# Patient Record
Sex: Female | Born: 1950
Health system: Southern US, Community
[De-identification: ages and names within clinical notes are randomized; demographics above are authoritative.]

## PROBLEM LIST (undated history)

## (undated) DIAGNOSIS — G4700 Insomnia, unspecified: Secondary | ICD-10-CM

## (undated) DIAGNOSIS — K219 Gastro-esophageal reflux disease without esophagitis: Secondary | ICD-10-CM

## (undated) DIAGNOSIS — M810 Age-related osteoporosis without current pathological fracture: Secondary | ICD-10-CM

## (undated) DIAGNOSIS — F329 Major depressive disorder, single episode, unspecified: Secondary | ICD-10-CM

## (undated) DIAGNOSIS — F32A Depression, unspecified: Secondary | ICD-10-CM

## (undated) DIAGNOSIS — J449 Chronic obstructive pulmonary disease, unspecified: Secondary | ICD-10-CM

## (undated) DIAGNOSIS — I10 Essential (primary) hypertension: Secondary | ICD-10-CM

## (undated) HISTORY — PX: BACK SURGERY: SHX140

## (undated) HISTORY — DX: Gastro-esophageal reflux disease without esophagitis: K21.9

## (undated) HISTORY — PX: COLONOSCOPY: SHX174

## (undated) HISTORY — PX: CHOLECYSTECTOMY: SHX55

## (undated) HISTORY — DX: Depression, unspecified: F32.A

## (undated) HISTORY — DX: Insomnia, unspecified: G47.00

## (undated) HISTORY — PX: APPENDECTOMY: SHX54

## (undated) HISTORY — PX: ABDOMINAL HYSTERECTOMY: SHX81

## (undated) HISTORY — DX: Essential (primary) hypertension: I10

## (undated) HISTORY — DX: Age-related osteoporosis without current pathological fracture: M81.0

## (undated) HISTORY — DX: Chronic obstructive pulmonary disease, unspecified: J44.9

---
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
## 2015-05-03 DIAGNOSIS — N200 Calculus of kidney: Secondary | ICD-10-CM

## 2015-05-03 HISTORY — DX: Calculus of kidney: N20.0

## 2015-06-12 DIAGNOSIS — N200 Calculus of kidney: Secondary | ICD-10-CM | POA: Diagnosis not present

## 2015-06-12 DIAGNOSIS — R1031 Right lower quadrant pain: Secondary | ICD-10-CM | POA: Diagnosis not present

## 2015-07-05 DIAGNOSIS — I10 Essential (primary) hypertension: Secondary | ICD-10-CM | POA: Diagnosis not present

## 2015-07-05 DIAGNOSIS — M545 Low back pain: Secondary | ICD-10-CM | POA: Diagnosis not present

## 2015-07-05 DIAGNOSIS — R251 Tremor, unspecified: Secondary | ICD-10-CM | POA: Diagnosis not present

## 2015-07-05 DIAGNOSIS — J410 Simple chronic bronchitis: Secondary | ICD-10-CM | POA: Diagnosis not present

## 2015-07-30 DIAGNOSIS — E782 Mixed hyperlipidemia: Secondary | ICD-10-CM | POA: Diagnosis not present

## 2015-07-30 DIAGNOSIS — I10 Essential (primary) hypertension: Secondary | ICD-10-CM | POA: Diagnosis not present

## 2015-08-16 DIAGNOSIS — J018 Other acute sinusitis: Secondary | ICD-10-CM | POA: Diagnosis not present

## 2015-08-28 DIAGNOSIS — B029 Zoster without complications: Secondary | ICD-10-CM | POA: Diagnosis not present

## 2015-08-28 DIAGNOSIS — E782 Mixed hyperlipidemia: Secondary | ICD-10-CM | POA: Diagnosis not present

## 2015-08-28 DIAGNOSIS — I10 Essential (primary) hypertension: Secondary | ICD-10-CM | POA: Diagnosis not present

## 2015-10-01 DIAGNOSIS — B029 Zoster without complications: Secondary | ICD-10-CM | POA: Diagnosis not present

## 2015-10-01 DIAGNOSIS — I10 Essential (primary) hypertension: Secondary | ICD-10-CM | POA: Diagnosis not present

## 2015-10-31 DIAGNOSIS — E782 Mixed hyperlipidemia: Secondary | ICD-10-CM | POA: Diagnosis not present

## 2015-10-31 DIAGNOSIS — I10 Essential (primary) hypertension: Secondary | ICD-10-CM | POA: Diagnosis not present

## 2015-11-01 DIAGNOSIS — E782 Mixed hyperlipidemia: Secondary | ICD-10-CM | POA: Diagnosis not present

## 2015-11-01 DIAGNOSIS — I10 Essential (primary) hypertension: Secondary | ICD-10-CM | POA: Diagnosis not present

## 2016-01-15 DIAGNOSIS — Z1231 Encounter for screening mammogram for malignant neoplasm of breast: Secondary | ICD-10-CM | POA: Diagnosis not present

## 2016-01-28 DIAGNOSIS — N3001 Acute cystitis with hematuria: Secondary | ICD-10-CM | POA: Diagnosis not present

## 2016-02-07 DIAGNOSIS — E782 Mixed hyperlipidemia: Secondary | ICD-10-CM | POA: Diagnosis not present

## 2016-02-07 DIAGNOSIS — R319 Hematuria, unspecified: Secondary | ICD-10-CM | POA: Diagnosis not present

## 2016-02-07 DIAGNOSIS — R3129 Other microscopic hematuria: Secondary | ICD-10-CM | POA: Diagnosis not present

## 2016-02-07 DIAGNOSIS — M545 Low back pain: Secondary | ICD-10-CM | POA: Diagnosis not present

## 2016-02-07 DIAGNOSIS — N201 Calculus of ureter: Secondary | ICD-10-CM | POA: Diagnosis not present

## 2016-02-07 DIAGNOSIS — I10 Essential (primary) hypertension: Secondary | ICD-10-CM | POA: Diagnosis not present

## 2016-02-07 DIAGNOSIS — N200 Calculus of kidney: Secondary | ICD-10-CM | POA: Diagnosis not present

## 2016-02-07 DIAGNOSIS — Z23 Encounter for immunization: Secondary | ICD-10-CM | POA: Diagnosis not present

## 2016-02-12 DIAGNOSIS — R109 Unspecified abdominal pain: Secondary | ICD-10-CM | POA: Diagnosis not present

## 2016-02-12 DIAGNOSIS — N202 Calculus of kidney with calculus of ureter: Secondary | ICD-10-CM | POA: Diagnosis not present

## 2016-02-12 DIAGNOSIS — N3289 Other specified disorders of bladder: Secondary | ICD-10-CM | POA: Diagnosis not present

## 2016-02-12 DIAGNOSIS — N200 Calculus of kidney: Secondary | ICD-10-CM | POA: Diagnosis not present

## 2016-02-19 DIAGNOSIS — N200 Calculus of kidney: Secondary | ICD-10-CM | POA: Diagnosis not present

## 2016-02-19 DIAGNOSIS — R1032 Left lower quadrant pain: Secondary | ICD-10-CM | POA: Diagnosis not present

## 2016-02-22 DIAGNOSIS — N200 Calculus of kidney: Secondary | ICD-10-CM | POA: Diagnosis not present

## 2016-02-22 DIAGNOSIS — I10 Essential (primary) hypertension: Secondary | ICD-10-CM | POA: Diagnosis not present

## 2016-02-22 DIAGNOSIS — Z79899 Other long term (current) drug therapy: Secondary | ICD-10-CM | POA: Diagnosis not present

## 2016-02-29 DIAGNOSIS — R1032 Left lower quadrant pain: Secondary | ICD-10-CM | POA: Diagnosis not present

## 2016-02-29 DIAGNOSIS — N200 Calculus of kidney: Secondary | ICD-10-CM | POA: Diagnosis not present

## 2016-04-11 DIAGNOSIS — R109 Unspecified abdominal pain: Secondary | ICD-10-CM | POA: Diagnosis not present

## 2016-04-11 DIAGNOSIS — I10 Essential (primary) hypertension: Secondary | ICD-10-CM | POA: Diagnosis not present

## 2016-04-11 DIAGNOSIS — N200 Calculus of kidney: Secondary | ICD-10-CM | POA: Diagnosis not present

## 2016-05-09 DIAGNOSIS — J441 Chronic obstructive pulmonary disease with (acute) exacerbation: Secondary | ICD-10-CM | POA: Diagnosis not present

## 2016-05-09 DIAGNOSIS — I10 Essential (primary) hypertension: Secondary | ICD-10-CM | POA: Diagnosis not present

## 2016-05-09 DIAGNOSIS — G250 Essential tremor: Secondary | ICD-10-CM | POA: Diagnosis not present

## 2016-05-09 DIAGNOSIS — Z23 Encounter for immunization: Secondary | ICD-10-CM | POA: Diagnosis not present

## 2016-05-09 DIAGNOSIS — E782 Mixed hyperlipidemia: Secondary | ICD-10-CM | POA: Diagnosis not present

## 2016-05-29 DIAGNOSIS — N201 Calculus of ureter: Secondary | ICD-10-CM | POA: Diagnosis not present

## 2016-05-29 DIAGNOSIS — R109 Unspecified abdominal pain: Secondary | ICD-10-CM | POA: Diagnosis not present

## 2016-08-15 DIAGNOSIS — E782 Mixed hyperlipidemia: Secondary | ICD-10-CM | POA: Diagnosis not present

## 2016-08-15 DIAGNOSIS — G250 Essential tremor: Secondary | ICD-10-CM | POA: Diagnosis not present

## 2016-08-15 DIAGNOSIS — I10 Essential (primary) hypertension: Secondary | ICD-10-CM | POA: Diagnosis not present

## 2016-12-11 DIAGNOSIS — Z122 Encounter for screening for malignant neoplasm of respiratory organs: Secondary | ICD-10-CM | POA: Diagnosis not present

## 2016-12-11 DIAGNOSIS — Z1231 Encounter for screening mammogram for malignant neoplasm of breast: Secondary | ICD-10-CM | POA: Diagnosis not present

## 2016-12-11 DIAGNOSIS — Z0001 Encounter for general adult medical examination with abnormal findings: Secondary | ICD-10-CM | POA: Diagnosis not present

## 2016-12-11 DIAGNOSIS — Z1211 Encounter for screening for malignant neoplasm of colon: Secondary | ICD-10-CM | POA: Diagnosis not present

## 2017-02-17 DIAGNOSIS — I10 Essential (primary) hypertension: Secondary | ICD-10-CM | POA: Diagnosis not present

## 2017-02-17 DIAGNOSIS — E782 Mixed hyperlipidemia: Secondary | ICD-10-CM | POA: Diagnosis not present

## 2017-02-17 DIAGNOSIS — Z23 Encounter for immunization: Secondary | ICD-10-CM | POA: Diagnosis not present

## 2017-03-18 LAB — HM DEXA SCAN

## 2017-04-07 DIAGNOSIS — Z1211 Encounter for screening for malignant neoplasm of colon: Secondary | ICD-10-CM | POA: Diagnosis not present

## 2017-04-09 LAB — COLOGUARD: Cologuard: NEGATIVE

## 2017-07-13 DIAGNOSIS — J9601 Acute respiratory failure with hypoxia: Secondary | ICD-10-CM | POA: Diagnosis not present

## 2017-07-13 DIAGNOSIS — J440 Chronic obstructive pulmonary disease with acute lower respiratory infection: Secondary | ICD-10-CM | POA: Diagnosis not present

## 2017-07-13 DIAGNOSIS — J1100 Influenza due to unidentified influenza virus with unspecified type of pneumonia: Secondary | ICD-10-CM | POA: Diagnosis not present

## 2017-07-13 DIAGNOSIS — J42 Unspecified chronic bronchitis: Secondary | ICD-10-CM | POA: Diagnosis not present

## 2017-07-13 DIAGNOSIS — Z79899 Other long term (current) drug therapy: Secondary | ICD-10-CM | POA: Diagnosis not present

## 2017-07-13 DIAGNOSIS — R05 Cough: Secondary | ICD-10-CM | POA: Diagnosis not present

## 2017-07-13 DIAGNOSIS — I10 Essential (primary) hypertension: Secondary | ICD-10-CM | POA: Diagnosis not present

## 2017-07-13 DIAGNOSIS — R0602 Shortness of breath: Secondary | ICD-10-CM | POA: Diagnosis not present

## 2017-07-13 DIAGNOSIS — J181 Lobar pneumonia, unspecified organism: Secondary | ICD-10-CM | POA: Diagnosis not present

## 2017-07-13 DIAGNOSIS — Z880 Allergy status to penicillin: Secondary | ICD-10-CM | POA: Diagnosis not present

## 2017-07-13 DIAGNOSIS — E785 Hyperlipidemia, unspecified: Secondary | ICD-10-CM | POA: Diagnosis not present

## 2017-07-15 DIAGNOSIS — J1100 Influenza due to unidentified influenza virus with unspecified type of pneumonia: Secondary | ICD-10-CM | POA: Diagnosis not present

## 2017-07-15 DIAGNOSIS — J9601 Acute respiratory failure with hypoxia: Secondary | ICD-10-CM | POA: Diagnosis not present

## 2017-07-15 DIAGNOSIS — J181 Lobar pneumonia, unspecified organism: Secondary | ICD-10-CM | POA: Diagnosis not present

## 2017-07-20 DIAGNOSIS — J441 Chronic obstructive pulmonary disease with (acute) exacerbation: Secondary | ICD-10-CM | POA: Diagnosis not present

## 2017-08-03 DIAGNOSIS — E782 Mixed hyperlipidemia: Secondary | ICD-10-CM | POA: Diagnosis not present

## 2017-08-03 DIAGNOSIS — J410 Simple chronic bronchitis: Secondary | ICD-10-CM | POA: Diagnosis not present

## 2017-08-03 DIAGNOSIS — R0902 Hypoxemia: Secondary | ICD-10-CM | POA: Diagnosis not present

## 2017-08-03 DIAGNOSIS — J168 Pneumonia due to other specified infectious organisms: Secondary | ICD-10-CM | POA: Diagnosis not present

## 2017-08-03 DIAGNOSIS — I10 Essential (primary) hypertension: Secondary | ICD-10-CM | POA: Diagnosis not present

## 2017-08-10 DIAGNOSIS — J168 Pneumonia due to other specified infectious organisms: Secondary | ICD-10-CM | POA: Diagnosis not present

## 2017-08-10 DIAGNOSIS — J189 Pneumonia, unspecified organism: Secondary | ICD-10-CM | POA: Diagnosis not present

## 2017-08-17 DIAGNOSIS — J410 Simple chronic bronchitis: Secondary | ICD-10-CM | POA: Diagnosis not present

## 2017-09-12 DIAGNOSIS — J42 Unspecified chronic bronchitis: Secondary | ICD-10-CM | POA: Diagnosis not present

## 2017-09-14 DIAGNOSIS — L237 Allergic contact dermatitis due to plants, except food: Secondary | ICD-10-CM | POA: Diagnosis not present

## 2017-09-21 DIAGNOSIS — J410 Simple chronic bronchitis: Secondary | ICD-10-CM | POA: Diagnosis not present

## 2017-10-12 DIAGNOSIS — J42 Unspecified chronic bronchitis: Secondary | ICD-10-CM | POA: Diagnosis not present

## 2017-11-05 DIAGNOSIS — I10 Essential (primary) hypertension: Secondary | ICD-10-CM | POA: Diagnosis not present

## 2017-11-05 DIAGNOSIS — M7918 Myalgia, other site: Secondary | ICD-10-CM | POA: Diagnosis not present

## 2017-11-05 DIAGNOSIS — J410 Simple chronic bronchitis: Secondary | ICD-10-CM | POA: Diagnosis not present

## 2017-11-05 DIAGNOSIS — E782 Mixed hyperlipidemia: Secondary | ICD-10-CM | POA: Diagnosis not present

## 2017-11-12 DIAGNOSIS — J42 Unspecified chronic bronchitis: Secondary | ICD-10-CM | POA: Diagnosis not present

## 2017-12-02 DIAGNOSIS — J060 Acute laryngopharyngitis: Secondary | ICD-10-CM | POA: Diagnosis not present

## 2017-12-12 DIAGNOSIS — J42 Unspecified chronic bronchitis: Secondary | ICD-10-CM | POA: Diagnosis not present

## 2018-01-12 DIAGNOSIS — J42 Unspecified chronic bronchitis: Secondary | ICD-10-CM | POA: Diagnosis not present

## 2018-02-08 DIAGNOSIS — Z0001 Encounter for general adult medical examination with abnormal findings: Secondary | ICD-10-CM | POA: Diagnosis not present

## 2018-02-08 DIAGNOSIS — E782 Mixed hyperlipidemia: Secondary | ICD-10-CM | POA: Diagnosis not present

## 2018-02-08 DIAGNOSIS — Z6821 Body mass index (BMI) 21.0-21.9, adult: Secondary | ICD-10-CM | POA: Diagnosis not present

## 2018-02-08 DIAGNOSIS — I10 Essential (primary) hypertension: Secondary | ICD-10-CM | POA: Diagnosis not present

## 2018-02-08 DIAGNOSIS — M7918 Myalgia, other site: Secondary | ICD-10-CM | POA: Diagnosis not present

## 2018-02-12 DIAGNOSIS — J42 Unspecified chronic bronchitis: Secondary | ICD-10-CM | POA: Diagnosis not present

## 2018-02-24 DIAGNOSIS — Z87891 Personal history of nicotine dependence: Secondary | ICD-10-CM | POA: Diagnosis not present

## 2018-02-24 DIAGNOSIS — J439 Emphysema, unspecified: Secondary | ICD-10-CM | POA: Diagnosis not present

## 2018-03-09 DIAGNOSIS — E782 Mixed hyperlipidemia: Secondary | ICD-10-CM | POA: Diagnosis not present

## 2018-03-09 DIAGNOSIS — I10 Essential (primary) hypertension: Secondary | ICD-10-CM | POA: Diagnosis not present

## 2018-03-09 DIAGNOSIS — J410 Simple chronic bronchitis: Secondary | ICD-10-CM | POA: Diagnosis not present

## 2018-03-14 DIAGNOSIS — J42 Unspecified chronic bronchitis: Secondary | ICD-10-CM | POA: Diagnosis not present

## 2018-03-18 DIAGNOSIS — H524 Presbyopia: Secondary | ICD-10-CM | POA: Diagnosis not present

## 2018-03-18 DIAGNOSIS — H2513 Age-related nuclear cataract, bilateral: Secondary | ICD-10-CM | POA: Diagnosis not present

## 2018-04-09 DIAGNOSIS — E782 Mixed hyperlipidemia: Secondary | ICD-10-CM | POA: Diagnosis not present

## 2018-04-09 DIAGNOSIS — J410 Simple chronic bronchitis: Secondary | ICD-10-CM | POA: Diagnosis not present

## 2018-04-09 DIAGNOSIS — I10 Essential (primary) hypertension: Secondary | ICD-10-CM | POA: Diagnosis not present

## 2018-04-14 DIAGNOSIS — J42 Unspecified chronic bronchitis: Secondary | ICD-10-CM | POA: Diagnosis not present

## 2018-05-07 DIAGNOSIS — I10 Essential (primary) hypertension: Secondary | ICD-10-CM | POA: Diagnosis not present

## 2018-05-07 DIAGNOSIS — G4709 Other insomnia: Secondary | ICD-10-CM | POA: Diagnosis not present

## 2018-05-07 DIAGNOSIS — J410 Simple chronic bronchitis: Secondary | ICD-10-CM | POA: Diagnosis not present

## 2018-05-14 DIAGNOSIS — J42 Unspecified chronic bronchitis: Secondary | ICD-10-CM | POA: Diagnosis not present

## 2018-06-10 DIAGNOSIS — J441 Chronic obstructive pulmonary disease with (acute) exacerbation: Secondary | ICD-10-CM | POA: Diagnosis not present

## 2018-06-11 DIAGNOSIS — Z1231 Encounter for screening mammogram for malignant neoplasm of breast: Secondary | ICD-10-CM | POA: Diagnosis not present

## 2018-07-02 DIAGNOSIS — J018 Other acute sinusitis: Secondary | ICD-10-CM | POA: Diagnosis not present

## 2018-07-12 DIAGNOSIS — R631 Polydipsia: Secondary | ICD-10-CM | POA: Diagnosis not present

## 2018-07-12 DIAGNOSIS — I10 Essential (primary) hypertension: Secondary | ICD-10-CM | POA: Diagnosis not present

## 2018-07-12 DIAGNOSIS — J410 Simple chronic bronchitis: Secondary | ICD-10-CM | POA: Diagnosis not present

## 2018-07-12 DIAGNOSIS — E782 Mixed hyperlipidemia: Secondary | ICD-10-CM | POA: Diagnosis not present

## 2018-07-12 DIAGNOSIS — R05 Cough: Secondary | ICD-10-CM | POA: Diagnosis not present

## 2018-07-26 DIAGNOSIS — R05 Cough: Secondary | ICD-10-CM | POA: Diagnosis not present

## 2018-07-26 DIAGNOSIS — J439 Emphysema, unspecified: Secondary | ICD-10-CM | POA: Diagnosis not present

## 2018-07-26 DIAGNOSIS — R911 Solitary pulmonary nodule: Secondary | ICD-10-CM | POA: Diagnosis not present

## 2018-08-09 DIAGNOSIS — I10 Essential (primary) hypertension: Secondary | ICD-10-CM | POA: Diagnosis not present

## 2018-08-09 DIAGNOSIS — D72828 Other elevated white blood cell count: Secondary | ICD-10-CM | POA: Diagnosis not present

## 2018-08-09 DIAGNOSIS — N342 Other urethritis: Secondary | ICD-10-CM | POA: Diagnosis not present

## 2018-08-09 DIAGNOSIS — N3000 Acute cystitis without hematuria: Secondary | ICD-10-CM | POA: Diagnosis not present

## 2018-08-12 DIAGNOSIS — N201 Calculus of ureter: Secondary | ICD-10-CM | POA: Diagnosis not present

## 2018-08-12 DIAGNOSIS — N132 Hydronephrosis with renal and ureteral calculous obstruction: Secondary | ICD-10-CM | POA: Diagnosis not present

## 2018-08-12 DIAGNOSIS — N1330 Unspecified hydronephrosis: Secondary | ICD-10-CM | POA: Diagnosis not present

## 2018-08-17 DIAGNOSIS — Z87442 Personal history of urinary calculi: Secondary | ICD-10-CM | POA: Diagnosis not present

## 2018-09-16 DIAGNOSIS — N202 Calculus of kidney with calculus of ureter: Secondary | ICD-10-CM | POA: Diagnosis not present

## 2018-09-16 DIAGNOSIS — N201 Calculus of ureter: Secondary | ICD-10-CM | POA: Diagnosis not present

## 2018-09-16 DIAGNOSIS — N134 Hydroureter: Secondary | ICD-10-CM | POA: Diagnosis not present

## 2018-10-11 DIAGNOSIS — E782 Mixed hyperlipidemia: Secondary | ICD-10-CM | POA: Diagnosis not present

## 2018-10-11 DIAGNOSIS — J410 Simple chronic bronchitis: Secondary | ICD-10-CM | POA: Diagnosis not present

## 2018-10-11 DIAGNOSIS — I10 Essential (primary) hypertension: Secondary | ICD-10-CM | POA: Diagnosis not present

## 2018-11-12 DIAGNOSIS — N201 Calculus of ureter: Secondary | ICD-10-CM | POA: Diagnosis not present

## 2018-11-12 DIAGNOSIS — J449 Chronic obstructive pulmonary disease, unspecified: Secondary | ICD-10-CM | POA: Diagnosis not present

## 2018-11-12 DIAGNOSIS — Z7982 Long term (current) use of aspirin: Secondary | ICD-10-CM | POA: Diagnosis not present

## 2018-11-26 DIAGNOSIS — D72829 Elevated white blood cell count, unspecified: Secondary | ICD-10-CM | POA: Diagnosis not present

## 2018-11-26 DIAGNOSIS — R918 Other nonspecific abnormal finding of lung field: Secondary | ICD-10-CM | POA: Diagnosis not present

## 2018-11-26 DIAGNOSIS — D751 Secondary polycythemia: Secondary | ICD-10-CM | POA: Diagnosis not present

## 2019-01-28 DIAGNOSIS — E782 Mixed hyperlipidemia: Secondary | ICD-10-CM | POA: Diagnosis not present

## 2019-01-28 DIAGNOSIS — J410 Simple chronic bronchitis: Secondary | ICD-10-CM | POA: Diagnosis not present

## 2019-01-28 DIAGNOSIS — I10 Essential (primary) hypertension: Secondary | ICD-10-CM | POA: Diagnosis not present

## 2019-02-04 DIAGNOSIS — Z87442 Personal history of urinary calculi: Secondary | ICD-10-CM

## 2019-02-04 HISTORY — DX: Personal history of urinary calculi: Z87.442

## 2019-02-10 DIAGNOSIS — Z09 Encounter for follow-up examination after completed treatment for conditions other than malignant neoplasm: Secondary | ICD-10-CM | POA: Diagnosis not present

## 2019-02-10 DIAGNOSIS — Z87442 Personal history of urinary calculi: Secondary | ICD-10-CM | POA: Diagnosis not present

## 2019-03-02 DIAGNOSIS — F5101 Primary insomnia: Secondary | ICD-10-CM | POA: Diagnosis not present

## 2019-04-07 DIAGNOSIS — F5101 Primary insomnia: Secondary | ICD-10-CM | POA: Diagnosis not present

## 2019-04-07 DIAGNOSIS — E441 Mild protein-calorie malnutrition: Secondary | ICD-10-CM | POA: Diagnosis not present

## 2019-05-05 DIAGNOSIS — E782 Mixed hyperlipidemia: Secondary | ICD-10-CM | POA: Diagnosis not present

## 2019-05-05 DIAGNOSIS — J410 Simple chronic bronchitis: Secondary | ICD-10-CM | POA: Diagnosis not present

## 2019-05-05 DIAGNOSIS — I10 Essential (primary) hypertension: Secondary | ICD-10-CM | POA: Diagnosis not present

## 2019-07-18 ENCOUNTER — Other Ambulatory Visit: Payer: Self-pay | Admitting: Family Medicine

## 2019-07-18 ENCOUNTER — Other Ambulatory Visit: Payer: Self-pay

## 2019-07-18 MED ORDER — PROPRANOLOL HCL ER 120 MG PO CP24: 120.0000 mg | ORAL_CAPSULE | Freq: Every day | ORAL | 1 refills | Status: DC

## 2019-07-18 MED ORDER — HYDRALAZINE HCL 100 MG PO TABS: 100.0000 mg | ORAL_TABLET | Freq: Two times a day (BID) | ORAL | 1 refills | Status: DC

## 2019-07-19 ENCOUNTER — Other Ambulatory Visit: Payer: Self-pay

## 2019-07-19 MED ORDER — PROPRANOLOL HCL ER 120 MG PO CP24: 120.0000 mg | ORAL_CAPSULE | Freq: Every day | ORAL | 1 refills | Status: DC

## 2019-07-19 MED ORDER — HYDRALAZINE HCL 100 MG PO TABS: 100.0000 mg | ORAL_TABLET | Freq: Two times a day (BID) | ORAL | 1 refills | Status: DC

## 2019-08-12 DIAGNOSIS — Z09 Encounter for follow-up examination after completed treatment for conditions other than malignant neoplasm: Secondary | ICD-10-CM | POA: Diagnosis not present

## 2019-08-12 DIAGNOSIS — Z87442 Personal history of urinary calculi: Secondary | ICD-10-CM | POA: Diagnosis not present

## 2019-08-12 DIAGNOSIS — N200 Calculus of kidney: Secondary | ICD-10-CM | POA: Diagnosis not present

## 2019-08-12 DIAGNOSIS — N281 Cyst of kidney, acquired: Secondary | ICD-10-CM | POA: Diagnosis not present

## 2019-08-29 DIAGNOSIS — Z1231 Encounter for screening mammogram for malignant neoplasm of breast: Secondary | ICD-10-CM | POA: Diagnosis not present

## 2019-09-11 ENCOUNTER — Other Ambulatory Visit: Payer: Self-pay | Admitting: Family Medicine

## 2019-09-12 ENCOUNTER — Other Ambulatory Visit: Payer: Self-pay

## 2019-09-12 MED ORDER — MIRTAZAPINE 7.5 MG PO TABS: 7.5000 mg | ORAL_TABLET | Freq: Every day | ORAL | 1 refills | Status: DC

## 2019-09-14 ENCOUNTER — Encounter: Payer: Self-pay | Admitting: Family Medicine

## 2019-11-02 ENCOUNTER — Encounter: Payer: Self-pay | Admitting: Family Medicine

## 2019-11-02 DIAGNOSIS — I10 Essential (primary) hypertension: Secondary | ICD-10-CM

## 2019-11-02 DIAGNOSIS — E782 Mixed hyperlipidemia: Secondary | ICD-10-CM | POA: Insufficient documentation

## 2019-11-02 HISTORY — DX: Essential (primary) hypertension: I10

## 2019-11-02 HISTORY — DX: Mixed hyperlipidemia: E78.2

## 2019-11-02 NOTE — Progress Notes (Signed)
Subjective:  Patient ID: Misty Evans, female    DOB: 16-Jul-1950  Age: 69 y.o. MRN: [id number]  Chief Complaint  Patient presents with  . Hypertension  . Hyperlipidemia  . COPD    HPI  Pt presents with hyperlipidemia.  Current treatment includes pravastatin 10 mg once daily and a low cholesterol/low fat diet.  Compliance with treatment has been good; she takes her medication as directed, maintains her low cholesterol diet, and follows up as directed.  Not exercising. She denies experiencing any hypercholesterolemia related symptoms.      Pt presents for follow up of hypertension.  Date of diagnosis early 33s.  Her current cardiac medication regimen includes Irbesartan, propranolol, and Hydralazine.  She is tolerating the medication well without side effects.  Compliance with treatment has been good; she takes her medication as directed and follows up as directed.      In regard to the chronic obstructive pulmonary disease, unspecified, the duration of COPD has been one year.  Patient has an established diagnosis of COPD.  Currently taking trelegy. She has a smoker's cough.  There are no associated symptoms of dyspnea or wheezing.  Yarnell currently smokes 1/2 pack per day.  Pt is taking Trelegy and Albuterol.   Past Medical History:  Diagnosis Date  . Depression   . Insomnia   . Osteoporosis   . Renal stones 05/2015    Family History  Problem Relation Age of Onset  . Hypertension Mother   . Cancer Father        esophageal  . Hypertension Father   . Hypertension Maternal Grandmother   . Heart attack Maternal Grandmother   . Diabetes Paternal Grandmother   . Hypertension Paternal Grandmother    Social History   Socioeconomic History  . Marital status: Married    Spouse name: Not on file  . Number of children: 2  . Years of education: Not on file  . Highest education level: Not on file  Occupational History  . Not on file  Tobacco Use  . Smoking status:  Current Every Day Smoker    Packs/day: 0.50    Types: Cigarettes  . Smokeless tobacco: Never Used  Substance and Sexual Activity  . Alcohol use: Not Currently  . Drug use: Never  . Sexual activity: Not on file  Other Topics Concern  . Not on file  Social History Narrative  . Not on file   Social Determinants of Health   Financial Resource Strain:   . Difficulty of Paying Living Expenses:   Food Insecurity:   . Worried About Charity fundraiser in the Last Year:   . Arboriculturist in the Last Year:   Transportation Needs:   . Film/video editor (Medical):   Marland Kitchen Lack of Transportation (Non-Medical):   Physical Activity:   . Days of Exercise per Week:   . Minutes of Exercise per Session:   Stress:   . Feeling of Stress :   Social Connections:   . Frequency of Communication with Friends and Family:   . Frequency of Social Gatherings with Friends and Family:   . Attends Religious Services:   . Active Member of Clubs or Organizations:   . Attends Archivist Meetings:   Marland Kitchen Marital Status:     Review of Systems  Constitutional: Positive for fatigue and unexpected weight change. Negative for chills and fever.  HENT: Negative for congestion, ear pain, rhinorrhea and sore throat.   Respiratory:  Positive for shortness of breath. Negative for cough.   Cardiovascular: Negative for chest pain.  Gastrointestinal: Negative for abdominal pain, constipation, diarrhea, nausea and vomiting.  Genitourinary: Negative for dysuria and urgency.  Musculoskeletal: Positive for back pain. Negative for myalgias.  Allergic/Immunologic: Positive for environmental allergies.  Neurological: Negative for dizziness, weakness, light-headedness and headaches.  Psychiatric/Behavioral: Positive for dysphoric mood and sleep disturbance (Increase sleep. Decreased appetite). Negative for suicidal ideas. The patient is not nervous/anxious.     Objective:  BP 140/90   Pulse 70   Temp 97.6 F  (36.4 C)   Ht 5' (1.524 m)   Wt 91 lb (41.3 kg)   SpO2 95%   BMI 17.77 kg/m   BP/Weight [id number]  Systolic BP [id number]  Diastolic BP 90  Wt. (Lbs) 91  BMI 17.77    Physical Exam Vitals reviewed.  Constitutional:      Appearance: Normal appearance. She is normal weight.  Cardiovascular:     Rate and Rhythm: Normal rate and regular rhythm.     Pulses: Normal pulses.     Heart sounds: Normal heart sounds.  Pulmonary:     Effort: Pulmonary effort is normal. No respiratory distress.     Breath sounds: Normal breath sounds.  Abdominal:     General: Abdomen is flat. Bowel sounds are normal.     Palpations: Abdomen is soft.     Tenderness: There is no abdominal tenderness.  Neurological:     Mental Status: She is alert and oriented to person, place, and time.  Psychiatric:        Mood and Affect: Mood normal.        Behavior: Behavior normal.    Diabetic Foot Exam - Simple   No data filed       Lab Results  Component Value Date   WBC 8.8 11/03/2019   HGB 16.3 (H) 11/03/2019   HCT 50.1 (H) 11/03/2019   PLT 262 11/03/2019   GLUCOSE 74 11/03/2019   CHOL 148 11/03/2019   TRIG 100 11/03/2019   HDL 50 11/03/2019   LDLCALC 80 11/03/2019   ALT 7 11/03/2019   AST 17 11/03/2019   NA 144 11/03/2019   K 3.6 11/03/2019   CL 101 11/03/2019   CREATININE 0.67 11/03/2019   BUN 16 11/03/2019   CO2 29 11/03/2019      Assessment & Plan:  1. Essential hypertension Fairly well controlled.  No changes to medicines.  Continue to work on eating a healthy diet and exercise.  Labs drawn today.  - CBC with Differential/Platelet - Comprehensive metabolic panel - Ambulatory referral to Chronic Care Management Services  2. Mixed hyperlipidemia - Lipid panel - Ambulatory referral to Chronic Care Management Services  3. Chronic obstructive bronchitis ([place]) Breztri sample given. 2 puffs twice a day. Hope she will qualify for pt assistance. - Ambulatory referral to Chronic Care  Management Services  4. Mild protein-calorie malnutrition (HCC) Continue protein shake. Weigh daily.  5. Depression, major, recurrent, mild (HCC) Stat on prozac 10 mg once daily and trazodone 50 mg one at night. Pt to see pharmacist and if she is not doing well, would consider increase in dose.   Follow-up: Return in about 3 months (around 02/03/2020) for depression.  An After Visit Summary was printed and given to the patient.  Rochel Brome [facility] [phone]

## 2019-11-03 ENCOUNTER — Telehealth: Payer: Self-pay | Admitting: Family Medicine

## 2019-11-03 ENCOUNTER — Encounter: Payer: Self-pay | Admitting: Family Medicine

## 2019-11-03 ENCOUNTER — Other Ambulatory Visit: Payer: Self-pay

## 2019-11-03 ENCOUNTER — Ambulatory Visit (INDEPENDENT_AMBULATORY_CARE_PROVIDER_SITE_OTHER): Payer: Medicare Other | Admitting: Family Medicine

## 2019-11-03 VITALS — BP 140/90 | HR 70 | Temp 97.6°F | Ht 60.0 in | Wt 91.0 lb

## 2019-11-03 DIAGNOSIS — E441 Mild protein-calorie malnutrition: Secondary | ICD-10-CM

## 2019-11-03 DIAGNOSIS — I10 Essential (primary) hypertension: Secondary | ICD-10-CM

## 2019-11-03 DIAGNOSIS — E782 Mixed hyperlipidemia: Secondary | ICD-10-CM | POA: Diagnosis not present

## 2019-11-03 DIAGNOSIS — J449 Chronic obstructive pulmonary disease, unspecified: Secondary | ICD-10-CM | POA: Diagnosis not present

## 2019-11-03 DIAGNOSIS — F330 Major depressive disorder, recurrent, mild: Secondary | ICD-10-CM

## 2019-11-03 MED ORDER — FLUOXETINE HCL 10 MG PO TABS: 10.0000 mg | ORAL_TABLET | Freq: Every day | ORAL | 3 refills | Status: DC

## 2019-11-03 MED ORDER — TRAZODONE HCL 50 MG PO TABS: 50.0000 mg | ORAL_TABLET | Freq: Every evening | ORAL | 1 refills | Status: DC | PRN

## 2019-11-03 NOTE — Progress Notes (Signed)
°  Chronic Care Management   Outreach Note  11/03/2019 Name: Janelli Rittenberg MRN: [id number] DOB: 1950/06/18  Referred by: Rochel Brome, MD Reason for referral : No chief complaint on file.   An unsuccessful telephone outreach was attempted today. The patient was referred to the pharmacist for assistance with care management and care coordination.   This note is not being shared with the patient for the following reason: To respect privacy (The patient or proxy has requested that the information not be shared).  Follow Up Plan:   Earney Hamburg Upstream Scheduler

## 2019-11-03 NOTE — Patient Instructions (Addendum)
Weigh Daily. If continues to lose weight, please call office back.  Start on prozac 10 mg once daily for depression/anxiety.

## 2019-11-04 LAB — CBC WITH DIFFERENTIAL/PLATELET
Basophils Absolute: 0.1 10*3/uL (ref 0.0–0.2)
Basos: 1 %
EOS (ABSOLUTE): 0.1 10*3/uL (ref 0.0–0.4)
Eos: 1 %
Hematocrit: 50.1 % — ABNORMAL HIGH (ref 34.0–46.6)
Hemoglobin: 16.3 g/dL — ABNORMAL HIGH (ref 11.1–15.9)
Immature Grans (Abs): 0.1 10*3/uL (ref 0.0–0.1)
Immature Granulocytes: 1 %
Lymphocytes Absolute: 1.5 10*3/uL (ref 0.7–3.1)
Lymphs: 17 %
MCH: 29.9 pg (ref 26.6–33.0)
MCHC: 32.5 g/dL (ref 31.5–35.7)
MCV: 92 fL (ref 79–97)
Monocytes Absolute: 0.5 10*3/uL (ref 0.1–0.9)
Monocytes: 6 %
Neutrophils Absolute: 6.5 10*3/uL (ref 1.4–7.0)
Neutrophils: 74 %
Platelets: 262 10*3/uL (ref 150–450)
RBC: 5.46 x10E6/uL — ABNORMAL HIGH (ref 3.77–5.28)
RDW: 14.1 % (ref 11.7–15.4)
WBC: 8.8 10*3/uL (ref 3.4–10.8)

## 2019-11-04 LAB — COMPREHENSIVE METABOLIC PANEL
ALT: 7 IU/L (ref 0–32)
AST: 17 IU/L (ref 0–40)
Albumin/Globulin Ratio: 1.4 (ref 1.2–2.2)
Albumin: 3.9 g/dL (ref 3.8–4.8)
Alkaline Phosphatase: 72 IU/L (ref 48–121)
BUN/Creatinine Ratio: 24 (ref 12–28)
BUN: 16 mg/dL (ref 8–27)
Bilirubin Total: 1.2 mg/dL (ref 0.0–1.2)
CO2: 29 mmol/L (ref 20–29)
Calcium: 9.5 mg/dL (ref 8.7–10.3)
Chloride: 101 mmol/L (ref 96–106)
Creatinine, Ser: 0.67 mg/dL (ref 0.57–1.00)
GFR calc Af Amer: 103 mL/min/{1.73_m2} (ref >59)
GFR calc non Af Amer: 89 mL/min/{1.73_m2} (ref >59)
Globulin, Total: 2.7 g/dL (ref 1.5–4.5)
Glucose: 74 mg/dL (ref 65–99)
Potassium: 3.6 mmol/L (ref 3.5–5.2)
Sodium: 144 mmol/L (ref 134–144)
Total Protein: 6.6 g/dL (ref 6.0–8.5)

## 2019-11-04 LAB — LIPID PANEL
Chol/HDL Ratio: 3 ratio (ref 0.0–4.4)
Cholesterol, Total: 148 mg/dL (ref 100–199)
HDL: 50 mg/dL (ref >39)
LDL Chol Calc (NIH): 80 mg/dL (ref 0–99)
Triglycerides: 100 mg/dL (ref 0–149)
VLDL Cholesterol Cal: 18 mg/dL (ref 5–40)

## 2019-11-04 LAB — CARDIOVASCULAR RISK ASSESSMENT

## 2019-11-09 ENCOUNTER — Telehealth: Payer: Self-pay | Admitting: Family Medicine

## 2019-11-09 NOTE — Progress Notes (Signed)
  Chronic Care Management   Note  11/09/2019 Name: Misty Evans MRN: [id number] DOB: May 05, 1951  Misty Evans is a 69 y.o. year old female who is a primary care patient of Cox, Kirsten, MD. I reached out to [name] by phone today in response to a referral sent by Misty Evans's PCP, Cox, Kirsten, MD.   Misty Evans was given information about Chronic Care Management services today including:  1. CCM service includes personalized support from designated clinical staff supervised by her physician, including individualized plan of care and coordination with other care providers 2. 24/7 contact phone numbers for assistance for urgent and routine care needs. 3. Service will only be billed when office clinical staff spend 20 minutes or more in a month to coordinate care. 4. Only one practitioner may furnish and bill the service in a calendar month. 5. The patient may stop CCM services at any time (effective at the end of the month) by phone call to the office staff.   Patient agreed to services and verbal consent obtained.   This note is not being shared with the patient for the following reason: To respect privacy (The patient or proxy has requested that the information not be shared).  Follow up plan:   Earney Hamburg Upstream Scheduler

## 2019-11-12 ENCOUNTER — Encounter: Payer: Self-pay | Admitting: Family Medicine

## 2019-11-12 DIAGNOSIS — J449 Chronic obstructive pulmonary disease, unspecified: Secondary | ICD-10-CM | POA: Insufficient documentation

## 2019-11-12 DIAGNOSIS — J441 Chronic obstructive pulmonary disease with (acute) exacerbation: Secondary | ICD-10-CM | POA: Insufficient documentation

## 2019-11-12 DIAGNOSIS — F330 Major depressive disorder, recurrent, mild: Secondary | ICD-10-CM

## 2019-11-12 DIAGNOSIS — E441 Mild protein-calorie malnutrition: Secondary | ICD-10-CM

## 2019-11-12 HISTORY — DX: Chronic obstructive pulmonary disease with (acute) exacerbation: J44.1

## 2019-11-12 HISTORY — DX: Mild protein-calorie malnutrition: E44.1

## 2019-11-12 HISTORY — DX: Major depressive disorder, recurrent, mild: F33.0

## 2019-12-14 NOTE — Chronic Care Management (AMB) (Signed)
Chronic Care Management Pharmacy  Name: Misty Evans  MRN: [id number] DOB: 10-05-1950  Chief Complaint/ HPI  Misty Evans,  69 y.o. , female presents for their Initial CCM visit with the clinical pharmacist via telephone due to COVID-19 Pandemic.  PCP : Rochel Brome, MD  Their chronic conditions include: HTN, chronic obstructive bronchitis, HLD, depression, mild protein-calorie malnutrition.  Office Visits: 11/03/2019 - Breztri samples given due to need for patient assistance.  Consult Visit: 08/12/2019 - Urology - ultrasound indicates calculus of kidney. Follow-up in 6 months.   Medications: Outpatient Encounter Medications as of 12/20/2019  Medication Sig  . albuterol (ACCUNEB) 1.25 MG/3ML nebulizer solution Take 1 ampule by nebulization every 6 (six) hours as needed for wheezing.  Marland Kitchen aspirin 81 MG chewable tablet Chew 81 mg by mouth daily.  . Biotin 10000 MCG TABS Take by mouth daily.   Marland Kitchen FLUoxetine (PROZAC) 10 MG tablet Take 1 tablet (10 mg total) by mouth daily.  . hydrALAZINE (APRESOLINE) 100 MG tablet TAKE 1 TABLET BY MOUTH  TWICE DAILY WITH FOOD  . ibuprofen (ADVIL) 200 MG tablet Take 800 mg by mouth every 6 (six) hours as needed. Takes 4 tablets as needed for back pain  . irbesartan (AVAPRO) 300 MG tablet TAKE 1 TABLET BY MOUTH ONCE DAILY  . pravastatin (PRAVACHOL) 10 MG tablet TAKE 1 TABLET BY MOUTH ONCE DAILY  . propranolol ER (INDERAL LA) 120 MG 24 hr capsule TAKE 1 CAPSULE BY MOUTH  DAILY  . traZODone (DESYREL) 50 MG tablet Take 1 tablet (50 mg total) by mouth at bedtime as needed.   No facility-administered encounter medications on file as of 12/20/2019.   Allergies  Allergen Reactions  . Penicillin G Rash  . Sulfamethoxazole Rash    SDOH Screenings   Alcohol Screen:   . Last Alcohol Screening Score (AUDIT):   Depression (PHQ2-9):   . PHQ-2 Score:   Financial Resource Strain:   . Difficulty of Paying Living Expenses:   Food Insecurity:    . Worried About Charity fundraiser in the Last Year:   . [place] in the Last Year:   Housing:   . Last Housing Risk Score:   Physical Activity:   . Days of Exercise per Week:   . Minutes of Exercise per Session:   Social Connections:   . Frequency of Communication with Friends and Family:   . Frequency of Social Gatherings with Friends and Family:   . Attends Religious Services:   . Active Member of Clubs or Organizations:   . Attends Archivist Meetings:   Marland Kitchen Marital Status:   Stress:   . Feeling of Stress :   Tobacco Use: High Risk  . Smoking Tobacco Use: Current Every Day Smoker  . Smokeless Tobacco Use: Never Used  Transportation Needs:   . Film/video editor (Medical):   Marland Kitchen Lack of Transportation (Non-Medical):     Current Diagnosis/Assessment:  Goals Addressed            This Visit's Progress   . Pharmacy Care Plan       CARE PLAN ENTRY (see longitudinal plan of care for additional care plan information)  Current Barriers:  . Chronic Disease Management support, education, and care coordination needs related to Hypertension, Hyperlipidemia, and COPD   Hypertension BP Readings from Last 3 Encounters:  11/03/19 140/90   . Pharmacist Clinical Goal(s): o Over the next 90 days, patient will work with PharmD and providers  to maintain BP goal <140/90 . Current regimen:  o Hydralazine 100 mg twice daily with food o Irbesartan 300 mg daily o Propanolol ER 120 mg daily . Interventions: o Discussed diet and ways to incorporate nutrients while limited appetite.  . Patient self care activities - Over the next 90 days, patient will: o Check BP if symptomatic, document, and provide at future appointments o Ensure daily salt intake < 2300 mg/day  Hyperlipidemia Lab Results  Component Value Date/Time   LDLCALC 80 11/03/2019 11:52 AM   . Pharmacist Clinical Goal(s): o Over the next 90 days, patient will work with PharmD and providers to maintain  LDL goal < 70 . Current regimen:  o Pravastatin 10 mg daily o Aspirin 81 mg daily . Interventions: o Due to patient's high ASCVD risk (21.9%), she would benefit from a high intensity statin.  . Patient self care activities - Over the next 90 days, patient will: o Continue to take medication as prescribed.   COPD  . Pharmacist Clinical Goal(s) o Over the next 90 days, patient will work with PharmD and providers to manage COPD.  . Current regimen:  o Breztril 2 puffs twice daily o Albuterol inhaler prn  . Interventions: o Pharmacist will work to improve access to [employer] through [employer].  o Pharmacist coordinated prescription for albuterol rescue inhaler to be submitted to mail order.  o Pharmacist determining smoking cessation products available under insurance. . Patient self care activities - Over the next 90 days, patient will: o Reach out to [employer] for smoking cessation help.  o Work to use albuterol prn for shortness of breath and wheezing once arrived from [employer].   Medication management . Pharmacist Clinical Goal(s): o Over the next 90 days, patient will work with PharmD and providers to maintain optimal medication adherence . Current pharmacy: [facility] . Interventions o Comprehensive medication review performed. o Continue current medication management strategy . Patient self care activities - Over the next 90 days, patient will: o Focus on medication adherence by continuing to use pill box o Take medications as prescribed o Report any questions or concerns to PharmD and/or provider(s)  Initial goal documentation        COPD / Asthma / Tobacco   Last spirometry score: 62.1% 08/2017  Gold Grade: Gold 2 (FEV1 50-79%)  Eosinophil count:  No results found for: EOSPCT%                               Eos (Absolute):  Lab Results  Component Value Date/Time   EOSABS 0.1 11/03/2019 11:52 AM    Tobacco Status:  Social History   Tobacco  Use  Smoking Status Current Every Day Smoker  . Packs/day: 0.50  . Types: Cigarettes  Smokeless Tobacco Never Used    Patient has failed these meds in past: trelegy inhaler samples Patient is currently uncontrolled on the following medications:   Albuterol 1.25 mg/3 ml qid prn for nebulizer Using maintenance inhaler regularly? No Frequency of rescue inhaler use:  never  We discussed:  proper inhaler technique. Patient had a spot on her lung on last scan. She has not been to a follow-up scan since that time. She is concerned about this. She is smoking 1 pack of cigarettes every 2 days right now. She would like to try to quit smoking but is scared to try Chantix. We discussed patches, gum and bupropion as options  to consider. Cost is a concern with the patches and gum. She thinks that she has tried and failed bupropion. She would love to quit but it has been so long that it will be difficult. She has smoked for 53 years.  Becomes winded carrying in groceries. Gets out of breath in about 50 feet. She is not currently taking a maintenance inhaler due to cost.  She does not have a rescue inhaler for SOB/Wheezing.   She has a nebulizer but tries to use it rarely to avoid becoming dependent. She will use it when she wheezes really bad. Discussed using it when she has shortness of breathing.   Plan  Pharmacist applying for patient assistance on Breztri. Pharmacist coordinating albuterol filled through mail order due to affordability. Pharmacist working on affordability issues with nicotine patches.    and  Hypertension   Office blood pressures are  BP Readings from Last 3 Encounters:  11/03/19 140/90   Kidney Function Lab Results  Component Value Date/Time   CREATININE 0.67 11/03/2019 11:52 AM   GFRNONAA 89 11/03/2019 11:52 AM   GFRAA 103 11/03/2019 11:52 AM   K 3.6 11/03/2019 11:52 AM    Patient has failed these meds in the past: none recalled Patient is currently controlled on the  following medications:   hydralazine 100 mg twice daily with food  Irbesartan 300 mg daily   Propanolol er 120 mg daily  Patient checks BP at home when feeling symptomatic  Patient home BP readings are ranging: not checking often  We discussed diet and exercise extensively. Patient reports that her bp is improved. Patient has lost a lot of weight lately. She consistently keeps losing weight. She has nausea and inability to tolerate food. She tried Boost or Ensure which made her sick. Breakfast Essentials protein drink daily 6 days each week. Based on home scale she is around 86 lbs now.   Patient likes vegetables and fish. Does not eat a lot of eggs but eats every now and then. Likes peanut butter. Favorite food is vegetables. Somedays she is nauseated and unable to eat. Today she was unable to finish a tomato sandwich because felt full or sick and had to stop.   Has had a lot of issues with feeling full of air and needs to belch. She does not have a gall bladder or appendix. Her daughter would like for her to see a gastroenterologist. She wants to see Dr. Tobie Poet first to check on her spot on the lung to make sure it isn't related to weight loss.   Plan  Continue current medications   Hyperlipidemia   LDL goal < 100  Lipid Panel     Component Value Date/Time   CHOL 148 11/03/2019 1152   TRIG 100 11/03/2019 1152   HDL 50 11/03/2019 1152   LDLCALC 80 11/03/2019 1152    Hepatic Function Latest Ref Rng & Units 11/03/2019  Total Protein 6.0 - 8.5 g/dL 6.6  Albumin 3.8 - 4.8 g/dL 3.9  AST 0 - 40 IU/L 17  ALT 0 - 32 IU/L 7  Alk Phosphatase 48 - 121 IU/L 72  Total Bilirubin 0.0 - 1.2 mg/dL 1.2     The 10-year ASCVD risk score Mikey Bussing DC Jr., et al., 2013) is: 21.9%   Values used to calculate the score:     Age: 34 years     Sex: Female     Is Non-Hispanic African American: No     Diabetic: No  Tobacco smoker: Yes     Systolic Blood Pressure: 923 mmHg     Is BP treated: Yes      HDL Cholesterol: 50 mg/dL     Total Cholesterol: 148 mg/dL   Patient has failed these meds in past: n/a Patient is currently controlled on the following medications:  . Aspirin 81 mg daily . Pravastatin 10 mg daily  We discussed:  diet and exercise extensively. Patient is a candidate for a higher intensity statin but would like to defer consideration at this time due to weight loss and stomach issues lately.  Plan  Continue current medications  Depression   Patient has failed these meds in past: mirtazapine  Patient is currently controlled on the following medications:  . fluoxetine 10 mg daily . Trazodone 50 mg at bedtime as needed  We discussed:  Feels that fluoxetine 10 mg daily is helping with stress/concern. Hasn't taken trazodone lately for sleep. Cuts in half if she needs something for sleep. She reports that she can sleep well and a lot. She is tired and sleepy at times but her mind is racing with thoughts. She worries most about breathing and weight loss. She tries not to make it a habit to take trazodone.   Plan  Continue current medications  Osteopenia / Osteoporosis   Last DEXA Scan:  not listed in chart  No results found for: VD25OH   Patient has failed these meds in past: n/a Patient is currently uncontrolled on the following medications:  . n/a  We discussed:  Recommend [phone] units of vitamin D daily. Recommend 1200 mg of calcium daily from dietary and supplemental sources. Recommend weight-bearing and muscle strengthening exercises for building and maintaining bone density.  Plan  Begin Calcium and Vitamin D supplementation. Recommend updated Dexa Scan and Vitamin D level.   Tobacco Abuse   Tobacco Status:  Social History   Tobacco Use  Smoking Status Current Every Day Smoker  . Packs/day: 0.50  . Types: Cigarettes  Smokeless Tobacco Never Used    Patient has failed these meds in past: bupropion Patient is currently uncontrolled on the  following medications:  . n/a  We discussed:  Counseled on patch placement, side effects, and option to remove at night if they experience trouble sleeping or bad dreams.  Provided contact information for [place] [facility] (1-800-QUIT-NOW) and encouraged patient to reach out to this group for support. Pharmacist checked eligibility for nicotine patches through insurance. They are not covered. Patient will reach out to the Quit Now Helpline for assistance. Patient is not interested in Chantix.   Plan  Recommend patient reach out to the [place] [facility].    Health Maintenance   Patient is currently controlled on the following medications:  . Biotin 1000 mcg daily supplement for hair, skin and nails . Ibuprofen 200 mg 4 tablets prn back pain - takes maybe once a week  We discussed:  Discussed eating when taking ibuprofen to avoid irritation of stomach.   Plan  Continue current medications  Vaccines   Reviewed and discussed patient's vaccination history. Patient has not had a COVID vaccine and doesn't want to take it right now. Patient has not had shingles vaccine due to cost. Patient cannot remember last tetanus shot.   Immunization History  Administered Date(s) Administered  . Influenza-Unspecified 01/28/2019  . Pneumococcal Conjugate-13 02/01/2016  . Pneumococcal Polysaccharide-23 03/02/2014    Plan  Recommended patient receive annual flu vaccine in office.   Medication Management  Pt uses [facility] pharmacy for all medications Uses pill box? Yes Pt endorses excellent compliance  We discussed: Discussed cost and affordability of medications. Pharmacist working to assist patient with affordability on smoking cessation patches if possible.   Plan  Continue current medication management strategy    Follow up: 3 month phone visit

## 2019-12-20 ENCOUNTER — Ambulatory Visit: Payer: Medicare Other

## 2019-12-20 DIAGNOSIS — E782 Mixed hyperlipidemia: Secondary | ICD-10-CM

## 2019-12-20 DIAGNOSIS — J449 Chronic obstructive pulmonary disease, unspecified: Secondary | ICD-10-CM

## 2019-12-20 DIAGNOSIS — I10 Essential (primary) hypertension: Secondary | ICD-10-CM

## 2019-12-21 ENCOUNTER — Other Ambulatory Visit: Payer: Self-pay

## 2019-12-21 MED ORDER — ALBUTEROL SULFATE HFA 108 (90 BASE) MCG/ACT IN AERS
2.0000 | INHALATION_SPRAY | Freq: Four times a day (QID) | RESPIRATORY_TRACT | 2 refills | Status: DC | PRN
Start: 2019-12-21 — End: 2019-12-30

## 2019-12-21 NOTE — Telephone Encounter (Signed)
Per Emeterio Reeve "This is one of Dr. Alyse Low patients that has COPD but no rescue inhaler due to cost. I found their formulary and at [place] the Albuterol 8.5 gram size would be free. Would it be possible to build the prescription for Gay Filler or Dr. Henrene Pastor to submit to [facility]?"

## 2019-12-21 NOTE — Patient Instructions (Addendum)
Visit Information  Thank you for your time discussing your medications. I look forward to working with you to achieve your health care goals. Below is a summary of what we talked about during our visit.   Goals Addressed            This Visit's Progress   . Pharmacy Care Plan       CARE PLAN ENTRY (see longitudinal plan of care for additional care plan information)  Current Barriers:  . Chronic Disease Management support, education, and care coordination needs related to Hypertension, Hyperlipidemia, and COPD   Hypertension BP Readings from Last 3 Encounters:  11/03/19 140/90   . Pharmacist Clinical Goal(s): o Over the next 90 days, patient will work with PharmD and providers to maintain BP goal <140/90 . Current regimen:  o Hydralazine 100 mg twice daily with food o Irbesartan 300 mg daily o Propanolol ER 120 mg daily . Interventions: o Discussed diet and ways to incorporate nutrients while limited appetite.  . Patient self care activities - Over the next 90 days, patient will: o Check BP if symptomatic, document, and provide at future appointments o Ensure daily salt intake < 2300 mg/day  Hyperlipidemia Lab Results  Component Value Date/Time   LDLCALC 80 11/03/2019 11:52 AM   . Pharmacist Clinical Goal(s): o Over the next 90 days, patient will work with PharmD and providers to maintain LDL goal < 70 . Current regimen:  o Pravastatin 10 mg daily o Aspirin 81 mg daily . Interventions: o Due to patient's high ASCVD risk (21.9%), she would benefit from a high intensity statin.  . Patient self care activities - Over the next 90 days, patient will: o Continue to eat take medication as prescribed.   COPD  . Pharmacist Clinical Goal(s) o Over the next 90 days, patient will work with PharmD and providers to manage COPD.  . Current regimen:  o Breztril 2 puffs twice daily o Albuterol inhaler prn  . Interventions: o Pharmacist will work to improve access to [employer]  through [facility].  . Patient self care activities - Over the next 90 days, patient will: o Work to use albuterol prn for shortness of breath and wheezing once arrived from mail-order.   Medication management . Pharmacist Clinical Goal(s): o Over the next 90 days, patient will work with PharmD and providers to maintain optimal medication adherence . Current pharmacy: [facility] . Interventions o Comprehensive medication review performed. o Continue current medication management strategy . Patient self care activities - Over the next 90 days, patient will: o Focus on medication adherence by continuing to use pill box o Take medications as prescribed o Report any questions or concerns to PharmD and/or provider(s)  Initial goal documentation        Ms. Bosak was given information about Chronic Care Management services today including:  1. CCM service includes personalized support from designated clinical staff supervised by her physician, including individualized plan of care and coordination with other care providers 2. 24/7 contact phone numbers for assistance for urgent and routine care needs. 3. Standard insurance, coinsurance, copays and deductibles apply for chronic care management only during months in which we provide at least 20 minutes of these services. Most insurances cover these services at 100%, however patients may be responsible for any copay, coinsurance and/or deductible if applicable. This service may help you avoid the need for more expensive face-to-face services. 4. Only one practitioner may furnish and bill the service in a  calendar month. 5. The patient may stop CCM services at any time (effective at the end of the month) by phone call to the office staff.  Patient agreed to services and verbal consent obtained.   The patient verbalized understanding of instructions provided today and agreed to receive a mailed copy of patient instruction and/or  educational materials. Telephone follow up appointment with pharmacy team member scheduled for: 03/2020  Sherre Poot, PharmD Clinical Pharmacist [facility] [phone] (office) [phone] (mobile)  Coping with Quitting Smoking  Quitting smoking is a physical and mental challenge. You will face cravings, withdrawal symptoms, and temptation. Before quitting, work with your health care provider to make a plan that can help you cope. Preparation can help you quit and keep you from giving in. How can I cope with cravings? Cravings usually last for 5-10 minutes. If you get through it, the craving will pass. Consider taking the following actions to help you cope with cravings:  Keep your mouth busy: ? Chew sugar-free gum. ? Suck on hard candies or a straw. ? Brush your teeth.  Keep your hands and body busy: ? Immediately change to a different activity when you feel a craving. ? Squeeze or play with a ball. ? Do an activity or a hobby, like making bead jewelry, practicing needlepoint, or working with wood. ? Mix up your normal routine. ? Take a short exercise break. Go for a quick walk or run up and down stairs. ? Spend time in public places where smoking is not allowed.  Focus on doing something kind or helpful for someone else.  Call a friend or family member to talk during a craving.  Join a support group.  Call a quit line, such as 1-800-QUIT-NOW.  Talk with your health care provider about medicines that might help you cope with cravings and make quitting easier for you. How can I deal with withdrawal symptoms? Your body may experience negative effects as it tries to get used to not having nicotine in the system. These effects are called withdrawal symptoms. They may include:  Feeling hungrier than normal.  Trouble concentrating.  Irritability.  Trouble sleeping.  Feeling depressed.  Restlessness and agitation.  Craving a cigarette. To manage  withdrawal symptoms:  Avoid places, people, and activities that trigger your cravings.  Remember why you want to quit.  Get plenty of sleep.  Avoid coffee and other caffeinated drinks. These may worsen some of your symptoms. How can I handle social situations? Social situations can be difficult when you are quitting smoking, especially in the first few weeks. To manage this, you can:  Avoid parties, bars, and other social situations where people might be smoking.  Avoid alcohol.  Leave right away if you have the urge to smoke.  Explain to your family and friends that you are quitting smoking. Ask for understanding and support.  Plan activities with friends or family where smoking is not an option. What are some ways I can cope with stress? Wanting to smoke may cause stress, and stress can make you want to smoke. Find ways to manage your stress. Relaxation techniques can help. For example:  Breathe slowly and deeply, in through your nose and out through your mouth.  Listen to soothing, relaxing music.  Talk with a family member or friend about your stress.  Light a candle.  Soak in a bath or take a shower.  Think about a peaceful place. What are some ways I can prevent weight gain?  Be aware that many people gain weight after they quit smoking. However, not everyone does. To keep from gaining weight, have a plan in place before you quit and stick to the plan after you quit. Your plan should include:  Having healthy snacks. When you have a craving, it may help to: ? Eat plain popcorn, crunchy carrots, celery, or other cut vegetables. ? Chew sugar-free gum.  Changing how you eat: ? Eat small portion sizes at meals. ? Eat 4-6 small meals throughout the day instead of 1-2 large meals a day. ? Be mindful when you eat. Do not watch television or do other things that might distract you as you eat.  Exercising regularly: ? Make time to exercise each day. If you do not have time  for a long workout, do short bouts of exercise for 5-10 minutes several times a day. ? Do some form of strengthening exercise, like weight lifting, and some form of aerobic exercise, like running or swimming.  Drinking plenty of water or other low-calorie or no-calorie drinks. Drink 6-8 glasses of water daily, or as much as instructed by your health care provider. Summary  Quitting smoking is a physical and mental challenge. You will face cravings, withdrawal symptoms, and temptation to smoke again. Preparation can help you as you go through these challenges.  You can cope with cravings by keeping your mouth busy (such as by chewing gum), keeping your body and hands busy, and making calls to family, friends, or a helpline for people who want to quit smoking.  You can cope with withdrawal symptoms by avoiding places where people smoke, avoiding drinks with caffeine, and getting plenty of rest.  Ask your health care provider about the different ways to prevent weight gain, avoid stress, and handle social situations. This information is not intended to replace advice given to you by your health care provider. Make sure you discuss any questions you have with your health care provider. Document Revised: 05/01/2017 Document Reviewed: 05/16/2016 Elsevier Patient Education  2020 [employer].

## 2019-12-23 ENCOUNTER — Other Ambulatory Visit: Payer: Self-pay | Admitting: Legal Medicine

## 2019-12-23 DIAGNOSIS — M810 Age-related osteoporosis without current pathological fracture: Secondary | ICD-10-CM

## 2019-12-26 ENCOUNTER — Telehealth: Payer: Self-pay

## 2019-12-26 NOTE — Telephone Encounter (Signed)
[employer] sent documentation stating Ventolin HFA is not covered, ProAir and Proventil are the covered alternatives.

## 2019-12-27 ENCOUNTER — Other Ambulatory Visit: Payer: Self-pay | Admitting: Family Medicine

## 2019-12-28 ENCOUNTER — Other Ambulatory Visit: Payer: Self-pay | Admitting: Family Medicine

## 2019-12-28 MED ORDER — ALBUTEROL SULFATE HFA 108 (90 BASE) MCG/ACT IN AERS: 2.0000 | INHALATION_SPRAY | Freq: Four times a day (QID) | RESPIRATORY_TRACT | 1 refills | Status: DC | PRN

## 2019-12-30 ENCOUNTER — Other Ambulatory Visit: Payer: Self-pay

## 2019-12-30 DIAGNOSIS — I10 Essential (primary) hypertension: Secondary | ICD-10-CM

## 2019-12-30 DIAGNOSIS — E782 Mixed hyperlipidemia: Secondary | ICD-10-CM

## 2019-12-30 NOTE — Chronic Care Management (AMB) (Signed)
Contacted patient to let her know that [facility] patient assistance has been approved through 06/01/2020. Medication is shipping via [employer] to her home.   Sherre Poot, PharmD, [facility] Clinical Pharmacist [facility] [phone] (office) [phone] (mobile)

## 2019-12-30 NOTE — Progress Notes (Signed)
Patient had an appt with Sherre Poot, PharmD on 12/20/2019.

## 2020-01-04 ENCOUNTER — Other Ambulatory Visit: Payer: Self-pay

## 2020-01-04 ENCOUNTER — Ambulatory Visit (INDEPENDENT_AMBULATORY_CARE_PROVIDER_SITE_OTHER): Payer: Medicare Other | Admitting: Family Medicine

## 2020-01-04 ENCOUNTER — Encounter: Payer: Self-pay | Admitting: Family Medicine

## 2020-01-04 VITALS — BP 110/68 | HR 65 | Temp 97.6°F | Ht 60.0 in | Wt 90.4 lb

## 2020-01-04 DIAGNOSIS — R634 Abnormal weight loss: Secondary | ICD-10-CM

## 2020-01-04 DIAGNOSIS — E441 Mild protein-calorie malnutrition: Secondary | ICD-10-CM | POA: Diagnosis not present

## 2020-01-04 DIAGNOSIS — R911 Solitary pulmonary nodule: Secondary | ICD-10-CM

## 2020-01-04 DIAGNOSIS — J432 Centrilobular emphysema: Secondary | ICD-10-CM | POA: Diagnosis not present

## 2020-01-04 HISTORY — DX: Abnormal weight loss: R63.4

## 2020-01-04 HISTORY — DX: Solitary pulmonary nodule: R91.1

## 2020-01-04 NOTE — Patient Instructions (Addendum)
Recommend covid vaccines. Recommend return for flu vaccine.

## 2020-01-04 NOTE — Progress Notes (Signed)
Subjective:  Patient ID: Misty Evans, female    DOB: 06-16-1950  Age: 69 y.o. MRN: [id number]  Chief Complaint  Patient presents with  . unintentional weight loss    HPI  Patient states she is still losing weight unintentionally. She eats at least 3 meals a day, she drinks 2 carnation drinks in the morning with her breakfast, typically eats a sandwich and fruit for lunch and for supper will a meat and vegetables.  Previous work-up included a CT scan of the lungs, CT of the abdomen and pelvis, mammogram, lab work, and a referral to renal cancer center.  This was all done in 2020.  Although she has not lost more weight in the last 6 months she is still under weight or malnourished. Has had a repeat mammogram this year which was normal.  Current Outpatient Medications on File Prior to Visit  Medication Sig Dispense Refill  . albuterol (ACCUNEB) 1.25 MG/3ML nebulizer solution Take 1 ampule by nebulization every 6 (six) hours as needed for wheezing.    Marland Kitchen albuterol (PROAIR HFA) 108 (90 Base) MCG/ACT inhaler Inhale 2 puffs into the lungs every 6 (six) hours as needed for wheezing or shortness of breath. 44 g 1  . aspirin 81 MG chewable tablet Chew 81 mg by mouth daily.    . Biotin 10000 MCG TABS Take by mouth daily.     Marland Kitchen FLUoxetine (PROZAC) 10 MG tablet Take 1 tablet (10 mg total) by mouth daily. 30 tablet 3  . hydrALAZINE (APRESOLINE) 100 MG tablet TAKE 1 TABLET BY MOUTH  TWICE DAILY WITH FOOD 180 tablet 1  . ibuprofen (ADVIL) 200 MG tablet Take 800 mg by mouth every 6 (six) hours as needed. Takes 4 tablets as needed for back pain    . irbesartan (AVAPRO) 300 MG tablet TAKE 1 TABLET BY MOUTH ONCE DAILY 90 tablet 1  . pravastatin (PRAVACHOL) 10 MG tablet TAKE 1 TABLET BY MOUTH ONCE DAILY 90 tablet 3  . propranolol ER (INDERAL LA) 120 MG 24 hr capsule TAKE 1 CAPSULE BY MOUTH  DAILY 90 capsule 1  . traZODone (DESYREL) 50 MG tablet Take 1 tablet (50 mg total) by mouth at bedtime as  needed. 90 tablet 1   No current facility-administered medications on file prior to visit.   Past Medical History:  Diagnosis Date  . Depression   . Insomnia   . Osteoporosis   . Renal stones 05/2015   Past Surgical History:  Procedure Laterality Date  . ABDOMINAL HYSTERECTOMY    . APPENDECTOMY    . BACK SURGERY    . CHOLECYSTECTOMY      Family History  Problem Relation Age of Onset  . Hypertension Mother   . Cancer Father        esophageal  . Hypertension Father   . Hypertension Maternal Grandmother   . Heart attack Maternal Grandmother   . Diabetes Paternal Grandmother   . Hypertension Paternal Grandmother    Social History   Socioeconomic History  . Marital status: Married    Spouse name: Not on file  . Number of children: 2  . Years of education: Not on file  . Highest education level: Not on file  Occupational History  . Not on file  Tobacco Use  . Smoking status: Current Every Day Smoker    Packs/day: 0.50    Types: Cigarettes  . Smokeless tobacco: Never Used  Substance and Sexual Activity  . Alcohol use: Not Currently  .  Drug use: Never  . Sexual activity: Not on file  Other Topics Concern  . Not on file  Social History Narrative  . Not on file   Social Determinants of Health   Financial Resource Strain:   . Difficulty of Paying Living Expenses:   Food Insecurity:   . Worried About Charity fundraiser in the Last Year:   . Arboriculturist in the Last Year:   Transportation Needs:   . Film/video editor (Medical):   Marland Kitchen Lack of Transportation (Non-Medical):   Physical Activity:   . Days of Exercise per Week:   . Minutes of Exercise per Session:   Stress:   . Feeling of Stress :   Social Connections:   . Frequency of Communication with Friends and Family:   . Frequency of Social Gatherings with Friends and Family:   . Attends Religious Services:   . Active Member of Clubs or Organizations:   . Attends Archivist Meetings:   Marland Kitchen  Marital Status:     Review of Systems  Constitutional: Positive for chills. Negative for fatigue and fever.  HENT: Positive for rhinorrhea. Negative for congestion, ear pain and sore throat.   Respiratory: Negative for cough and shortness of breath.   Cardiovascular: Negative for chest pain.  Gastrointestinal: Positive for nausea (sometimes). Negative for abdominal pain, constipation, diarrhea and vomiting.  Genitourinary: Negative for dysuria, frequency and hematuria.  Musculoskeletal: Positive for back pain (3 lumbar surgeries and hardware protrudes. ).  Allergic/Immunologic: Positive for environmental allergies.  Neurological: Negative for dizziness and headaches.  Psychiatric/Behavioral: Negative for dysphoric mood. The patient is nervous/anxious.      Objective:  BP 110/68   Pulse 65   Temp 97.6 F (36.4 C)   Ht 5' (1.524 m)   Wt 90 lb 6.4 oz (41 kg)   SpO2 90%   BMI 17.66 kg/m   BP/Weight 11/04/3327 10/01/8839  Systolic BP 660 630  Diastolic BP 68 90  Wt. (Lbs) 90.4 91  BMI 17.66 17.77    Physical Exam Vitals reviewed.  Constitutional:      Appearance: Normal appearance. She is normal weight.  HENT:     Right Ear: Tympanic membrane normal.     Left Ear: Tympanic membrane normal.     Nose: Nose normal. No congestion.     Mouth/Throat:     Mouth: Mucous membranes are moist.     Pharynx: No oropharyngeal exudate.  Cardiovascular:     Rate and Rhythm: Normal rate and regular rhythm.     Pulses: Normal pulses.     Heart sounds: Normal heart sounds.  Pulmonary:     Effort: Pulmonary effort is normal. No respiratory distress.     Breath sounds: Normal breath sounds.  Abdominal:     General: Abdomen is flat. Bowel sounds are normal.     Palpations: Abdomen is soft.     Tenderness: There is no abdominal tenderness.  Lymphadenopathy:     Cervical: No cervical adenopathy.  Skin:    General: Skin is warm and dry.  Neurological:     Mental Status: She is alert and  oriented to person, place, and time.  Psychiatric:        Mood and Affect: Mood normal.        Behavior: Behavior normal.     Diabetic Foot Exam - Simple   No data filed       Lab Results  Component Value Date   WBC  8.8 11/03/2019   HGB 16.3 (H) 11/03/2019   HCT 50.1 (H) 11/03/2019   PLT 262 11/03/2019   GLUCOSE 74 11/03/2019   CHOL 148 11/03/2019   TRIG 100 11/03/2019   HDL 50 11/03/2019   LDLCALC 80 11/03/2019   ALT 7 11/03/2019   AST 17 11/03/2019   NA 144 11/03/2019   K 3.6 11/03/2019   CL 101 11/03/2019   CREATININE 0.67 11/03/2019   BUN 16 11/03/2019   CO2 29 11/03/2019      Assessment & Plan:   1. Abnormal weight loss - CBC with Differential/Platelet - Comprehensive metabolic panel - TSH - VITAMIN D 25 Hydroxy (Vit-D Deficiency, Fractures); Future - CT Chest Wo Contrast; Future  2. Nodule of left lung - CT Chest Wo Contrast; Future  3. Centrilobular emphysema (HCC) - CT Chest Wo Contrast; Future  4. Mild protein-calorie malnutrition ([place]) Continue to drink high protein drinks.     Orders Placed This Encounter  Procedures  . CT Chest Wo Contrast  . CBC with Differential/Platelet  . Comprehensive metabolic panel  . TSH  . VITAMIN D 25 Hydroxy (Vit-D Deficiency, Fractures)     Follow-up: No follow-ups on file.  An After Visit Summary was printed and given to the patient.  Rochel Brome [facility] [phone]

## 2020-01-05 LAB — CBC WITH DIFFERENTIAL/PLATELET
Basophils Absolute: 0.1 10*3/uL (ref 0.0–0.2)
Basos: 1 %
EOS (ABSOLUTE): 0.1 10*3/uL (ref 0.0–0.4)
Eos: 1 %
Hematocrit: 46 % (ref 34.0–46.6)
Hemoglobin: 15.2 g/dL (ref 11.1–15.9)
Immature Grans (Abs): 0.1 10*3/uL (ref 0.0–0.1)
Immature Granulocytes: 1 %
Lymphocytes Absolute: 1.7 10*3/uL (ref 0.7–3.1)
Lymphs: 17 %
MCH: 30.2 pg (ref 26.6–33.0)
MCHC: 33 g/dL (ref 31.5–35.7)
MCV: 92 fL (ref 79–97)
Monocytes Absolute: 0.8 10*3/uL (ref 0.1–0.9)
Monocytes: 8 %
Neutrophils Absolute: 7.4 10*3/uL — ABNORMAL HIGH (ref 1.4–7.0)
Neutrophils: 72 %
Platelets: 273 10*3/uL (ref 150–450)
RBC: 5.03 x10E6/uL (ref 3.77–5.28)
RDW: 13.3 % (ref 11.7–15.4)
WBC: 10.2 10*3/uL (ref 3.4–10.8)

## 2020-01-05 LAB — COMPREHENSIVE METABOLIC PANEL
ALT: 19 IU/L (ref 0–32)
AST: 27 IU/L (ref 0–40)
Albumin/Globulin Ratio: 1.6 (ref 1.2–2.2)
Albumin: 4.1 g/dL (ref 3.8–4.8)
Alkaline Phosphatase: 72 IU/L (ref 48–121)
BUN/Creatinine Ratio: 29 — ABNORMAL HIGH (ref 12–28)
BUN: 24 mg/dL (ref 8–27)
Bilirubin Total: 0.6 mg/dL (ref 0.0–1.2)
CO2: 26 mmol/L (ref 20–29)
Calcium: 9.6 mg/dL (ref 8.7–10.3)
Chloride: 100 mmol/L (ref 96–106)
Creatinine, Ser: 0.82 mg/dL (ref 0.57–1.00)
GFR calc Af Amer: 84 mL/min/{1.73_m2} (ref >59)
GFR calc non Af Amer: 73 mL/min/{1.73_m2} (ref >59)
Globulin, Total: 2.6 g/dL (ref 1.5–4.5)
Glucose: 84 mg/dL (ref 65–99)
Potassium: 4.3 mmol/L (ref 3.5–5.2)
Sodium: 139 mmol/L (ref 134–144)
Total Protein: 6.7 g/dL (ref 6.0–8.5)

## 2020-01-05 LAB — TSH: TSH: 2.37 u[IU]/mL (ref 0.450–4.500)

## 2020-01-07 ENCOUNTER — Other Ambulatory Visit: Payer: Self-pay | Admitting: Family Medicine

## 2020-01-18 ENCOUNTER — Encounter: Payer: Self-pay | Admitting: Family Medicine

## 2020-01-18 DIAGNOSIS — I2510 Atherosclerotic heart disease of native coronary artery without angina pectoris: Secondary | ICD-10-CM | POA: Diagnosis not present

## 2020-01-18 DIAGNOSIS — R911 Solitary pulmonary nodule: Secondary | ICD-10-CM | POA: Diagnosis not present

## 2020-01-18 DIAGNOSIS — J432 Centrilobular emphysema: Secondary | ICD-10-CM | POA: Diagnosis not present

## 2020-01-18 DIAGNOSIS — I708 Atherosclerosis of other arteries: Secondary | ICD-10-CM | POA: Diagnosis not present

## 2020-01-18 DIAGNOSIS — I700 Atherosclerosis of aorta: Secondary | ICD-10-CM | POA: Diagnosis not present

## 2020-02-09 ENCOUNTER — Encounter: Payer: Self-pay | Admitting: Family Medicine

## 2020-02-09 ENCOUNTER — Ambulatory Visit (INDEPENDENT_AMBULATORY_CARE_PROVIDER_SITE_OTHER): Payer: Medicare Other | Admitting: Family Medicine

## 2020-02-09 ENCOUNTER — Other Ambulatory Visit: Payer: Self-pay

## 2020-02-09 VITALS — BP 104/64 | HR 72 | Temp 97.3°F | Resp 14 | Ht 60.0 in | Wt 90.8 lb

## 2020-02-09 DIAGNOSIS — Z8701 Personal history of pneumonia (recurrent): Secondary | ICD-10-CM | POA: Diagnosis not present

## 2020-02-09 DIAGNOSIS — Z23 Encounter for immunization: Secondary | ICD-10-CM | POA: Diagnosis not present

## 2020-02-09 DIAGNOSIS — J449 Chronic obstructive pulmonary disease, unspecified: Secondary | ICD-10-CM | POA: Diagnosis not present

## 2020-02-09 DIAGNOSIS — Z9189 Other specified personal risk factors, not elsewhere classified: Secondary | ICD-10-CM | POA: Diagnosis not present

## 2020-02-09 DIAGNOSIS — F17218 Nicotine dependence, cigarettes, with other nicotine-induced disorders: Secondary | ICD-10-CM

## 2020-02-09 DIAGNOSIS — E782 Mixed hyperlipidemia: Secondary | ICD-10-CM | POA: Diagnosis not present

## 2020-02-09 DIAGNOSIS — F330 Major depressive disorder, recurrent, mild: Secondary | ICD-10-CM

## 2020-02-09 DIAGNOSIS — I10 Essential (primary) hypertension: Secondary | ICD-10-CM

## 2020-02-09 DIAGNOSIS — E441 Mild protein-calorie malnutrition: Secondary | ICD-10-CM | POA: Diagnosis not present

## 2020-02-09 NOTE — Progress Notes (Signed)
Subjective:  Patient ID: Misty Evans, female    DOB: [birth year]  Age: 69 y.o. MRN: [id number]  Chief Complaint  Patient presents with  . Hyperlipidemia  . Hypertension  . COPD    HPI  Pt presents with hyperlipidemia.  Current treatment includes pravastatin 10 mg once daily and a low cholesterol/low fat diet.  Compliance with treatment has been good; she takes her medication as directed, maintains her low cholesterol diet, and follows up as directed.  Not exercising. She denies experiencing any hypercholesterolemia related symptoms.      Pt presents for follow up of hypertension.  Date of diagnosis early 46s.  Her current cardiac medication regimen includes Irbesartan, propranolol, and Hydralazine.  She is tolerating the medication well without side effects.  Compliance with treatment has been good; she takes her medication as directed and follows up as directed.      In regard to the chronic obstructive pulmonary disease, unspecified, the duration of COPD has been one year.  Patient has an established diagnosis of COPD.  There are no associated symptoms of dyspnea or wheezing.  Misty Evans currently smokes 1/2 pack per day.  Pt was able to get pt assistance for breztri and is doing very well on this.    Past Medical History:  Diagnosis Date  . Depression   . Insomnia   . Osteoporosis   . Renal stones 05/2015    Family History  Problem Relation Age of Onset  . Hypertension Mother   . Cancer Father        esophageal  . Hypertension Father   . Hypertension Maternal Grandmother   . Heart attack Maternal Grandmother   . Diabetes Paternal Grandmother   . Hypertension Paternal Grandmother    Social History   Socioeconomic History  . Marital status: Married    Spouse name: Not on file  . Number of children: 2  . Years of education: Not on file  . Highest education level: Not on file  Occupational History  . Not on file  Tobacco Use  . Smoking status: Current Every  Day Smoker    Packs/day: 0.50    Types: Cigarettes  . Smokeless tobacco: Never Used  Substance and Sexual Activity  . Alcohol use: Not Currently  . Drug use: Never  . Sexual activity: Not on file  Other Topics Concern  . Not on file  Social History Narrative  . Not on file   Social Determinants of Health   Financial Resource Strain:   . Difficulty of Paying Living Expenses: Not on file  Food Insecurity:   . Worried About Charity fundraiser in the Last Year: Not on file  . Ran Out of Food in the Last Year: Not on file  Transportation Needs:   . Lack of Transportation (Medical): Not on file  . Lack of Transportation (Non-Medical): Not on file  Physical Activity:   . Days of Exercise per Week: Not on file  . Minutes of Exercise per Session: Not on file  Stress:   . Feeling of Stress : Not on file  Social Connections:   . Frequency of Communication with Friends and Family: Not on file  . Frequency of Social Gatherings with Friends and Family: Not on file  . Attends Religious Services: Not on file  . Active Member of Clubs or Organizations: Not on file  . Attends Archivist Meetings: Not on file  . Marital Status: Not on file    Review  of Systems  Constitutional: Positive for unexpected weight change. Negative for chills, fatigue and fever.  HENT: Negative for congestion, ear pain, rhinorrhea and sore throat.   Respiratory: Positive for shortness of breath (COPD) and wheezing (occ.). Negative for cough.   Cardiovascular: Negative for chest pain and palpitations.  Gastrointestinal: Negative for abdominal pain, constipation, diarrhea, nausea and vomiting.  Genitourinary: Negative for dysuria and urgency.  Musculoskeletal: Negative for back pain and myalgias.  Allergic/Immunologic: Positive for environmental allergies.  Neurological: Negative for dizziness, weakness, light-headedness and headaches.  Psychiatric/Behavioral: Positive for dysphoric mood (Son passed away  3 weeks ago (over dose)). Negative for sleep disturbance (Increase sleep. Decreased appetite) and suicidal ideas. The patient is not nervous/anxious.     Objective:  BP 104/64   Pulse 72   Temp (!) 97.3 F (36.3 C)   Resp 14   Ht 5' (1.524 m)   Wt 90 lb 12.8 oz (41.2 kg)   BMI 17.73 kg/m   BP/Weight 02/09/2020 06/09/8414 6/0/6301  Systolic BP 601 093 235  Diastolic BP 64 68 90  Wt. (Lbs) 90.8 90.4 91  BMI 17.73 17.66 17.77    Physical Exam Vitals reviewed.  Constitutional:      Appearance: Normal appearance. She is normal weight.  Cardiovascular:     Rate and Rhythm: Normal rate and regular rhythm.     Pulses: Normal pulses.     Heart sounds: Normal heart sounds.  Pulmonary:     Effort: Pulmonary effort is normal. No respiratory distress.     Breath sounds: Normal breath sounds.  Abdominal:     General: Abdomen is flat. Bowel sounds are normal.     Palpations: Abdomen is soft.     Tenderness: There is no abdominal tenderness.  Neurological:     Mental Status: She is alert and oriented to person, place, and time.  Psychiatric:        Mood and Affect: Mood normal.        Behavior: Behavior normal.    Diabetic Foot Exam - Simple   No data filed       Lab Results  Component Value Date   WBC 10.6 02/09/2020   HGB 15.8 02/09/2020   HCT 48.4 (H) 02/09/2020   PLT 267 02/09/2020   GLUCOSE 84 02/09/2020   CHOL 169 02/09/2020   TRIG 112 02/09/2020   HDL 57 02/09/2020   LDLCALC 92 02/09/2020   ALT 11 02/09/2020   AST 22 02/09/2020   NA 140 02/09/2020   K 4.1 02/09/2020   CL 101 02/09/2020   CREATININE 0.82 02/09/2020   BUN 19 02/09/2020   CO2 26 02/09/2020   TSH 2.370 01/04/2020      Assessment & Plan:  1. Essential hypertension Well controlled.  No changes to medicines.  Continue to work on eating a healthy diet and exercise.  Labs drawn today.  - CBC with Differential/Platelet - Comprehensive metabolic panel  2. Mixed hyperlipidemia Well controlled.   No changes to medicines.  Continue to work on eating a healthy diet and exercise.  Labs drawn today.  - Lipid panel  3. Chronic obstructive bronchitis (HCC) The current medical regimen is effective;  continue present plan and medications. Strongly recommend smoking cessation.   4. Mild protein-calorie malnutrition ([place]) Continue protein shake. Weigh daily. Stable.  5. Depression, major, recurrent, mild (HCC) Remain on prozac 10 mg once daily and trazodone 50 mg one at night. She does not wish to change her doses at this time.  6. Flu vaccine need - Flu Vaccine QUAD High Dose(Fluad)  7. History of pneumonia as indication for 23-polyvalent pneumococcal polysaccharide vaccine - Pneumococcal polysaccharide vaccine 23-valent greater than or equal to 2yo subcutaneous/IM  8. Cigarette nicotine dependence with other nicotine-induced disorder RECOMMEND GRADUAL WEAN.  I strongly recommend the covid 19 vaccinations (pfizer or moderna.)  Please also continue to practice social distancing, mask wearing, and hand washing. Please call if you develop symptoms of COVID (fever, cough, shortness of breath, congestion, aches, loss of taset/smell, headaches) regardless of vaccination status..  Follow-up: Return in about 3 months (around 05/10/2020) for fasting. Please schedule AWV in 04/2020 with Shelle Iron, LPN.Marland Kitchen  An After Visit Summary was printed and given to the patient.  Rochel Brome [facility] [phone]

## 2020-02-09 NOTE — Patient Instructions (Addendum)
Recommend quit smoking using the [place] method of decreasing cigarette weekly. Please reconsider covid 19 vaccintation!

## 2020-02-10 LAB — CBC WITH DIFFERENTIAL/PLATELET
Basophils Absolute: 0.1 10*3/uL (ref 0.0–0.2)
Basos: 1 %
EOS (ABSOLUTE): 0.2 10*3/uL (ref 0.0–0.4)
Eos: 2 %
Hematocrit: 48.4 % — ABNORMAL HIGH (ref 34.0–46.6)
Hemoglobin: 15.8 g/dL (ref 11.1–15.9)
Immature Grans (Abs): 0.1 10*3/uL (ref 0.0–0.1)
Immature Granulocytes: 1 %
Lymphocytes Absolute: 2 10*3/uL (ref 0.7–3.1)
Lymphs: 18 %
MCH: 29.6 pg (ref 26.6–33.0)
MCHC: 32.6 g/dL (ref 31.5–35.7)
MCV: 91 fL (ref 79–97)
Monocytes Absolute: 0.7 10*3/uL (ref 0.1–0.9)
Monocytes: 7 %
Neutrophils Absolute: 7.6 10*3/uL — ABNORMAL HIGH (ref 1.4–7.0)
Neutrophils: 71 %
Platelets: 267 10*3/uL (ref 150–450)
RBC: 5.33 x10E6/uL — ABNORMAL HIGH (ref 3.77–5.28)
RDW: 13.5 % (ref 11.7–15.4)
WBC: 10.6 10*3/uL (ref 3.4–10.8)

## 2020-02-10 LAB — COMPREHENSIVE METABOLIC PANEL
ALT: 11 IU/L (ref 0–32)
AST: 22 IU/L (ref 0–40)
Albumin/Globulin Ratio: 1.7 (ref 1.2–2.2)
Albumin: 4.3 g/dL (ref 3.8–4.8)
Alkaline Phosphatase: 70 IU/L (ref 48–121)
BUN/Creatinine Ratio: 23 (ref 12–28)
BUN: 19 mg/dL (ref 8–27)
Bilirubin Total: 0.8 mg/dL (ref 0.0–1.2)
CO2: 26 mmol/L (ref 20–29)
Calcium: 9.7 mg/dL (ref 8.7–10.3)
Chloride: 101 mmol/L (ref 96–106)
Creatinine, Ser: 0.82 mg/dL (ref 0.57–1.00)
GFR calc Af Amer: 84 mL/min/{1.73_m2} (ref >59)
GFR calc non Af Amer: 73 mL/min/{1.73_m2} (ref >59)
Globulin, Total: 2.6 g/dL (ref 1.5–4.5)
Glucose: 84 mg/dL (ref 65–99)
Potassium: 4.1 mmol/L (ref 3.5–5.2)
Sodium: 140 mmol/L (ref 134–144)
Total Protein: 6.9 g/dL (ref 6.0–8.5)

## 2020-02-10 LAB — LIPID PANEL
Chol/HDL Ratio: 3 ratio (ref 0.0–4.4)
Cholesterol, Total: 169 mg/dL (ref 100–199)
HDL: 57 mg/dL (ref >39)
LDL Chol Calc (NIH): 92 mg/dL (ref 0–99)
Triglycerides: 112 mg/dL (ref 0–149)
VLDL Cholesterol Cal: 20 mg/dL (ref 5–40)

## 2020-02-10 LAB — CARDIOVASCULAR RISK ASSESSMENT

## 2020-02-14 DIAGNOSIS — N281 Cyst of kidney, acquired: Secondary | ICD-10-CM | POA: Diagnosis not present

## 2020-02-14 DIAGNOSIS — Z09 Encounter for follow-up examination after completed treatment for conditions other than malignant neoplasm: Secondary | ICD-10-CM | POA: Diagnosis not present

## 2020-02-14 DIAGNOSIS — R109 Unspecified abdominal pain: Secondary | ICD-10-CM | POA: Diagnosis not present

## 2020-02-14 DIAGNOSIS — N200 Calculus of kidney: Secondary | ICD-10-CM | POA: Diagnosis not present

## 2020-02-14 DIAGNOSIS — Z87442 Personal history of urinary calculi: Secondary | ICD-10-CM | POA: Diagnosis not present

## 2020-02-20 ENCOUNTER — Other Ambulatory Visit: Payer: Self-pay | Admitting: Family Medicine

## 2020-02-24 DIAGNOSIS — N200 Calculus of kidney: Secondary | ICD-10-CM | POA: Diagnosis not present

## 2020-03-06 ENCOUNTER — Other Ambulatory Visit: Payer: Self-pay | Admitting: Family Medicine

## 2020-03-06 DIAGNOSIS — F330 Major depressive disorder, recurrent, mild: Secondary | ICD-10-CM

## 2020-03-22 ENCOUNTER — Other Ambulatory Visit: Payer: Self-pay

## 2020-03-22 ENCOUNTER — Ambulatory Visit: Payer: Medicare Other

## 2020-03-22 DIAGNOSIS — I10 Essential (primary) hypertension: Secondary | ICD-10-CM

## 2020-03-22 DIAGNOSIS — E782 Mixed hyperlipidemia: Secondary | ICD-10-CM

## 2020-03-22 NOTE — Patient Instructions (Signed)
Visit Information  Goals Addressed            This Visit's Progress   . Pharmacy Care Plan       CARE PLAN ENTRY (see longitudinal plan of care for additional care plan information)  Current Barriers:  . Chronic Disease Management support, education, and care coordination needs related to Hypertension, Hyperlipidemia, and COPD   Hypertension BP Readings from Last 3 Encounters:  02/09/20 104/64  01/04/20 110/68  11/03/19 140/90   . Pharmacist Clinical Goal(s): o Over the next 90 days, patient will work with PharmD and providers to maintain BP goal <140/90 . Current regimen:  o Hydralazine 100 mg twice daily with food o Irbesartan 300 mg daily o Propanolol ER 120 mg daily . Interventions: o Discussed diet and ways to incorporate nutrients while limited appetite.  . Patient self care activities - Over the next 90 days, patient will: o Check BP if symptomatic, document, and provide at future appointments o Ensure daily salt intake < 2300 mg/day  Hyperlipidemia Lab Results  Component Value Date/Time   LDLCALC 92 02/09/2020 11:30 AM   . Pharmacist Clinical Goal(s): o Over the next 90 days, patient will work with PharmD and providers to maintain LDL goal < 100 . Current regimen:  o Pravastatin 10 mg daily o Aspirin 81 mg daily . Interventions: o Patient reports good adherence with medication.  o Denies current symptoms.  . Patient self care activities - Over the next 90 days, patient will: o Continue to take medication as prescribed.   COPD  . Pharmacist Clinical Goal(s) o Over the next 90 days, patient will work with PharmD and providers to manage COPD.  . Current regimen:  o Breztril 2 puffs twice daily o Albuterol inhaler prn  . Interventions: o Patient reports improved symptom control with Breztri. Receiving from patient assistance.  o Pharmacist will coordinate enrollment for 2022 year.  . Patient self care activities - Over the next 90 days, patient  will: o Reach out to [employer] for smoking cessation help when ready.  o Continue using Breztri 2 puffs twice daily.   Medication management . Pharmacist Clinical Goal(s): o Over the next 90 days, patient will work with PharmD and providers to maintain optimal medication adherence . Current pharmacy: [facility] . Interventions o Comprehensive medication review performed. o Continue current medication management strategy . Patient self care activities - Over the next 90 days, patient will: o Focus on medication adherence by continuing to use pill box o Take medications as prescribed o Report any questions or concerns to PharmD and/or provider(s)  Please see past updates related to this goal by clicking on the "Past Updates" button in the selected goal         Patient verbalizes understanding of instructions provided today.   Telephone follow up appointment with pharmacy team member scheduled for: 08/2020  Sherre Poot, PharmD, [facility] Clinical Pharmacist [facility] [phone] (office) [phone] (mobile)

## 2020-03-22 NOTE — Chronic Care Management (AMB) (Signed)
Chronic Care Management Pharmacy  Name: Misty Evans  MRN: [id number] DOB: August 03, 1950  Chief Complaint/ HPI  Misty Evans,  69 y.o. , female presents for their Follow-Up CCM visit with the clinical pharmacist via telephone due to COVID-19 Pandemic.  PCP : Misty Brome, MD  Their chronic conditions include: HTN, chronic obstructive bronchitis, HLD, depression, mild protein-calorie malnutrition.  Office Visits:  02/09/2020 - flu dose given. Prevnar 23. Recommend smoking cessation.  01/04/2020 - recommend Covid vaccines and flu vaccine.    Consult Visit: 02/14/2020 - urology - kidney stones.  08/12/2019 - Urology - ultrasound indicates calculus of kidney. Follow-up in 6 months.   Medications: Outpatient Encounter Medications as of 03/22/2020  Medication Sig  . albuterol (ACCUNEB) 1.25 MG/3ML nebulizer solution Take 1 ampule by nebulization every 6 (six) hours as needed for wheezing.  Marland Kitchen albuterol (PROAIR HFA) 108 (90 Base) MCG/ACT inhaler Inhale 2 puffs into the lungs every 6 (six) hours as needed for wheezing or shortness of breath.  Marland Kitchen aspirin 81 MG chewable tablet Chew 81 mg by mouth daily.  . Biotin 10000 MCG TABS Take by mouth daily.   Marland Kitchen FLUoxetine (PROZAC) 10 MG tablet Take 1 tablet by mouth once daily  . hydrALAZINE (APRESOLINE) 100 MG tablet TAKE 1 TABLET BY MOUTH  TWICE DAILY WITH FOOD  . ibuprofen (ADVIL) 200 MG tablet Take 800 mg by mouth every 6 (six) hours as needed. Takes 4 tablets as needed for back pain  . irbesartan (AVAPRO) 300 MG tablet TAKE 1 TABLET BY MOUTH ONCE DAILY  . pravastatin (PRAVACHOL) 10 MG tablet TAKE 1 TABLET BY MOUTH ONCE DAILY  . propranolol ER (INDERAL LA) 120 MG 24 hr capsule TAKE 1 CAPSULE BY MOUTH  DAILY  . traZODone (DESYREL) 50 MG tablet Take 1 tablet (50 mg total) by mouth at bedtime as needed.   No facility-administered encounter medications on file as of 03/22/2020.   Allergies  Allergen Reactions  . Penicillin G  Rash  . Sulfamethoxazole Rash    SDOH Screenings   Alcohol Screen:   . Last Alcohol Screening Score (AUDIT): Not on file  Depression (PHQ2-9): Medium Risk  . PHQ-2 Score: 6  Financial Resource Strain:   . Difficulty of Paying Living Expenses: Not on file  Food Insecurity:   . Worried About Charity fundraiser in the Last Year: Not on file  . Ran Out of Food in the Last Year: Not on file  Housing:   . Last Housing Risk Score: Not on file  Physical Activity:   . Days of Exercise per Week: Not on file  . Minutes of Exercise per Session: Not on file  Social Connections:   . Frequency of Communication with Friends and Family: Not on file  . Frequency of Social Gatherings with Friends and Family: Not on file  . Attends Religious Services: Not on file  . Active Member of Clubs or Organizations: Not on file  . Attends Archivist Meetings: Not on file  . Marital Status: Not on file  Stress:   . Feeling of Stress : Not on file  Tobacco Use: High Risk  . Smoking Tobacco Use: Current Every Day Smoker  . Smokeless Tobacco Use: Never Used  Transportation Needs:   . Film/video editor (Medical): Not on file  . Lack of Transportation (Non-Medical): Not on file    Current Diagnosis/Assessment:  Goals Addressed            This Visit's  Progress   . Pharmacy Care Plan       CARE PLAN ENTRY (see longitudinal plan of care for additional care plan information)  Current Barriers:  . Chronic Disease Management support, education, and care coordination needs related to Hypertension, Hyperlipidemia, and COPD   Hypertension BP Readings from Last 3 Encounters:  02/09/20 104/64  01/04/20 110/68  11/03/19 140/90   . Pharmacist Clinical Goal(s): o Over the next 90 days, patient will work with PharmD and providers to maintain BP goal <140/90 . Current regimen:  o Hydralazine 100 mg twice daily with food o Irbesartan 300 mg daily o Propanolol ER 120 mg  daily . Interventions: o Discussed diet and ways to incorporate nutrients while limited appetite.  . Patient self care activities - Over the next 90 days, patient will: o Check BP if symptomatic, document, and provide at future appointments o Ensure daily salt intake < 2300 mg/day  Hyperlipidemia Lab Results  Component Value Date/Time   LDLCALC 92 02/09/2020 11:30 AM   . Pharmacist Clinical Goal(s): o Over the next 90 days, patient will work with PharmD and providers to maintain LDL goal < 100 . Current regimen:  o Pravastatin 10 mg daily o Aspirin 81 mg daily . Interventions: o Patient reports good adherence with medication.  o Denies current symptoms.  . Patient self care activities - Over the next 90 days, patient will: o Continue to take medication as prescribed.   COPD  . Pharmacist Clinical Goal(s) o Over the next 90 days, patient will work with PharmD and providers to manage COPD.  . Current regimen:  o Breztril 2 puffs twice daily o Albuterol inhaler prn  . Interventions: o Patient reports improved symptom control with Breztri. Receiving from patient assistance.  o Pharmacist will coordinate enrollment for 2022 year.  . Patient self care activities - Over the next 90 days, patient will: o Reach out to [employer] for smoking cessation help when ready.  o Continue using Breztri 2 puffs twice daily.   Medication management . Pharmacist Clinical Goal(s): o Over the next 90 days, patient will work with PharmD and providers to maintain optimal medication adherence . Current pharmacy: [facility] . Interventions o Comprehensive medication review performed. o Continue current medication management strategy . Patient self care activities - Over the next 90 days, patient will: o Focus on medication adherence by continuing to use pill box o Take medications as prescribed o Report any questions or concerns to PharmD and/or provider(s)  Please see past updates  related to this goal by clicking on the "Past Updates" button in the selected goal         COPD / Asthma / Tobacco   Last spirometry score: 62.1% 08/2017  Gold Grade: Gold 2 (FEV1 50-79%)  Eosinophil count:  No results found for: EOSPCT%                               Eos (Absolute):  Lab Results  Component Value Date/Time   EOSABS 0.2 02/09/2020 11:30 AM    Tobacco Status:  Social History   Tobacco Use  Smoking Status Current Every Day Smoker  . Packs/day: 0.50  . Types: Cigarettes  Smokeless Tobacco Never Used    Patient has failed these meds in past: trelegy inhaler samples Patient is currently uncontrolled on the following medications:   Albuterol 1.25 mg/3 ml qid prn for nebulizer Using maintenance inhaler regularly?  No Frequency of rescue inhaler use:  never  We discussed:  proper inhaler technique.   Feels better with Breztri. Has cut back on smoking in the last few months but not ready to quit. Encouraged patient of benefits of smoking cessation. Pharmacist will coordinate [facility] application and approval for 2022.   Plan  Continue current medications.    and  Hypertension   Office blood pressures are  BP Readings from Last 3 Encounters:  02/09/20 104/64  01/04/20 110/68  11/03/19 140/90   Kidney Function Lab Results  Component Value Date/Time   CREATININE 0.82 02/09/2020 11:30 AM   CREATININE 0.82 01/04/2020 03:23 PM   GFRNONAA 73 02/09/2020 11:30 AM   GFRAA 84 02/09/2020 11:30 AM   K 4.1 02/09/2020 11:30 AM   K 4.3 01/04/2020 03:23 PM    Patient has failed these meds in the past: none recalled Patient is currently controlled on the following medications:   hydralazine 100 mg twice daily with food  Irbesartan 300 mg daily   Propanolol er 120 mg daily  Patient checks BP at home when feeling symptomatic  Patient home BP readings are ranging: not checking often  We discussed diet and exercise extensively.   BP improved per patient  reports. Patient feels that her weight is stable but doesn't have scale at home. Still thinks she has some issues with digestion.   Plan  Continue current medications   Hyperlipidemia   LDL goal < 100  Lipid Panel     Component Value Date/Time   CHOL 169 02/09/2020 1130   TRIG 112 02/09/2020 1130   HDL 57 02/09/2020 1130   LDLCALC 92 02/09/2020 1130    Hepatic Function Latest Ref Rng & Units 02/09/2020 01/04/2020 11/03/2019  Total Protein 6.0 - 8.5 g/dL 6.9 6.7 6.6  Albumin 3.8 - 4.8 g/dL 4.3 4.1 3.9  AST 0 - 40 IU/L 22 27 17   ALT 0 - 32 IU/L 11 19 7   Alk Phosphatase 48 - 121 IU/L 70 72 72  Total Bilirubin 0.0 - 1.2 mg/dL 0.8 0.6 1.2     The 10-year ASCVD risk score Mikey Bussing DC Jr., et al., 2013) is: 15.5%   Values used to calculate the score:     Age: 71 years     Sex: Female     Is Non-Hispanic African American: No     Diabetic: No     Tobacco smoker: Yes     Systolic Blood Pressure: 681 mmHg     Is BP treated: Yes     HDL Cholesterol: 57 mg/dL     Total Cholesterol: 169 mg/dL   Patient has failed these meds in past: n/a Patient is currently controlled on the following medications:  . Aspirin 81 mg daily . Pravastatin 10 mg daily  We discussed:  diet and exercise extensively. Patient is taking medication as prescribed. Feels that her diet is stable but has to be careful due to digestion issue.   Plan  Continue current medications  Depression   Patient has failed these meds in past: mirtazapine  Patient is currently controlled on the following medications:  . fluoxetine 10 mg daily . Trazodone 50 mg at bedtime as needed  We discussed:  Patient reports sleeping well. Denies anxiety or depression symptoms at this time. Feels that her weight is stable.   Plan  Continue current medications  Osteopenia / Osteoporosis   Last DEXA Scan:  not listed in chart  No results found for: [facility]  Patient has failed these meds in past: n/a Patient is currently  uncontrolled on the following medications:  . n/a  We discussed:  Recommend [phone] units of vitamin D daily. Recommend 1200 mg of calcium daily from dietary and supplemental sources. Recommend weight-bearing and muscle strengthening exercises for building and maintaining bone density.  Plan  Begin Calcium and Vitamin D supplementation. Recommend updated Dexa Scan and Vitamin D level with next labs.   Tobacco Abuse   Tobacco Status:  Social History   Tobacco Use  Smoking Status Current Every Day Smoker  . Packs/day: 0.50  . Types: Cigarettes  Smokeless Tobacco Never Used    Patient has failed these meds in past: bupropion Patient is currently uncontrolled on the following medications:  . n/a  We discussed:  Counseled on patch placement, side effects, and option to remove at night if they experience trouble sleeping or bad dreams.  Provided contact information for [facility] [facility] (1-800-QUIT-NOW) and encouraged patient to reach out to this group for support.   Patient denies interest in smoking cessation at this time. She states that she has reduced her daily intake. Encouraged patient of the benefits of smoking cessation.   Plan  Recommend patient reach out to the [place] [facility] when ready to quit.    Health Maintenance   Patient is currently controlled on the following medications:  . Biotin 1000 mcg daily supplement for hair, skin and nails . Ibuprofen 200 mg 4 tablets prn back pain - takes maybe once a week  We discussed:  Discussed eating when taking ibuprofen to avoid irritation of stomach.   Plan  Continue current medications  Vaccines   Reviewed and discussed patient's vaccination history. Patient has not had a COVID vaccine and doesn't want to take it right now. Patient has not had shingles vaccine due to cost. Patient cannot remember last tetanus shot.   Immunization History  Administered Date(s) Administered  . Fluad Quad(high Dose 65+) 02/09/2020  .  Influenza-Unspecified 01/28/2019  . Pneumococcal Conjugate-13 02/01/2016  . Pneumococcal Polysaccharide-23 03/02/2014, 02/09/2020    Plan  Recommended patient receive annual flu vaccine in office.   Medication Management   Pt uses [facility] pharmacy for all medications Uses pill box? Yes Pt endorses excellent compliance  We discussed: Patient denies issues with mail order at this time. Will continue to receive Breztri through [facility] for 2022.   Plan  Continue current medication management strategy    Follow up: 3 month phone visit

## 2020-04-16 ENCOUNTER — Other Ambulatory Visit: Payer: Self-pay | Admitting: Family Medicine

## 2020-04-16 ENCOUNTER — Telehealth: Payer: Self-pay

## 2020-04-16 DIAGNOSIS — J449 Chronic obstructive pulmonary disease, unspecified: Secondary | ICD-10-CM

## 2020-04-16 MED ORDER — BUDESONIDE-FORMOTEROL FUMARATE 160-4.5 MCG/ACT IN AERO: 2.0000 | INHALATION_SPRAY | Freq: Two times a day (BID) | RESPIRATORY_TRACT | 3 refills | Status: DC

## 2020-04-16 NOTE — Chronic Care Management (AMB) (Signed)
Patient reports that she has stopped Breztri due to hoarseness. Patient reports that hoarseness has improved since stopping treatment but lungs are not doing well.   She would like Dr. Tobie Poet to recommend something else for breathing. Patient is currently approved through [facility] and [facility] to receive Breztri through patient assistance. Can coordinate application for an alternative agent if needed.   Sherre Poot, PharmD, [facility] Clinical Pharmacist [facility] [phone] (office) [phone] (mobile)

## 2020-05-03 ENCOUNTER — Ambulatory Visit: Payer: Medicare Other | Admitting: Family Medicine

## 2020-05-03 ENCOUNTER — Ambulatory Visit: Payer: Medicare Other

## 2020-05-13 ENCOUNTER — Encounter: Payer: Self-pay | Admitting: Family Medicine

## 2020-05-16 ENCOUNTER — Ambulatory Visit: Payer: Medicare Other | Admitting: Family Medicine

## 2020-06-25 ENCOUNTER — Other Ambulatory Visit: Payer: Self-pay | Admitting: Family Medicine

## 2020-06-26 ENCOUNTER — Telehealth: Payer: Self-pay

## 2020-06-26 NOTE — Progress Notes (Signed)
    Chronic Care Management Pharmacy Assistant   Name: Cecila Satcher  MRN: [id number] DOB: 29-Sep-1950  Reason for Encounter: General adherence call Patient Questions:  1.  Have you seen any other providers since your last visit? No  2.  Any changes in your medicines or health? No     PCP : Rochel Brome, MD  Allergies:   Allergies  Allergen Reactions  . Penicillin G Rash  . Sulfamethoxazole Rash    Medications: Outpatient Encounter Medications as of 06/26/2020  Medication Sig  . albuterol (ACCUNEB) 1.25 MG/3ML nebulizer solution Take 1 ampule by nebulization every 6 (six) hours as needed for wheezing.  Marland Kitchen albuterol (PROAIR HFA) 108 (90 Base) MCG/ACT inhaler Inhale 2 puffs into the lungs every 6 (six) hours as needed for wheezing or shortness of breath.  Marland Kitchen aspirin 81 MG chewable tablet Chew 81 mg by mouth daily.  . Biotin 10000 MCG TABS Take by mouth daily.   . budesonide-formoterol (SYMBICORT) 160-4.5 MCG/ACT inhaler Inhale 2 puffs into the lungs 2 (two) times daily.  Marland Kitchen FLUoxetine (PROZAC) 10 MG tablet Take 1 tablet by mouth once daily  . hydrALAZINE (APRESOLINE) 100 MG tablet TAKE 1 TABLET BY MOUTH  TWICE DAILY WITH FOOD  . ibuprofen (ADVIL) 200 MG tablet Take 800 mg by mouth every 6 (six) hours as needed. Takes 4 tablets as needed for back pain  . irbesartan (AVAPRO) 300 MG tablet TAKE 1 TABLET BY MOUTH ONCE DAILY  . pravastatin (PRAVACHOL) 10 MG tablet TAKE 1 TABLET BY MOUTH ONCE DAILY  . propranolol ER (INDERAL LA) 120 MG 24 hr capsule TAKE 1 CAPSULE BY MOUTH  DAILY  . traZODone (DESYREL) 50 MG tablet Take 1 tablet (50 mg total) by mouth at bedtime as needed.   No facility-administered encounter medications on file as of 06/26/2020.    Current Diagnosis: Patient Active Problem List   Diagnosis Date Noted  . Abnormal weight loss 01/04/2020  . Nodule of left lung 01/04/2020  . Centrilobular emphysema ([place]) 01/04/2020  . Chronic obstructive bronchitis ([place])  11/12/2019  . Mild protein-calorie malnutrition ([place]) 11/12/2019  . Depression, major, recurrent, mild ([place]) 11/12/2019  . Mixed hyperlipidemia 11/02/2019  . Essential hypertension 11/02/2019   Spoke with patient, she stated she is not having any current issues.  She stated her appetite is still low, but she is making sure she gets enough salt.   Patient declined any weakness or dizziness.    Patient declined any issues with medication or side effects.  She stated her activity level as far as being outside has decreased with the colder temperatures but still active with daily living needs.   She will let us know if she has any changes.  Follow-Up:  Pharmacist Review  Donette Larry, CPP notified  Clarita Leber, [place] Pharmacist Assistant [phone]

## 2020-07-11 ENCOUNTER — Other Ambulatory Visit: Payer: Self-pay | Admitting: Family Medicine

## 2020-07-25 ENCOUNTER — Other Ambulatory Visit: Payer: Self-pay | Admitting: Family Medicine

## 2020-08-06 ENCOUNTER — Other Ambulatory Visit: Payer: Self-pay | Admitting: Family Medicine

## 2020-08-15 NOTE — Progress Notes (Signed)
Subjective:  Patient ID: Misty Evans, female    DOB: June 30, 1950  Age: 70 y.o. MRN: [id number]  Chief Complaint  Patient presents with  . Hyperlipidemia  . Hypertension    HPI Chronic obstructive bronchitis ([place]) Pt was started on breztri but caused hoarseness, so discontinued it and now is on albuterol MDI. Uses albuterol 2-3 times per day. No chest pain. No coughing  Previously took symbicort.  Essential hypertension Takes irbesartan 300 mg daily, hydralazine 100 mg twice a day and propranolol ER 120 mg once daily. Bp still high. Running 130s/ 80s at home.   Mixed hyperlipidemia Taking pravastatin  Depression, major, recurrent, mild (HCC) Taking prozac  Weight loss: 84 lbs. Decreased by 6 lbs since September.   PATIENT IS DUE FOR A COLOGUARD-OK TO ORDER??? INFORM KIM.   Current Outpatient Medications on File Prior to Visit  Medication Sig Dispense Refill  . albuterol (ACCUNEB) 1.25 MG/3ML nebulizer solution Take 1 ampule by nebulization every 6 (six) hours as needed for wheezing.    Marland Kitchen albuterol (VENTOLIN HFA) 108 (90 Base) MCG/ACT inhaler USE 2 INHALATIONS BY MOUTH  EVERY 6 HOURS AS NEEDED FOR WHEEZING OR SHORTNESS OF  BREATH 34 g 3  . aspirin 81 MG chewable tablet Chew 81 mg by mouth daily.    . Biotin 10000 MCG TABS Take by mouth daily.     . budesonide-formoterol (SYMBICORT) 160-4.5 MCG/ACT inhaler Inhale 2 puffs into the lungs 2 (two) times daily. 3 each 3  . FLUoxetine (PROZAC) 10 MG tablet Take 1 tablet by mouth once daily 90 tablet 1  . hydrALAZINE (APRESOLINE) 100 MG tablet TAKE 1 TABLET BY MOUTH  TWICE DAILY WITH FOOD 180 tablet 0  . ibuprofen (ADVIL) 200 MG tablet Take 800 mg by mouth every 6 (six) hours as needed. Takes 4 tablets as needed for back pain    . irbesartan (AVAPRO) 300 MG tablet TAKE 1 TABLET BY MOUTH ONCE DAILY 90 tablet 3  . pravastatin (PRAVACHOL) 10 MG tablet TAKE 1 TABLET BY MOUTH ONCE DAILY 90 tablet 1  . propranolol ER (INDERAL LA)  120 MG 24 hr capsule TAKE 1 CAPSULE BY MOUTH  DAILY 90 capsule 1   No current facility-administered medications on file prior to visit.   Past Medical History:  Diagnosis Date  . Depression   . Insomnia   . Osteoporosis   . Renal stones 05/2015   Past Surgical History:  Procedure Laterality Date  . ABDOMINAL HYSTERECTOMY    . APPENDECTOMY    . BACK SURGERY    . CHOLECYSTECTOMY      Family History  Problem Relation Age of Onset  . Hypertension Mother   . Cancer Father        esophageal  . Hypertension Father   . Hypertension Maternal Grandmother   . Heart attack Maternal Grandmother   . Diabetes Paternal Grandmother   . Hypertension Paternal Grandmother    Social History   Socioeconomic History  . Marital status: Married    Spouse name: Not on file  . Number of children: 2  . Years of education: Not on file  . Highest education level: Not on file  Occupational History  . Not on file  Tobacco Use  . Smoking status: Current Every Day Smoker    Packs/day: 0.25    Types: Cigarettes  . Smokeless tobacco: Never Used  Substance and Sexual Activity  . Alcohol use: Not Currently  . Drug use: Never  . Sexual activity:  Not on file  Other Topics Concern  . Not on file  Social History Narrative  . Not on file   Social Determinants of Health   Financial Resource Strain: Not on file  Food Insecurity: Not on file  Transportation Needs: Not on file  Physical Activity: Not on file  Stress: Not on file  Social Connections: Not on file    Review of Systems  Constitutional: Positive for fatigue and unexpected weight change. Negative for chills and fever.  HENT: Positive for rhinorrhea and voice change. Negative for congestion, ear pain and sore throat.   Respiratory: Positive for shortness of breath. Negative for cough.   Cardiovascular: Negative for chest pain.  Gastrointestinal: Positive for diarrhea (no appetite. yellow watery. 1-3 BMs daily. ) and nausea. Negative  for abdominal pain, constipation and vomiting.  Endocrine: Negative for polydipsia, polyphagia and polyuria.  Genitourinary: Negative for dysuria and urgency.  Musculoskeletal: Positive for back pain (baseline. ). Negative for arthralgias and myalgias.  Skin:       No funny moles  Neurological: Negative for dizziness, weakness, light-headedness and headaches.  Psychiatric/Behavioral: Negative for dysphoric mood. The patient is not nervous/anxious.      Objective:  BP 134/80   Pulse 63   Temp (!) 95.7 F (35.4 C)   Ht 5' (1.524 m)   Wt 84 lb 3.2 oz (38.2 kg)   SpO2 90% Comment: went as low as 88 while sitting  BMI 16.44 kg/m   BP/Weight 08/17/2020 07/08/7122 10/07/996  Systolic BP 338 250 539  Diastolic BP 80 64 68  Wt. (Lbs) 84.2 90.8 90.4  BMI 16.44 17.73 17.66    Physical Exam Vitals reviewed.  Constitutional:      Appearance: Normal appearance.     Comments: thin  Neck:     Vascular: No carotid bruit.  Cardiovascular:     Rate and Rhythm: Normal rate and regular rhythm.     Heart sounds: Normal heart sounds.  Pulmonary:     Effort: Pulmonary effort is normal.     Breath sounds: No stridor. Wheezing present. No rhonchi.  Abdominal:     General: Abdomen is flat. Bowel sounds are normal.     Palpations: Abdomen is soft.     Tenderness: There is no abdominal tenderness.  Neurological:     Mental Status: She is alert and oriented to person, place, and time.  Psychiatric:        Mood and Affect: Mood normal.        Behavior: Behavior normal.     Diabetic Foot Exam - Simple   No data filed      Lab Results  Component Value Date   WBC 9.4 08/17/2020   HGB 16.3 (H) 08/17/2020   HCT 50.8 (H) 08/17/2020   PLT 267 08/17/2020   GLUCOSE 81 08/17/2020   CHOL 150 08/17/2020   TRIG 107 08/17/2020   HDL 49 08/17/2020   LDLCALC 81 08/17/2020   ALT 19 08/17/2020   AST 22 08/17/2020   NA 143 08/17/2020   K 4.3 08/17/2020   CL 100 08/17/2020   CREATININE 0.71  08/17/2020   BUN 14 08/17/2020   CO2 26 08/17/2020   TSH 2.080 08/17/2020      Assessment & Plan:   1. Chronic obstructive bronchitis (HCC) - Pulmonary function test: Severe obstruction. - order ct scan of lungs. - Referral to pulmonology. - Restart breztri until we get symbicort approved for pt assistance. Recommend quit smoking.  Used nicotine  lozenges or nicotine gum.  Good job decreasing to 5 cigarettes/day.   2. Essential hypertension Well controlled.  No changes to medicines.  Continue to work on eating a healthy diet and exercise.  Labs drawn today.  - Comprehensive metabolic panel - Cardiovascular Risk Assessment  3. Mixed hyperlipidemia Well controlled.  No changes to medicines.  Continue to work on eating a healthy diet and exercise.  Labs drawn today.  - CBC with Differential/Platelet - Lipid panel  4. Depression, major, recurrent, mild ([place]) Start on mirtazepine to help with weight loss. - mirtazapine (REMERON SOL-TAB) 15 MG disintegrating tablet; Take 1 tablet (15 mg total) by mouth at bedtime.  Dispense: 30 tablet; Refill: 2  5. Weight loss Recommend 3 meals per day. Recommended protein drinks, but pt has not liked them previously Start on mirtazepine. - T4, free - TSH - B12 and Folate Panel - Methylmalonic acid, serum - mirtazapine (REMERON SOL-TAB) 15 MG disintegrating tablet; Take 1 tablet (15 mg total) by mouth at bedtime.  Dispense: 30 tablet; Refill: 2  6. Mild vitamin D deficiency - VITAMIN D 25 Hydroxy (Vit-D Deficiency, Fractures)  7. Diarrhea due to malabsorption - B12 and Folate Panel - Methylmalonic acid, serum - Cdiff NAA+O+P+Stool Culture - Referral to GI  8. Mild protein-calorie malnutrition ([place]) Referral to GI.  9. Osteoporosis without current pathological fracture, unspecified osteoporosis type Check on DEXA. Ordered last 12/2019, but I do not see results.   Meds ordered this encounter  Medications  . mirtazapine (REMERON  SOL-TAB) 15 MG disintegrating tablet    Sig: Take 1 tablet (15 mg total) by mouth at bedtime.    Dispense:  30 tablet    Refill:  2    Orders Placed This Encounter  Procedures  . Cdiff NAA+O+P+Stool Culture  . CBC with Differential/Platelet  . Comprehensive metabolic panel  . Lipid panel  . T4, free  . TSH  . VITAMIN D 25 Hydroxy (Vit-D Deficiency, Fractures)  . B12 and Folate Panel  . Methylmalonic acid, serum  . Cardiovascular Risk Assessment  . Pulmonary function test      Follow-up: Return in about 4 weeks (around 09/14/2020) for chronic follow up..  An After Visit Summary was printed and given to the patient.  Rochel Brome, MD [facility] [phone]

## 2020-08-17 ENCOUNTER — Ambulatory Visit (INDEPENDENT_AMBULATORY_CARE_PROVIDER_SITE_OTHER): Payer: Medicare Other | Admitting: Family Medicine

## 2020-08-17 ENCOUNTER — Encounter: Payer: Self-pay | Admitting: Family Medicine

## 2020-08-17 ENCOUNTER — Other Ambulatory Visit: Payer: Self-pay

## 2020-08-17 VITALS — BP 134/80 | HR 63 | Temp 95.7°F | Ht 60.0 in | Wt 84.2 lb

## 2020-08-17 DIAGNOSIS — F330 Major depressive disorder, recurrent, mild: Secondary | ICD-10-CM | POA: Diagnosis not present

## 2020-08-17 DIAGNOSIS — E559 Vitamin D deficiency, unspecified: Secondary | ICD-10-CM | POA: Diagnosis not present

## 2020-08-17 DIAGNOSIS — J449 Chronic obstructive pulmonary disease, unspecified: Secondary | ICD-10-CM

## 2020-08-17 DIAGNOSIS — Z1231 Encounter for screening mammogram for malignant neoplasm of breast: Secondary | ICD-10-CM | POA: Diagnosis not present

## 2020-08-17 DIAGNOSIS — E441 Mild protein-calorie malnutrition: Secondary | ICD-10-CM

## 2020-08-17 DIAGNOSIS — R634 Abnormal weight loss: Secondary | ICD-10-CM

## 2020-08-17 DIAGNOSIS — K909 Intestinal malabsorption, unspecified: Secondary | ICD-10-CM | POA: Diagnosis not present

## 2020-08-17 DIAGNOSIS — I10 Essential (primary) hypertension: Secondary | ICD-10-CM | POA: Diagnosis not present

## 2020-08-17 DIAGNOSIS — E782 Mixed hyperlipidemia: Secondary | ICD-10-CM | POA: Diagnosis not present

## 2020-08-17 DIAGNOSIS — M810 Age-related osteoporosis without current pathological fracture: Secondary | ICD-10-CM

## 2020-08-17 DIAGNOSIS — J4489 Other specified chronic obstructive pulmonary disease: Secondary | ICD-10-CM

## 2020-08-17 DIAGNOSIS — R197 Diarrhea, unspecified: Secondary | ICD-10-CM

## 2020-08-17 LAB — PULMONARY FUNCTION TEST

## 2020-08-17 MED ORDER — MIRTAZAPINE 15 MG PO TBDP
15.0000 mg | ORAL_TABLET | Freq: Every day | ORAL | 2 refills | Status: DC
Start: 2020-08-17 — End: 2020-10-23

## 2020-08-17 NOTE — Patient Instructions (Signed)
Ordering labs. Recommend 3 meals per day. Start on mirtazapine 15 mg once at nighttime for appetite and hopefully to improve sleep.  Discontinue trazodone. Ordering GI referral for colonoscopy. Ordering pulmonary referral for severe COPD. Recommend quit smoking.  Used nicotine lozenges or nicotine gum.  Good job decreasing to 5 cigarettes/day. Ordering mammogram. Restart breztri until we can get symbicort approved this. Return stool studies for diarrhea work-up.

## 2020-08-21 LAB — COMPREHENSIVE METABOLIC PANEL
ALT: 19 IU/L (ref 0–32)
AST: 22 IU/L (ref 0–40)
Albumin/Globulin Ratio: 1.3 (ref 1.2–2.2)
Albumin: 3.9 g/dL (ref 3.7–4.7)
Alkaline Phosphatase: 107 IU/L (ref 44–121)
BUN/Creatinine Ratio: 20 (ref 12–28)
BUN: 14 mg/dL (ref 8–27)
Bilirubin Total: 1.2 mg/dL (ref 0.0–1.2)
CO2: 26 mmol/L (ref 20–29)
Calcium: 9.9 mg/dL (ref 8.7–10.3)
Chloride: 100 mmol/L (ref 96–106)
Creatinine, Ser: 0.71 mg/dL (ref 0.57–1.00)
Globulin, Total: 3 g/dL (ref 1.5–4.5)
Glucose: 81 mg/dL (ref 65–99)
Potassium: 4.3 mmol/L (ref 3.5–5.2)
Sodium: 143 mmol/L (ref 134–144)
Total Protein: 6.9 g/dL (ref 6.0–8.5)
eGFR: 91 mL/min/{1.73_m2} (ref >59)

## 2020-08-21 LAB — PULMONARY FUNCTION TEST

## 2020-08-21 LAB — CBC WITH DIFFERENTIAL/PLATELET
Basophils Absolute: 0.1 10*3/uL (ref 0.0–0.2)
Basos: 1 %
EOS (ABSOLUTE): 0.1 10*3/uL (ref 0.0–0.4)
Eos: 1 %
Hematocrit: 50.8 % — ABNORMAL HIGH (ref 34.0–46.6)
Hemoglobin: 16.3 g/dL — ABNORMAL HIGH (ref 11.1–15.9)
Immature Grans (Abs): 0.1 10*3/uL (ref 0.0–0.1)
Immature Granulocytes: 1 %
Lymphocytes Absolute: 1.3 10*3/uL (ref 0.7–3.1)
Lymphs: 14 %
MCH: 28.8 pg (ref 26.6–33.0)
MCHC: 32.1 g/dL (ref 31.5–35.7)
MCV: 90 fL (ref 79–97)
Monocytes Absolute: 0.5 10*3/uL (ref 0.1–0.9)
Monocytes: 5 %
Neutrophils Absolute: 7.3 10*3/uL — ABNORMAL HIGH (ref 1.4–7.0)
Neutrophils: 78 %
Platelets: 267 10*3/uL (ref 150–450)
RBC: 5.66 x10E6/uL — ABNORMAL HIGH (ref 3.77–5.28)
RDW: 13.1 % (ref 11.7–15.4)
WBC: 9.4 10*3/uL (ref 3.4–10.8)

## 2020-08-21 LAB — T4, FREE: Free T4: 1.29 ng/dL (ref 0.82–1.77)

## 2020-08-21 LAB — LIPID PANEL
Chol/HDL Ratio: 3.1 ratio (ref 0.0–4.4)
Cholesterol, Total: 150 mg/dL (ref 100–199)
HDL: 49 mg/dL (ref >39)
LDL Chol Calc (NIH): 81 mg/dL (ref 0–99)
Triglycerides: 107 mg/dL (ref 0–149)
VLDL Cholesterol Cal: 20 mg/dL (ref 5–40)

## 2020-08-21 LAB — METHYLMALONIC ACID, SERUM: Methylmalonic Acid: 274 nmol/L (ref 0–378)

## 2020-08-21 LAB — CARDIOVASCULAR RISK ASSESSMENT

## 2020-08-21 LAB — B12 AND FOLATE PANEL
Folate: 9.1 ng/mL (ref >3.0)
Vitamin B-12: 731 pg/mL (ref 232–1245)

## 2020-08-21 LAB — TSH: TSH: 2.08 u[IU]/mL (ref 0.450–4.500)

## 2020-08-21 LAB — VITAMIN D 25 HYDROXY (VIT D DEFICIENCY, FRACTURES): Vit D, 25-Hydroxy: 30.9 ng/mL (ref 30.0–100.0)

## 2020-09-02 ENCOUNTER — Encounter: Payer: Self-pay | Admitting: Family Medicine

## 2020-09-03 ENCOUNTER — Other Ambulatory Visit: Payer: Self-pay

## 2020-09-03 ENCOUNTER — Other Ambulatory Visit: Payer: Self-pay | Admitting: Family Medicine

## 2020-09-03 DIAGNOSIS — J432 Centrilobular emphysema: Secondary | ICD-10-CM

## 2020-09-03 DIAGNOSIS — M810 Age-related osteoporosis without current pathological fracture: Secondary | ICD-10-CM

## 2020-09-03 NOTE — Progress Notes (Unsigned)
Pt called questioning these orders. Did not see new ones therefore pended new if appropriate. Please advise.   Misty Evans, [place] 09/03/20 11:43 AM

## 2020-09-03 NOTE — Progress Notes (Signed)
Thank you. kc

## 2020-09-04 ENCOUNTER — Telehealth: Payer: Self-pay

## 2020-09-04 NOTE — Telephone Encounter (Signed)
Patient aware os DEXA at [facility] on 09/19/2020 at 09:00.

## 2020-09-18 ENCOUNTER — Other Ambulatory Visit: Payer: Self-pay | Admitting: Family Medicine

## 2020-09-24 ENCOUNTER — Ambulatory Visit (INDEPENDENT_AMBULATORY_CARE_PROVIDER_SITE_OTHER): Payer: Medicare Other | Admitting: Family Medicine

## 2020-09-24 ENCOUNTER — Encounter: Payer: Self-pay | Admitting: Family Medicine

## 2020-09-24 ENCOUNTER — Telehealth: Payer: Self-pay

## 2020-09-24 ENCOUNTER — Ambulatory Visit
Admission: RE | Admit: 2020-09-24 | Discharge: 2020-09-24 | Disposition: A | Discharge status: Home / Self Care | Payer: Medicare Other | Source: Ambulatory Visit | Attending: Family Medicine | Admitting: Family Medicine

## 2020-09-24 ENCOUNTER — Other Ambulatory Visit: Payer: Self-pay

## 2020-09-24 VITALS — BP 106/58 | HR 60 | Temp 97.6°F | Resp 18 | Ht 60.0 in | Wt 84.8 lb

## 2020-09-24 DIAGNOSIS — J432 Centrilobular emphysema: Secondary | ICD-10-CM | POA: Diagnosis not present

## 2020-09-24 DIAGNOSIS — R197 Diarrhea, unspecified: Secondary | ICD-10-CM | POA: Diagnosis not present

## 2020-09-24 DIAGNOSIS — F17218 Nicotine dependence, cigarettes, with other nicotine-induced disorders: Secondary | ICD-10-CM

## 2020-09-24 DIAGNOSIS — Z1231 Encounter for screening mammogram for malignant neoplasm of breast: Secondary | ICD-10-CM | POA: Diagnosis not present

## 2020-09-24 DIAGNOSIS — E782 Mixed hyperlipidemia: Secondary | ICD-10-CM | POA: Diagnosis not present

## 2020-09-24 DIAGNOSIS — M810 Age-related osteoporosis without current pathological fracture: Secondary | ICD-10-CM | POA: Diagnosis not present

## 2020-09-24 DIAGNOSIS — I10 Essential (primary) hypertension: Secondary | ICD-10-CM

## 2020-09-24 DIAGNOSIS — E441 Mild protein-calorie malnutrition: Secondary | ICD-10-CM | POA: Diagnosis not present

## 2020-09-24 DIAGNOSIS — F330 Major depressive disorder, recurrent, mild: Secondary | ICD-10-CM

## 2020-09-24 NOTE — Progress Notes (Signed)
Subjective:  Patient ID: Misty Evans, female    DOB: 11/08/1950  Age: 70 y.o. MRN: [id number]  Chief Complaint  Patient presents with  . Hypertension  . abnormal weight loss    HPI  Chronic obstructive bronchitis (HCC) PFT showed severe obstruction. Trelegy has helped more than anything. Given breztri sample and has been taking one puff inhaler daily. She has not received appointment for pulmonology yet.  Continued DOE.  Pt smoking.  She is considering the patches.  She is smoking 5 cigarettes a day.   Depression, major, recurrent, mild ([name]) on fluoxetine 10 mg once daily and mirtazapine. Started on mirtazepine to help with weight loss at hre last visit. Pt has gained 10 oz. she is drinking Carnation breakfast drinks.  She does not tolerate protein drinks  Hypertension: Currently on irbesartan 300 mg once daily hydralazine 100 mg twice daily, and propranolol extended release 120 mg once daily.  Hyperlipidemia: Currently on pravastatin 10 mg once daily. Diarrhea: diarrhea resolved prior to being able to get stool studies.  Would still like to see GI due to belching and weight loss. She had not heard from [place] about the referral to GI.  Osteoporosis without current pathological fracture,  DEXA scheduled tomorrow.  Current Outpatient Medications on File Prior to Visit  Medication Sig Dispense Refill  . Budeson-Glycopyrrol-Formoterol (BREZTRI AEROSPHERE) 160-9-4.8 MCG/ACT AERO Inhale 1 application into the lungs in the morning and at bedtime.    Marland Kitchen albuterol (ACCUNEB) 1.25 MG/3ML nebulizer solution Take 1 ampule by nebulization every 6 (six) hours as needed for wheezing.    Marland Kitchen albuterol (VENTOLIN HFA) 108 (90 Base) MCG/ACT inhaler USE 2 INHALATIONS BY MOUTH  EVERY 6 HOURS AS NEEDED FOR WHEEZING OR SHORTNESS OF  BREATH 34 g 3  . aspirin 81 MG chewable tablet Chew 81 mg by mouth daily.    . Biotin 10000 MCG TABS Take by mouth daily.     Marland Kitchen FLUoxetine (PROZAC) 10 MG tablet Take 1  tablet by mouth once daily 90 tablet 1  . hydrALAZINE (APRESOLINE) 100 MG tablet TAKE 1 TABLET BY MOUTH  TWICE DAILY WITH FOOD 180 tablet 3  . ibuprofen (ADVIL) 200 MG tablet Take 800 mg by mouth every 6 (six) hours as needed. Takes 4 tablets as needed for back pain    . irbesartan (AVAPRO) 300 MG tablet TAKE 1 TABLET BY MOUTH ONCE DAILY 90 tablet 3  . mirtazapine (REMERON SOL-TAB) 15 MG disintegrating tablet Take 1 tablet (15 mg total) by mouth at bedtime. 30 tablet 2  . pravastatin (PRAVACHOL) 10 MG tablet TAKE 1 TABLET BY MOUTH ONCE DAILY 90 tablet 1  . propranolol ER (INDERAL LA) 120 MG 24 hr capsule TAKE 1 CAPSULE BY MOUTH  DAILY 90 capsule 1   No current facility-administered medications on file prior to visit.   Past Medical History:  Diagnosis Date  . Depression   . Insomnia   . Osteoporosis   . Renal stones 05/2015   Past Surgical History:  Procedure Laterality Date  . ABDOMINAL HYSTERECTOMY    . APPENDECTOMY    . BACK SURGERY    . CHOLECYSTECTOMY      Family History  Problem Relation Age of Onset  . Hypertension Mother   . Cancer Father        esophageal  . Hypertension Father   . Hypertension Maternal Grandmother   . Heart attack Maternal Grandmother   . Diabetes Paternal Grandmother   . Hypertension Paternal Grandmother  Social History   Socioeconomic History  . Marital status: Married    Spouse name: Not on file  . Number of children: 2  . Years of education: Not on file  . Highest education level: Not on file  Occupational History  . Not on file  Tobacco Use  . Smoking status: Current Every Day Smoker    Packs/day: 0.25    Types: Cigarettes  . Smokeless tobacco: Never Used  Substance and Sexual Activity  . Alcohol use: Not Currently  . Drug use: Never  . Sexual activity: Not on file  Other Topics Concern  . Not on file  Social History Narrative  . Not on file   Social Determinants of Health   Financial Resource Strain: Not on file  Food  Insecurity: Not on file  Transportation Needs: Not on file  Physical Activity: Not on file  Stress: Not on file  Social Connections: Not on file    Review of Systems  Constitutional: Positive for fatigue and unexpected weight change. Negative for chills and fever.  HENT: Positive for congestion and rhinorrhea. Negative for ear pain and sore throat.   Respiratory: Positive for shortness of breath. Negative for cough.   Cardiovascular: Negative for chest pain.  Gastrointestinal: Positive for nausea. Negative for abdominal pain, constipation, diarrhea and vomiting.  Genitourinary: Negative for dysuria and urgency.  Musculoskeletal: Positive for back pain. Negative for arthralgias and myalgias.  Skin: Negative for rash.  Neurological: Positive for headaches (sinus). Negative for dizziness.  Psychiatric/Behavioral: Negative for dysphoric mood. The patient is not nervous/anxious.      Objective:  BP (!) 106/58   Pulse 60   Temp 97.6 F (36.4 C)   Resp 18   Ht 5' (1.524 m)   Wt 84 lb 12.8 oz (38.5 kg)   BMI 16.56 kg/m   BP/Weight 09/24/2020 01/31/5175 06/08/735  Systolic BP 106 269 485  Diastolic BP 58 80 64  Wt. (Lbs) 84.8 84.2 90.8  BMI 16.56 16.44 17.73    Physical Exam  Diabetic Foot Exam - Simple   No data filed      Lab Results  Component Value Date   WBC 9.4 08/17/2020   HGB 16.3 (H) 08/17/2020   HCT 50.8 (H) 08/17/2020   PLT 267 08/17/2020   GLUCOSE 81 08/17/2020   CHOL 150 08/17/2020   TRIG 107 08/17/2020   HDL 49 08/17/2020   LDLCALC 81 08/17/2020   ALT 19 08/17/2020   AST 22 08/17/2020   NA 143 08/17/2020   K 4.3 08/17/2020   CL 100 08/17/2020   CREATININE 0.71 08/17/2020   BUN 14 08/17/2020   CO2 26 08/17/2020   TSH 2.080 08/17/2020      Assessment & Plan:   1. Centrilobular emphysema ([place]) Not at goal. Recommended breztri 2 puffs twice daily.  Trelegy is very expensive. I will discuss with our pharmacist to see if we can get her trelegy on  pt assistance.  Working on pulmonary referral.   2. Osteoporosis without current pathological fracture, unspecified osteoporosis type Await bone density for recommendations.   3. Essential hypertension The current medical regimen is effective;  continue present plan and medications.  4. Mixed hyperlipidemia The current medical regimen is effective;  continue present plan and medications.  5. Depression, major, recurrent, mild ([place]) The current medical regimen is effective;  continue present plan and medications.  6. Mild protein-calorie malnutrition ([place]) Improved slightly. Encouraged increased calorie intake. Pt prefers not to increase mirtazepine at  this time.  7. Cigarette nicotine dependence with other nicotine-induced disorder Recommend trial on nicoderm patches 7 mg daily.  8. Diarrhea, unspecified type GI referral done today.  Follow-up: Return in about 14 weeks (around 12/31/2020) for fasting.  An After Visit Summary was printed and given to the patient.  Rochel Brome, MD [facility] [phone]

## 2020-09-24 NOTE — Telephone Encounter (Signed)
   Misty Evans has been scheduled for the following appointment:  WHAT: Chest CT WO WHERE: [place] DATE: 09/25/20 TIME: 8:45 am  Patient has been made aware.

## 2020-09-25 ENCOUNTER — Telehealth: Payer: Self-pay

## 2020-09-25 DIAGNOSIS — J432 Centrilobular emphysema: Secondary | ICD-10-CM | POA: Diagnosis not present

## 2020-09-25 DIAGNOSIS — I700 Atherosclerosis of aorta: Secondary | ICD-10-CM | POA: Diagnosis not present

## 2020-09-25 DIAGNOSIS — M810 Age-related osteoporosis without current pathological fracture: Secondary | ICD-10-CM | POA: Diagnosis not present

## 2020-09-25 LAB — HM DEXA SCAN: HM Dexa Scan: ABNORMAL

## 2020-09-25 NOTE — Telephone Encounter (Signed)
Called to let patient know she is due for Cologuard and see if it is OK to reorder.

## 2020-09-26 ENCOUNTER — Other Ambulatory Visit: Payer: Self-pay | Admitting: Family Medicine

## 2020-09-26 ENCOUNTER — Encounter: Payer: Self-pay | Admitting: Family Medicine

## 2020-09-26 DIAGNOSIS — F330 Major depressive disorder, recurrent, mild: Secondary | ICD-10-CM

## 2020-09-26 MED ORDER — FLUOXETINE HCL 10 MG PO TABS: 10.0000 mg | ORAL_TABLET | Freq: Every day | ORAL | 3 refills | Status: DC

## 2020-09-28 ENCOUNTER — Telehealth: Payer: Self-pay

## 2020-09-28 NOTE — Progress Notes (Signed)
    Chronic Care Management Pharmacy Assistant   Name: Misty Evans  MRN: [id number] DOB: 10-08-1950  Reason for Encounter: Medication Review for Trelegy PAP    Medications: Outpatient Encounter Medications as of 09/28/2020  Medication Sig  . albuterol (ACCUNEB) 1.25 MG/3ML nebulizer solution Take 1 ampule by nebulization every 6 (six) hours as needed for wheezing.  Marland Kitchen albuterol (VENTOLIN HFA) 108 (90 Base) MCG/ACT inhaler USE 2 INHALATIONS BY MOUTH  EVERY 6 HOURS AS NEEDED FOR WHEEZING OR SHORTNESS OF  BREATH  . aspirin 81 MG chewable tablet Chew 81 mg by mouth daily.  . Biotin 10000 MCG TABS Take by mouth daily.   . Budeson-Glycopyrrol-Formoterol (BREZTRI AEROSPHERE) 160-9-4.8 MCG/ACT AERO Inhale 1 application into the lungs in the morning and at bedtime.  Marland Kitchen FLUoxetine (PROZAC) 10 MG tablet Take 1 tablet (10 mg total) by mouth daily.  . hydrALAZINE (APRESOLINE) 100 MG tablet TAKE 1 TABLET BY MOUTH  TWICE DAILY WITH FOOD  . ibuprofen (ADVIL) 200 MG tablet Take 800 mg by mouth every 6 (six) hours as needed. Takes 4 tablets as needed for back pain  . irbesartan (AVAPRO) 300 MG tablet TAKE 1 TABLET BY MOUTH ONCE DAILY  . mirtazapine (REMERON SOL-TAB) 15 MG disintegrating tablet Take 1 tablet (15 mg total) by mouth at bedtime.  . pravastatin (PRAVACHOL) 10 MG tablet TAKE 1 TABLET BY MOUTH ONCE DAILY  . propranolol ER (INDERAL LA) 120 MG 24 hr capsule TAKE 1 CAPSULE BY MOUTH  DAILY   No facility-administered encounter medications on file as of 09/28/2020.   Donette Larry, CPP wanted me to reach out to patient as to her Trelegy status, she had mentioned to Dr. Tobie Poet that she wanted to go back on that medication.  I called the patient she stated she likes the Trelegy beter due to her forgetting to take her Judithann Sauger for the second time daily.    I asked the patient if she had met the $600 out of pocket, she stated she did not think so at this time.  I advised her to apply for assistance she  would have to meet that requirement.  The patient asked if there as another once a day alternative.  I asked Donette Larry, CPP and she said there was not another medication available.  I advised the patient as to her options, which are to pay for Trelegy or continue on Breztri till her $25 is met.  She is going to call her insurance to see what her co-pay will be.  I have notified Donette Larry, [place], [place] Pharmacist Assistant [phone]

## 2020-10-01 ENCOUNTER — Other Ambulatory Visit: Payer: Self-pay | Admitting: Family Medicine

## 2020-10-01 DIAGNOSIS — F330 Major depressive disorder, recurrent, mild: Secondary | ICD-10-CM

## 2020-10-23 ENCOUNTER — Other Ambulatory Visit: Payer: Self-pay

## 2020-10-23 ENCOUNTER — Encounter: Payer: Self-pay | Admitting: Gastroenterology

## 2020-10-23 ENCOUNTER — Other Ambulatory Visit (INDEPENDENT_AMBULATORY_CARE_PROVIDER_SITE_OTHER): Payer: Medicare Other

## 2020-10-23 ENCOUNTER — Ambulatory Visit (INDEPENDENT_AMBULATORY_CARE_PROVIDER_SITE_OTHER): Payer: Medicare Other | Admitting: Gastroenterology

## 2020-10-23 VITALS — BP 142/86 | HR 64 | Ht 60.0 in

## 2020-10-23 DIAGNOSIS — R634 Abnormal weight loss: Secondary | ICD-10-CM

## 2020-10-23 DIAGNOSIS — K219 Gastro-esophageal reflux disease without esophagitis: Secondary | ICD-10-CM | POA: Diagnosis not present

## 2020-10-23 DIAGNOSIS — R110 Nausea: Secondary | ICD-10-CM

## 2020-10-23 DIAGNOSIS — R1032 Left lower quadrant pain: Secondary | ICD-10-CM

## 2020-10-23 DIAGNOSIS — R197 Diarrhea, unspecified: Secondary | ICD-10-CM

## 2020-10-23 MED ORDER — PANTOPRAZOLE SODIUM 20 MG PO TBEC: 20.0000 mg | DELAYED_RELEASE_TABLET | Freq: Every day | ORAL | 11 refills | Status: DC

## 2020-10-23 NOTE — Progress Notes (Signed)
Chief Complaint:   Referring Provider:  Rochel Brome, MD      ASSESSMENT AND PLAN;   #1. GERD with nausea  #2. LLQ pain   #3. Wt loss. Neg CT Chest 09/2020 for any masses.  #4. Intermittent diarrhea (resolved)  #5. Multiple comorbid conditions including COPD (not on home O2) with continued smoking, anxiety/depression, insomnia, HTN. HLD, osteoporosis   Plan: -Protonix 20mg  po qd #30, 11 refiils -CT AP with PO/IV contrast -Check CBC, CMP, CRP, celiac screen -EGD/colon after pulm clearence (She is to see pulm tomorrow) @ [facility]. -Quit smoking.   I discussed the nature of the recommended EGD/Colonoscopy , as well as the indications, risks, alternatives and potential complications including, but not limited to, bleeding, infection, reaction to medication, damage to internal organs, cardiac and/or pulmonary problems, and perforation requiring surgery (1 to 2 in 1000). The possibility that significant findings could be missed was explained. All ? were answered. The patient gives consent for the procedures.   HPI:    Misty Evans is a 70 y.o. female  With advanced COPD (not on home O2) with continued smoking, anxiety/depression, insomnia, HTN. HLD, osteoporosis  Referred to GI clinic for intermittent LLQ pain, intermittent diarrhea, intermittent belching and weight loss (129lb to 87lb over 5 yrs)  -Had left lower quadrant abdominal discomfort with bloating which gets partially relieved by defecation.  No fever chills or night sweats.  No melena or hematochezia.  -Has been having intermittent diarrhea which has resolved currently.  -Belching continues along with history suggestive of ?  Early satiety.  She had nausea over last 2 years without vomiting.  She does feel full right after eating.  -Never had EGD.  Had colonoscopy more than 10 years ago in [place] which was negative per patient.  I could not find any records in epic.  She also had neg Cologuard in  2018.  No sodas, chocolates, chewing gums, artificial sweeteners and candy. No NSAIDs.  No alcohol.   CT chest 09/2020-severe emphysema with PAH.  Has appointment with pulmonary tomorrow.  Dad had polyps.  No family member had colon cancer otherwise.  Past Medical History:  Diagnosis Date  . Depression   . Insomnia   . Osteoporosis   . Renal stones 05/2015    Past Surgical History:  Procedure Laterality Date  . ABDOMINAL HYSTERECTOMY    . APPENDECTOMY    . BACK SURGERY    . CHOLECYSTECTOMY      Family History  Problem Relation Age of Onset  . Hypertension Mother   . Cancer Father        esophageal  . Hypertension Father   . Hypertension Maternal Grandmother   . Heart attack Maternal Grandmother   . Diabetes Paternal Grandmother   . Hypertension Paternal Grandmother   . Colon cancer Neg Hx   . Pancreatic cancer Neg Hx     Social History   Tobacco Use  . Smoking status: Current Every Day Smoker    Packs/day: 0.25    Types: Cigarettes  . Smokeless tobacco: Never Used  Vaping Use  . Vaping Use: Never used  Substance Use Topics  . Alcohol use: Not Currently  . Drug use: Never    Current Outpatient Medications  Medication Sig Dispense Refill  . albuterol (ACCUNEB) 1.25 MG/3ML nebulizer solution Take 1 ampule by nebulization every 6 (six) hours as needed for wheezing.    Marland Kitchen albuterol (VENTOLIN HFA) 108 (90 Base) MCG/ACT inhaler USE 2 INHALATIONS BY  MOUTH  EVERY 6 HOURS AS NEEDED FOR WHEEZING OR SHORTNESS OF  BREATH 34 g 3  . aspirin 81 MG chewable tablet Chew 81 mg by mouth daily.    . Biotin 10000 MCG TABS Take by mouth daily.     . Budeson-Glycopyrrol-Formoterol (BREZTRI AEROSPHERE) 160-9-4.8 MCG/ACT AERO Inhale 1 application into the lungs in the morning and at bedtime.    Marland Kitchen FLUoxetine (PROZAC) 10 MG tablet Take 1 tablet by mouth once daily 90 tablet 1  . hydrALAZINE (APRESOLINE) 100 MG tablet TAKE 1 TABLET BY MOUTH  TWICE DAILY WITH FOOD 180 tablet 3  .  ibuprofen (ADVIL) 200 MG tablet Take 800 mg by mouth every 6 (six) hours as needed. Takes 4 tablets as needed for back pain    . irbesartan (AVAPRO) 300 MG tablet TAKE 1 TABLET BY MOUTH ONCE DAILY 90 tablet 3  . pravastatin (PRAVACHOL) 10 MG tablet TAKE 1 TABLET BY MOUTH ONCE DAILY 90 tablet 1  . propranolol ER (INDERAL LA) 120 MG 24 hr capsule TAKE 1 CAPSULE BY MOUTH  DAILY 90 capsule 1  . vitamin B-12 (CYANOCOBALAMIN) 1000 MCG tablet Take 1,000 mcg by mouth daily.     No current facility-administered medications for this visit.    Allergies  Allergen Reactions  . Penicillin G Rash  . Sulfamethoxazole Rash    Review of Systems:  Constitutional: Denies fever, chills, diaphoresis, appetite change and has fatigue.  HEENT: Denies photophobia, eye pain, redness, hearing loss, ear pain, congestion, sore throat, rhinorrhea, sneezing, mouth sores, neck pain, neck stiffness and tinnitus.   Respiratory: Has SOB, DOE, No cough, chest tightness,  and wheezing.   Cardiovascular: Denies chest pain, palpitations and leg swelling.  Genitourinary: Denies dysuria, urgency, frequency, hematuria, flank pain and difficulty urinating.  Musculoskeletal: Denies myalgias, has back pain, joint swelling, arthralgias and gait problem.  Skin: No rash.  Neurological: Denies dizziness, seizures, syncope, weakness, light-headedness, numbness and headaches.  Hematological: Denies adenopathy. Easy bruising, personal or family bleeding history  Psychiatric/Behavioral: Has anxiety or depression     Physical Exam:    BP (!) 142/86   Pulse 64   Ht 5' (1.524 m)   SpO2 97%   BMI 16.56 kg/m  Wt Readings from Last 3 Encounters:  09/24/20 84 lb 12.8 oz (38.5 kg)  08/17/20 84 lb 3.2 oz (38.2 kg)  02/09/20 90 lb 12.8 oz (41.2 kg)   Constitutional: Thin built white female, in no acute distress. Psychiatric: Normal mood and affect. Behavior is normal. HEENT: Pupils normal.  Conjunctivae are normal. No scleral  icterus. Cardiovascular: Normal rate, regular rhythm. No edema Pulmonary/chest: Bilateral decreased breath sounds.  No wheezing, rales or rhonchi. Abdominal: Soft, nondistended.  Mild left lower quadrant abdominal tenderness without rebound.. Bowel sounds active throughout. There are no masses palpable. No hepatomegaly. Rectal: Deferred Neurological: Alert and oriented to person place and time. Skin: Skin is warm and dry. No rashes noted.  Data Reviewed: I have personally reviewed following labs and imaging studies  CBC: CBC Latest Ref Rng & Units 08/17/2020 02/09/2020 01/04/2020  WBC 3.4 - 10.8 x10E3/uL 9.4 10.6 10.2  Hemoglobin 11.1 - 15.9 g/dL 16.3(H) 15.8 15.2  Hematocrit 34.0 - 46.6 % 50.8(H) 48.4(H) 46.0  Platelets 150 - 450 x10E3/uL 267 267 273    CMP: CMP Latest Ref Rng & Units 08/17/2020 02/09/2020 01/04/2020  Glucose 65 - 99 mg/dL 81 84 84  BUN 8 - 27 mg/dL 14 19 24   Creatinine 0.57 - 1.00 mg/dL 0.71  0.82 0.82  Sodium 134 - 144 mmol/L 143 140 139  Potassium 3.5 - 5.2 mmol/L 4.3 4.1 4.3  Chloride 96 - 106 mmol/L 100 101 100  CO2 20 - 29 mmol/L 26 26 26   Calcium 8.7 - 10.3 mg/dL 9.9 9.7 9.6  Total Protein 6.0 - 8.5 g/dL 6.9 6.9 6.7  Total Bilirubin 0.0 - 1.2 mg/dL 1.2 0.8 0.6  Alkaline Phos 44 - 121 IU/L 107 70 72  AST 0 - 40 IU/L 22 22 27   ALT 0 - 32 IU/L 19 11 19        Carmell Austria, MD 10/23/2020, 1:39 PM  Cc: Rochel Brome, MD

## 2020-10-23 NOTE — Patient Instructions (Addendum)
If you are age 71 or older, your body mass index should be between 23-30. Your Body mass index is 16.56 kg/m. If this is out of the aforementioned range listed, please consider follow up with your Primary Care Provider.  If you are age 18 or younger, your body mass index should be between 19-25. Your Body mass index is 16.56 kg/m. If this is out of the aformentioned range listed, please consider follow up with your Primary Care Provider.   It has been recommended to you by your physician that you have a(n) EGD/Colon completed. Per your request, we did not schedule the procedure(s) today. Please contact our office at [phone] should you decide to have the procedure completed. You will be scheduled for a pre-visit and procedure at that time.  We have sent the following medications to your pharmacy for you to pick up at your convenience: Protonix 6m  Please go to the lab on the 2nd floor [address] before you leave the office today.   You have been scheduled for a CT scan of the abdomen and pelvis at [facility] scheduled on       at           . You should arrive 15 minutes prior to your appointment time for registration. Please follow the written instructions below on the day of your exam:  WARNING: IF YOU ARE ALLERGIC TO IODINE/X-RAY DYE, PLEASE NOTIFY RADIOLOGY IMMEDIATELY AT [phone] YOU WILL BE GIVEN A 13 HOUR PREMEDICATION PREP.  1) Do not eat or drink anything after        (4 hours prior to your test) 2) You have been given 2 bottles of oral contrast to drink. The solution may taste better if refrigerated, but do NOT add ice or any other liquid to this solution. Shake well before drinking.    Drink 1 bottle of contrast @        (2 hours prior to your exam)  Drink 1 bottle of contrast @       (1 hour prior to your exam)  You may take any medications as prescribed with a small amount of water, if necessary. If you take any of the following medications:  METFORMIN, GLUCOPHAGE, GLUCOVANCE, AVANDAMET, RIOMET, FORTAMET, ACordovaMET, JANUMET, GLUMETZA or METAGLIP, you MAY be asked to HOLD this medication 48 hours AFTER the exam.  The purpose of you drinking the oral contrast is to aid in the visualization of your intestinal tract. The contrast solution may cause some diarrhea. Depending on your individual set of symptoms, you may also receive an intravenous injection of x-ray contrast/dye. [facility] depending on the type of exam you are having performed.  This test typically takes 30-45 minutes to complete.  If you have any questions regarding your exam or if you need to reschedule, you may call the CT department at [phone] 7 between the hours of 8:00 am and 5:00 pm, Monday-Friday.  ________________________________________________________________________  Thank you,  Dr. RJackquline Denmark

## 2020-10-24 ENCOUNTER — Ambulatory Visit: Payer: Medicare Other | Admitting: Pulmonary Disease

## 2020-10-24 ENCOUNTER — Encounter: Payer: Self-pay | Admitting: Pulmonary Disease

## 2020-10-24 VITALS — BP 118/74 | HR 76 | Temp 97.3°F | Ht 60.0 in | Wt 87.6 lb

## 2020-10-24 DIAGNOSIS — R0602 Shortness of breath: Secondary | ICD-10-CM

## 2020-10-24 DIAGNOSIS — J432 Centrilobular emphysema: Secondary | ICD-10-CM | POA: Diagnosis not present

## 2020-10-24 NOTE — Progress Notes (Signed)
Misty Evans    [id number]    1951/04/20  Primary Care Physician:Cox, Elnita Maxwell, MD  Referring Physician: Rochel Brome, MD [address] [place],  [place] [address]  Chief complaint:   Asked to see patient for COPD, concern for pulmonary hypertension  HPI:  Patient with known chronic obstructive pulmonary disease on Trelegy samples at present, did use Breztri previously Will not be able to afford Trelegy long-term Was receiving Breztri with patient assistance  She does have shortness of breath with activity Occasional cough, not really bringing up secretions on a regular basis Get short of breath with moderate exertion  History of hypertension, emphysema, hypercholesterolemia, allergies, back surgery with chronic back pain persisting  Smokes about half a pack a day, smoked heavily in the past  No pertinent occupational history  Dad did have emphysema   Outpatient Encounter Medications as of 10/24/2020  Medication Sig  . albuterol (ACCUNEB) 1.25 MG/3ML nebulizer solution Take 1 ampule by nebulization every 6 (six) hours as needed for wheezing.  Marland Kitchen albuterol (VENTOLIN HFA) 108 (90 Base) MCG/ACT inhaler USE 2 INHALATIONS BY MOUTH  EVERY 6 HOURS AS NEEDED FOR WHEEZING OR SHORTNESS OF  BREATH  . aspirin 81 MG chewable tablet Chew 81 mg by mouth daily.  . Biotin 10000 MCG TABS Take by mouth daily.   . Budeson-Glycopyrrol-Formoterol (BREZTRI AEROSPHERE) 160-9-4.8 MCG/ACT AERO Inhale 1 application into the lungs in the morning and at bedtime.  Marland Kitchen FLUoxetine (PROZAC) 10 MG tablet Take 1 tablet by mouth once daily  . hydrALAZINE (APRESOLINE) 100 MG tablet TAKE 1 TABLET BY MOUTH  TWICE DAILY WITH FOOD  . ibuprofen (ADVIL) 200 MG tablet Take 800 mg by mouth every 6 (six) hours as needed. Takes 4 tablets as needed for back pain  . irbesartan (AVAPRO) 300 MG tablet TAKE 1 TABLET BY MOUTH ONCE DAILY  . pantoprazole (PROTONIX) 20 MG tablet Take 1 tablet (20 mg  total) by mouth daily.  . pravastatin (PRAVACHOL) 10 MG tablet TAKE 1 TABLET BY MOUTH ONCE DAILY  . propranolol ER (INDERAL LA) 120 MG 24 hr capsule TAKE 1 CAPSULE BY MOUTH  DAILY  . vitamin B-12 (CYANOCOBALAMIN) 1000 MCG tablet Take 1,000 mcg by mouth daily.   No facility-administered encounter medications on file as of 10/24/2020.    Allergies as of 10/24/2020 - Review Complete 10/24/2020  Allergen Reaction Noted  . Penicillin g Rash 11/09/2018  . Sulfamethoxazole Rash 11/09/2018    Past Medical History:  Diagnosis Date  . Depression   . Insomnia   . Osteoporosis   . Renal stones 05/2015    Past Surgical History:  Procedure Laterality Date  . ABDOMINAL HYSTERECTOMY    . APPENDECTOMY    . BACK SURGERY    . CHOLECYSTECTOMY      Family History  Problem Relation Age of Onset  . Hypertension Mother   . Cancer Father        esophageal  . Hypertension Father   . Hypertension Maternal Grandmother   . Heart attack Maternal Grandmother   . Diabetes Paternal Grandmother   . Hypertension Paternal Grandmother   . Colon cancer Neg Hx   . Pancreatic cancer Neg Hx     Social History   Socioeconomic History  . Marital status: Married    Spouse name: Not on file  . Number of children: 2  . Years of education: Not on file  . Highest education level: Not on  file  Occupational History  . Occupation: Retired  Tobacco Use  . Smoking status: Current Every Day Smoker    Packs/day: 0.25    Types: Cigarettes  . Smokeless tobacco: Never Used  Vaping Use  . Vaping Use: Never used  Substance and Sexual Activity  . Alcohol use: Not Currently  . Drug use: Never  . Sexual activity: Not on file  Other Topics Concern  . Not on file  Social History Narrative  . Not on file   Social Determinants of Health   Financial Resource Strain: Not on file  Food Insecurity: Not on file  Transportation Needs: Not on file  Physical Activity: Not on file  Stress: Not on file  Social  Connections: Not on file  Intimate Partner Violence: Not on file    Review of Systems  Respiratory: Positive for cough and shortness of breath.   Psychiatric/Behavioral: Negative for sleep disturbance.    Vitals:   10/24/20 1436  BP: 118/74  Pulse: 76  Temp: (!) 97.3 F (36.3 C)  SpO2: 93%     Physical Exam Constitutional:      Appearance: Normal appearance.  HENT:     Head: Normocephalic and atraumatic.     Right Ear: There is no impacted cerumen.     Left Ear: There is no impacted cerumen.     Nose: No congestion or rhinorrhea.     Mouth/Throat:     Mouth: Mucous membranes are moist.  Eyes:     General:        Left eye: Discharge present. Cardiovascular:     Rate and Rhythm: Normal rate and regular rhythm.     Heart sounds: No murmur heard.   Pulmonary:     Effort: No respiratory distress.     Breath sounds: No stridor. No wheezing or rhonchi.     Comments: Poor air movement bilaterally Musculoskeletal:     Cervical back: No rigidity or tenderness.  Psychiatric:        Mood and Affect: Mood normal.    Data Reviewed: CT scan was reviewed with the patient CT showing extensive emphysema, large pulmonary arteries  Assessment:  Advanced chronic obstructive pulmonary disease  Severe panlobular emphysema  Concern for pulmonary hypertension  Shortness of breath on exertion  Plan/Recommendations: Obtain echocardiogram  Will request for a copy of pulmonary function test from your primary doctor's office  Continue to work on quitting smoking  Once you are done with samples of Trelegy, I would encourage you to go back to [place] as this may be more affordable  for you long-term  Graded exercise as tolerated  Inhaler technique was reviewed and encouraged regarding how to use it  Sherrilyn Rist MD [facility] 10/24/2020, 3:03 PM  CC: Rochel Brome, MD

## 2020-10-24 NOTE — Patient Instructions (Signed)
Exercise as tolerated  Continue with the samples of Trelegy but once done go back to using Breztri Always pay attention to how you are using the inhaler to make sure you are getting it into your lungs  Continue to work on quitting smoking  We will get an echocardiogram  We will request for a copy of your breathing study from your primary doctor  I will see you in 3 months

## 2020-10-25 LAB — CBC WITH DIFFERENTIAL/PLATELET
Basophils Absolute: 0.1 10*3/uL (ref 0.0–0.2)
Basos: 1 %
EOS (ABSOLUTE): 0.1 10*3/uL (ref 0.0–0.4)
Eos: 2 %
Hematocrit: 50.1 % — ABNORMAL HIGH (ref 34.0–46.6)
Hemoglobin: 16 g/dL — ABNORMAL HIGH (ref 11.1–15.9)
Immature Grans (Abs): 0.1 10*3/uL (ref 0.0–0.1)
Immature Granulocytes: 1 %
Lymphocytes Absolute: 1.4 10*3/uL (ref 0.7–3.1)
Lymphs: 22 %
MCH: 28 pg (ref 26.6–33.0)
MCHC: 31.9 g/dL (ref 31.5–35.7)
MCV: 88 fL (ref 79–97)
Monocytes Absolute: 0.8 10*3/uL (ref 0.1–0.9)
Monocytes: 12 %
Neutrophils Absolute: 3.9 10*3/uL (ref 1.4–7.0)
Neutrophils: 62 %
Platelets: 219 10*3/uL (ref 150–450)
RBC: 5.71 x10E6/uL — ABNORMAL HIGH (ref 3.77–5.28)
RDW: 13.2 % (ref 11.7–15.4)
WBC: 6.4 10*3/uL (ref 3.4–10.8)

## 2020-10-25 LAB — COMPREHENSIVE METABOLIC PANEL
ALT: 11 IU/L (ref 0–32)
AST: 23 IU/L (ref 0–40)
Albumin/Globulin Ratio: 1.5 (ref 1.2–2.2)
Albumin: 4.4 g/dL (ref 3.7–4.7)
Alkaline Phosphatase: 81 IU/L (ref 44–121)
BUN/Creatinine Ratio: 18 (ref 12–28)
BUN: 17 mg/dL (ref 8–27)
Bilirubin Total: 0.4 mg/dL (ref 0.0–1.2)
CO2: 23 mmol/L (ref 20–29)
Calcium: 9.8 mg/dL (ref 8.7–10.3)
Chloride: 102 mmol/L (ref 96–106)
Creatinine, Ser: 0.92 mg/dL (ref 0.57–1.00)
Globulin, Total: 2.9 g/dL (ref 1.5–4.5)
Glucose: 83 mg/dL (ref 65–99)
Potassium: 4.3 mmol/L (ref 3.5–5.2)
Sodium: 141 mmol/L (ref 134–144)
Total Protein: 7.3 g/dL (ref 6.0–8.5)
eGFR: 67 mL/min/{1.73_m2} (ref >59)

## 2020-10-25 LAB — CELIAC PANEL 10
Antigliadin Abs, IgA: 2 units (ref 0–19)
Endomysial IgA: NEGATIVE
Gliadin IgG: 1 units (ref 0–19)
IgA/Immunoglobulin A, Serum: 201 mg/dL (ref 64–422)
Tissue Transglut Ab: <2 U/mL (ref 0–5)
Transglutaminase IgA: <2 U/mL (ref 0–3)

## 2020-10-25 LAB — C-REACTIVE PROTEIN: CRP: 2 mg/L (ref 0–10)

## 2020-10-31 DIAGNOSIS — R109 Unspecified abdominal pain: Secondary | ICD-10-CM | POA: Diagnosis not present

## 2020-10-31 DIAGNOSIS — K219 Gastro-esophageal reflux disease without esophagitis: Secondary | ICD-10-CM | POA: Diagnosis not present

## 2020-10-31 DIAGNOSIS — R110 Nausea: Secondary | ICD-10-CM | POA: Diagnosis not present

## 2020-10-31 DIAGNOSIS — R1032 Left lower quadrant pain: Secondary | ICD-10-CM | POA: Diagnosis not present

## 2020-10-31 DIAGNOSIS — R634 Abnormal weight loss: Secondary | ICD-10-CM | POA: Diagnosis not present

## 2020-10-31 DIAGNOSIS — R1110 Vomiting, unspecified: Secondary | ICD-10-CM | POA: Diagnosis not present

## 2020-10-31 DIAGNOSIS — R197 Diarrhea, unspecified: Secondary | ICD-10-CM | POA: Diagnosis not present

## 2020-11-06 ENCOUNTER — Telehealth: Payer: Self-pay

## 2020-11-06 NOTE — Progress Notes (Signed)
Chronic Care Management Pharmacy Assistant   Name: Misty Evans  MRN: [id number] DOB: 12-24-1950   Reason for Encounter: Disease State for COPD   Recent office visits:  11/13/20-MR of abdomen ordered, referral for gastroenterology   10/24/20-pulmonology- Patient with known chronic obstructive pulmonary disease on Trelegy samples at present, did use Breztri previously Will not be able to afford Trelegy long-term Was receiving Breztri with patient assistance  She does have shortness of breath with activity Occasional cough, not really bringing up secretions on a regular basis Get short of breath with moderate exertion Smokes about half a pack a day, smoked heavily in the past  CT showing extensive emphysema, large pulmonary arteries  10/23/20-Gastroenterology, CT of abdomen, Labs, start Pantoprazole, colonoscopy ordered.   Recent consult visits:  none  Hospital visits:  None in previous 6 months  Medications: Outpatient Encounter Medications as of 11/06/2020  Medication Sig   albuterol (ACCUNEB) 1.25 MG/3ML nebulizer solution Take 1 ampule by nebulization every 6 (six) hours as needed for wheezing.   albuterol (VENTOLIN HFA) 108 (90 Base) MCG/ACT inhaler USE 2 INHALATIONS BY MOUTH  EVERY 6 HOURS AS NEEDED FOR WHEEZING OR SHORTNESS OF  BREATH   aspirin 81 MG chewable tablet Chew 81 mg by mouth daily.   Biotin 10000 MCG TABS Take by mouth daily.    Budeson-Glycopyrrol-Formoterol (BREZTRI AEROSPHERE) 160-9-4.8 MCG/ACT AERO Inhale 1 application into the lungs in the morning and at bedtime.   FLUoxetine (PROZAC) 10 MG tablet Take 1 tablet by mouth once daily   hydrALAZINE (APRESOLINE) 100 MG tablet TAKE 1 TABLET BY MOUTH  TWICE DAILY WITH FOOD   ibuprofen (ADVIL) 200 MG tablet Take 800 mg by mouth every 6 (six) hours as needed. Takes 4 tablets as needed for back pain   irbesartan (AVAPRO) 300 MG tablet TAKE 1 TABLET BY MOUTH ONCE DAILY   pantoprazole (PROTONIX) 20 MG  tablet Take 1 tablet (20 mg total) by mouth daily.   pravastatin (PRAVACHOL) 10 MG tablet TAKE 1 TABLET BY MOUTH ONCE DAILY   propranolol ER (INDERAL LA) 120 MG 24 hr capsule TAKE 1 CAPSULE BY MOUTH  DAILY   vitamin B-12 (CYANOCOBALAMIN) 1000 MCG tablet Take 1,000 mcg by mouth daily.   No facility-administered encounter medications on file as of 11/06/2020.     Current COPD regimen:  Breztri and Nebulizer Trelegy (samples given in Pulmonology office)   Albuterol  No flowsheet data found.  Any recent hospitalizations or ED visits since last visit with CPP? No  reports the following COPD symptoms, including Increased shortness of breath but is better with the Trelegy   What recent interventions/DTPs have been made by any provider to improve breathing since last visit: Trelegy is helping with symptoms, patient is trying to quit smoking.     Have you had exacerbation/flare-up since last visit? No  What do you do when you are short of breath?  Rest  Current tobacco use? Current smoker 1/2pk a day Interested in cessation? She is trying to quit   Respiratory Devices/Equipment Do you have a nebulizer? Yes Do you use a Peak Flow Meter? No Do you use a maintenance inhaler? Yes How often do you forget to use your daily inhaler? No Do you use a rescue inhaler? Yes How often do you use your rescue inhaler?  As needed  Do you use a spacer with your inhaler? No  Adherence Review: Does the patient have >5 day gap between last estimated fill date for  maintenance inhaler medications? CPP to review   [facility] Medications confirmed by [facility] Irbesartan 09/07/20  90  Pravastatin 08/23/20 [place], [place] Pharmacist Assistant [phone]

## 2020-11-12 ENCOUNTER — Telehealth: Payer: Self-pay

## 2020-11-12 ENCOUNTER — Telehealth: Payer: Self-pay | Admitting: Gastroenterology

## 2020-11-12 NOTE — Telephone Encounter (Signed)
Pt calling for results of CT scan that was done at [facility]. Have you seen these results?

## 2020-11-12 NOTE — Telephone Encounter (Signed)
Inbound call from patient requesting results from CT best contact number [phone]

## 2020-11-12 NOTE — Telephone Encounter (Signed)
This patient was seen by you on 5-25 and I am wondering if you have cleared her to have an EGD/Colon at our [facility] or if she needs to be done at a hospital or if she isn't cleared to have any procedures at this time? Please advise. Thank you

## 2020-11-12 NOTE — Telephone Encounter (Addendum)
Patient needs a nonemergent renal protocol MRI without and with contrast ordered by her PCP but no acute abnormality. Sent report to PCP and notified them (spoke with Suanne Marker) about this and  patient knows as well. Working on clearance for EGD/Colon from pulmonary

## 2020-11-13 ENCOUNTER — Other Ambulatory Visit: Payer: Self-pay

## 2020-11-13 ENCOUNTER — Other Ambulatory Visit: Payer: Self-pay | Admitting: Physician Assistant

## 2020-11-13 DIAGNOSIS — K219 Gastro-esophageal reflux disease without esophagitis: Secondary | ICD-10-CM

## 2020-11-13 DIAGNOSIS — R634 Abnormal weight loss: Secondary | ICD-10-CM

## 2020-11-13 DIAGNOSIS — R1032 Left lower quadrant pain: Secondary | ICD-10-CM

## 2020-11-13 DIAGNOSIS — R110 Nausea: Secondary | ICD-10-CM

## 2020-11-13 DIAGNOSIS — N2889 Other specified disorders of kidney and ureter: Secondary | ICD-10-CM

## 2020-11-13 DIAGNOSIS — Z1211 Encounter for screening for malignant neoplasm of colon: Secondary | ICD-10-CM

## 2020-11-13 NOTE — Telephone Encounter (Signed)
Thank you. Patient scheduled for 8-3 at 230pm and her instructions has been mailed to verified address and she is suppose to call with any questions.

## 2020-11-14 ENCOUNTER — Encounter: Payer: Self-pay | Admitting: Family Medicine

## 2020-11-28 NOTE — Telephone Encounter (Signed)
Pharmacist submitted application for Breztri to [employer] patient assistance for 2022.   Sherre Poot, PharmD, [facility] Clinical Pharmacist [facility] [phone] (office) [phone] (mobile)

## 2020-11-29 ENCOUNTER — Other Ambulatory Visit: Payer: Self-pay | Admitting: Family Medicine

## 2020-11-30 ENCOUNTER — Telehealth: Payer: Self-pay

## 2020-11-30 NOTE — Progress Notes (Signed)
    Chronic Care Management Pharmacy Assistant   Name: Misty Evans  MRN: [id number] DOB: 1951-05-03  Reason for Encounter: Medication Review for Breztri PAP status     Medications: Outpatient Encounter Medications as of 11/30/2020  Medication Sig   albuterol (ACCUNEB) 1.25 MG/3ML nebulizer solution Take 1 ampule by nebulization every 6 (six) hours as needed for wheezing.   albuterol (VENTOLIN HFA) 108 (90 Base) MCG/ACT inhaler USE 2 INHALATIONS BY MOUTH  EVERY 6 HOURS AS NEEDED FOR WHEEZING OR SHORTNESS OF  BREATH   aspirin 81 MG chewable tablet Chew 81 mg by mouth daily.   Biotin 10000 MCG TABS Take by mouth daily.    Budeson-Glycopyrrol-Formoterol (BREZTRI AEROSPHERE) 160-9-4.8 MCG/ACT AERO Inhale 1 application into the lungs in the morning and at bedtime.   FLUoxetine (PROZAC) 10 MG tablet Take 1 tablet by mouth once daily   hydrALAZINE (APRESOLINE) 100 MG tablet TAKE 1 TABLET BY MOUTH  TWICE DAILY WITH FOOD   ibuprofen (ADVIL) 200 MG tablet Take 800 mg by mouth every 6 (six) hours as needed. Takes 4 tablets as needed for back pain   irbesartan (AVAPRO) 300 MG tablet TAKE 1 TABLET BY MOUTH ONCE DAILY   pantoprazole (PROTONIX) 20 MG tablet Take 1 tablet (20 mg total) by mouth daily.   pravastatin (PRAVACHOL) 10 MG tablet TAKE 1 TABLET BY MOUTH ONCE DAILY   propranolol ER (INDERAL LA) 120 MG 24 hr capsule TAKE 1 CAPSULE BY MOUTH  DAILY   vitamin B-12 (CYANOCOBALAMIN) 1000 MCG tablet Take 1,000 mcg by mouth daily.   No facility-administered encounter medications on file as of 11/30/2020.   Representative is going to process her form for approval, she will get a notification along with her husband at renewal time, a new application is not needed its a 3 question process.  Providers can go online and do this as well for them, this is only for patients with Medicare.  She is approved, representative will call and get shipping done for them  Spoke to patient to let her know the  company will be calling to set up shipment.    Clarita Leber, [place] Pharmacist Assistant [phone]

## 2020-12-11 ENCOUNTER — Telehealth: Payer: Medicare Other

## 2020-12-31 ENCOUNTER — Ambulatory Visit: Payer: Medicare Other | Admitting: Family Medicine

## 2021-01-02 ENCOUNTER — Encounter: Payer: Self-pay | Admitting: Gastroenterology

## 2021-01-02 ENCOUNTER — Other Ambulatory Visit: Payer: Self-pay

## 2021-01-02 ENCOUNTER — Ambulatory Visit (AMBULATORY_SURGERY_CENTER): Payer: Medicare Other | Admitting: Gastroenterology

## 2021-01-02 VITALS — BP 140/78 | HR 61 | Temp 98.0°F | Resp 24 | Ht 60.0 in | Wt 87.0 lb

## 2021-01-02 DIAGNOSIS — K5730 Diverticulosis of large intestine without perforation or abscess without bleeding: Secondary | ICD-10-CM

## 2021-01-02 DIAGNOSIS — D120 Benign neoplasm of cecum: Secondary | ICD-10-CM | POA: Diagnosis not present

## 2021-01-02 DIAGNOSIS — K319 Disease of stomach and duodenum, unspecified: Secondary | ICD-10-CM

## 2021-01-02 DIAGNOSIS — R197 Diarrhea, unspecified: Secondary | ICD-10-CM

## 2021-01-02 DIAGNOSIS — K648 Other hemorrhoids: Secondary | ICD-10-CM

## 2021-01-02 DIAGNOSIS — K2980 Duodenitis without bleeding: Secondary | ICD-10-CM

## 2021-01-02 DIAGNOSIS — D122 Benign neoplasm of ascending colon: Secondary | ICD-10-CM | POA: Diagnosis not present

## 2021-01-02 DIAGNOSIS — R634 Abnormal weight loss: Secondary | ICD-10-CM | POA: Diagnosis not present

## 2021-01-02 DIAGNOSIS — R1032 Left lower quadrant pain: Secondary | ICD-10-CM

## 2021-01-02 DIAGNOSIS — D124 Benign neoplasm of descending colon: Secondary | ICD-10-CM | POA: Diagnosis not present

## 2021-01-02 DIAGNOSIS — D123 Benign neoplasm of transverse colon: Secondary | ICD-10-CM

## 2021-01-02 DIAGNOSIS — K219 Gastro-esophageal reflux disease without esophagitis: Secondary | ICD-10-CM

## 2021-01-02 DIAGNOSIS — R110 Nausea: Secondary | ICD-10-CM

## 2021-01-02 MED ORDER — PANTOPRAZOLE SODIUM 40 MG PO TBEC: 40.0000 mg | DELAYED_RELEASE_TABLET | Freq: Every day | ORAL | 4 refills | Status: DC

## 2021-01-02 MED ORDER — SODIUM CHLORIDE 0.9 % IV SOLN: 500.0000 mL | Freq: Once | INTRAVENOUS | Status: DC

## 2021-01-02 NOTE — Progress Notes (Signed)
Pt in recovery with monitors in place, VSS. Report given to receiving RN. Bite guard was placed with pt awake to ensure comfort. No dental or soft tissue damage noted. RN will remove the guard when the pt is awake.  

## 2021-01-02 NOTE — Op Note (Signed)
[facility] Patient Name: Misty Evans Procedure Date: 01/02/2021 2:24 PM MRN: [id number] Endoscopist: Jackquline Denmark , MD Age: 70 Referring MD:  Date of Birth: 1950/08/11 Gender: Female Account #: [id number] Procedure:                Colonoscopy Indications:              Abdominal pain in the left upper quadrant, Weight                            loss with negative CT Abdo/pelvis. Medicines:                Monitored Anesthesia Care Procedure:                Pre-Anesthesia Assessment:                           - Prior to the procedure, a History and Physical                            was performed, and patient medications and                            allergies were reviewed. The patient's tolerance of                            previous anesthesia was also reviewed. The risks                            and benefits of the procedure and the sedation                            options and risks were discussed with the patient.                            All questions were answered, and informed consent                            was obtained. Prior Anticoagulants: The patient has                            taken no previous anticoagulant or antiplatelet                            agents. ASA Grade Assessment: III - A patient with                            severe systemic disease. After reviewing the risks                            and benefits, the patient was deemed in                            satisfactory condition to undergo the procedure.  After obtaining informed consent, the colonoscope                            was passed under direct vision. Throughout the                            procedure, the patient's blood pressure, pulse, and                            oxygen saturations were monitored continuously. The                            [id number]-[id number] Slim SB Colonoscope was introduced                            through the anus and  advanced to the the cecum,                            identified by appendiceal orifice and ileocecal                            valve. The colonoscopy was performed without                            difficulty. The patient tolerated the procedure                            well. The quality of the bowel preparation was                            fair. There was some retained stool in some areas                            of the colon making it somewhat harder to                            visualize. The colon was highly redundant as well.                            The ileocecal valve, appendiceal orifice, and                            rectum were photographed. The colon was highly                            redundant. Scope In: 2:41:12 PM Scope Out: 3:15:04 PM Scope Withdrawal Time: 0 hours 25 minutes 22 seconds  Total Procedure Duration: 0 hours 33 minutes 52 seconds  Findings:                 A 4 mm polyp was found in the cecum. The polyp was                            sessile. The polyp  was removed with a cold biopsy                            forceps. Resection and retrieval were complete.                           Three sessile polyps were found in the mid                            descending colon and proximal ascending colon. The                            polyps were 8 mm in size. These polyps were removed                            with a cold snare. Resection and retrieval were                            complete. Estimated blood loss: none.                           A 12 mm polyp was found in the hepatic flexure. The                            polyp was sessile. The polyp was removed with a                            piecemeal technique using a hot snare. Resection                            and retrieval were complete.                           Multiple small-mouthed diverticula were found in                            the sigmoid colon. Stool was impacted (fecolith) in                             the appendiceal orifice.                           Non-bleeding internal hemorrhoids were found during                            retroflexion. The hemorrhoids were small.                           The exam was otherwise without abnormality on                            direct and retroflexion views. Complications:            No immediate complications. Estimated Blood Loss:     Estimated blood loss:  none. Impression:               - One 4 mm polyp in the cecum, removed with a cold                            biopsy forceps. Resected and retrieved.                           - Three 8 mm polyps in the mid descending colon and                            in the proximal ascending colon, removed with a                            cold snare. Resected and retrieved.                           - One 12 mm polyp at the hepatic flexure, removed                            piecemeal using a hot snare. Resected and retrieved.                           - Diverticulosis in the sigmoid colon.                           - Non-bleeding internal hemorrhoids.                           - The examination was otherwise normal on direct                            and retroflexion views. Recommendation:           - Patient has a contact number available for                            emergencies. The signs and symptoms of potential                            delayed complications were discussed with the                            patient. Return to normal activities tomorrow.                            Written discharge instructions were provided to the                            patient.                           - Resume previous diet.                           - Continue  present medications.                           - Await pathology results.                           - Repeat colonoscopy in 1-3 years for surveillance                            based on pathology results.                            - Return to GI clinic in 12 weeks. Jackquline Denmark, MD 01/02/2021 3:27:35 PM This report has been signed electronically.

## 2021-01-02 NOTE — Progress Notes (Signed)
Called to room to assist during endoscopic procedure.  Patient ID and intended procedure confirmed with present staff. Received instructions for my participation in the procedure from the performing physician.  

## 2021-01-02 NOTE — Progress Notes (Signed)
VS- Misty Evans. °

## 2021-01-02 NOTE — Patient Instructions (Signed)
Handout given:  diverticulosis, polyps Resume previous diet Continue current medications increase pantoprazole to '40mg'$  daily Await pathology results Repeat colonoscopy in 1-3 years  YOU HAD AN ENDOSCOPIC PROCEDURE TODAY AT [facility]:   Refer to the procedure report that was given to you for any specific questions about what was found during the examination.  If the procedure report does not answer your questions, please call your gastroenterologist to clarify.  If you requested that your care partner not be given the details of your procedure findings, then the procedure report has been included in a sealed envelope for you to review at your convenience later.  YOU SHOULD EXPECT: Some feelings of bloating in the abdomen. Passage of more gas than usual.  Walking can help get rid of the air that was put into your GI tract during the procedure and reduce the bloating. If you had a lower endoscopy (such as a colonoscopy or flexible sigmoidoscopy) you may notice spotting of blood in your stool or on the toilet paper. If you underwent a bowel prep for your procedure, you may not have a normal bowel movement for a few days.  Please Note:  You might notice some irritation and congestion in your nose or some drainage.  This is from the oxygen used during your procedure.  There is no need for concern and it should clear up in a day or so.  SYMPTOMS TO REPORT IMMEDIATELY: Following lower endoscopy (colonoscopy or flexible sigmoidoscopy):  Excessive amounts of blood in the stool  Significant tenderness or worsening of abdominal pains  Swelling of the abdomen that is new, acute  Fever of 100F or higher  Following upper endoscopy (EGD)  Vomiting of blood or coffee ground material  New chest pain or pain under the shoulder blades  Painful or persistently difficult swallowing  New shortness of breath  Fever of 100F or higher  Black, tarry-looking stools  For urgent or emergent issues,  a gastroenterologist can be reached at any hour by calling [phone]. Do not use MyChart messaging for urgent concerns.   DIET:  We do recommend a small meal at first, but then you may proceed to your regular diet.  Drink plenty of fluids but you should avoid alcoholic beverages for 24 hours.  ACTIVITY:  You should plan to take it easy for the rest of today and you should NOT DRIVE or use heavy machinery until tomorrow (because of the sedation medicines used during the test).    FOLLOW UP: Our staff will call the number listed on your records 48-72 hours following your procedure to check on you and address any questions or concerns that you may have regarding the information given to you following your procedure. If we do not reach you, we will leave a message.  We will attempt to reach you two times.  During this call, we will ask if you have developed any symptoms of COVID 19. If you develop any symptoms (ie: fever, flu-like symptoms, shortness of breath, cough etc.) before then, please call [phone].  If you test positive for Covid 19 in the 2 weeks post procedure, please call and report this information to [place].    If any biopsies were taken you will be contacted by phone or by letter within the next 1-3 weeks.  Please call us at [phone] if you have not heard about the biopsies in 3 weeks.   SIGNATURES/CONFIDENTIALITY: You and/or your care partner have signed paperwork which will  be entered into your electronic medical record.  These signatures attest to the fact that that the information above on your After Visit Summary has been reviewed and is understood.  Full responsibility of the confidentiality of this discharge information lies with you and/or your care-partner.

## 2021-01-02 NOTE — Op Note (Signed)
[facility] Patient Name: Misty Evans Procedure Date: 01/02/2021 2:24 PM MRN: [id number] Endoscopist: Jackquline Denmark , MD Age: 70 Referring MD:  Date of Birth: 08/24/1950 Gender: Female Account #: [id number] Procedure:                Upper GI endoscopy Indications:              Abdominal pain in the left upper quadrant, weight                            loss with negative CT Abdo/pelvis. Medicines:                Monitored Anesthesia Care Procedure:                Pre-Anesthesia Assessment:                           - Prior to the procedure, a History and Physical                            was performed, and patient medications and                            allergies were reviewed. The patient's tolerance of                            previous anesthesia was also reviewed. The risks                            and benefits of the procedure and the sedation                            options and risks were discussed with the patient.                            All questions were answered, and informed consent                            was obtained. Prior Anticoagulants: The patient has                            taken no previous anticoagulant or antiplatelet                            agents. ASA Grade Assessment: III - A patient with                            severe systemic disease. After reviewing the risks                            and benefits, the patient was deemed in                            satisfactory condition to undergo the procedure.  After obtaining informed consent, the endoscope was                            passed under direct vision. Throughout the                            procedure, the patient's blood pressure, pulse, and                            oxygen saturations were monitored continuously. The                            GIF [id number] was introduced through the                            mouth, and advanced to  the second part of duodenum.                            The upper GI endoscopy was accomplished without                            difficulty. The patient tolerated the procedure                            well. Scope In: Scope Out: Findings:                 The Z-line was irregular and was found 40 cm from                            the incisors. Biopsies were taken with a cold                            forceps for histology.                           Diffuse mild inflammation characterized by erythema                            was found in the gastric antrum. Biopsies were                            taken with a cold forceps for histology.                           The examined duodenum was normal. Biopsies for                            histology were taken with a cold forceps for                            evaluation of celiac disease. Complications:            No immediate complications. Estimated Blood Loss:     Estimated blood loss: none. Impression:               -  Z-line irregular, 40 cm from the incisors.                            Biopsied.                           - Gastritis. Biopsied.                           - Normal examined duodenum. Biopsied. Recommendation:           - Patient has a contact number available for                            emergencies. The signs and symptoms of potential                            delayed complications were discussed with the                            patient. Return to normal activities tomorrow.                            Written discharge instructions were provided to the                            patient.                           - Resume previous diet.                           - Continue present medications.                           - Await pathology results.                           - The findings and recommendations were discussed                            with the patient's family. Jackquline Denmark, MD 01/02/2021 3:18:25  PM This report has been signed electronically.

## 2021-01-04 ENCOUNTER — Telehealth: Payer: Self-pay | Admitting: *Deleted

## 2021-01-04 ENCOUNTER — Telehealth: Payer: Self-pay

## 2021-01-04 NOTE — Telephone Encounter (Signed)
  Follow up Call-  Call back number 01/02/2021  Post procedure Call Back phone  # [phone]  Permission to leave phone message Yes  Some recent data might be hidden     Patient questions:  Do you have a fever, pain , or abdominal swelling? No. Pain Score  0 *  Have you tolerated food without any problems? Yes.    Have you been able to return to your normal activities? Yes.    Do you have any questions about your discharge instructions: Diet   No. Medications  No. Follow up visit  No.  Do you have questions or concerns about your Care? No.  Actions: * If pain score is 4 or above: No action needed, pain <4.  Marland KitchenHave you developed a fever since your procedure? no  2.   Have you had an respiratory symptoms (SOB or cough) since your procedure? no  3.   Have you tested positive for COVID 19 since your procedure no  4.   Have you had any family members/close contacts diagnosed with the COVID 19 since your procedure?  no   If yes to any of these questions please route to Joylene John, RN and Joella Prince, RN

## 2021-01-04 NOTE — Telephone Encounter (Signed)
Attempted to reach pt. With follow-up call following endoscopic procedure. LM on pt. Voice mail.  Will try to reach pt. Again later today.

## 2021-01-07 ENCOUNTER — Telehealth: Payer: Self-pay

## 2021-01-07 NOTE — Chronic Care Management (AMB) (Signed)
Chronic Care Management Pharmacy Assistant   Name: Misty Evans  MRN: [id number] DOB: Oct 20, 1950  Reason for Encounter: Diabetes Mellitus Disease State Call  Recent office visits:  No visits noted  Recent consult visits:  01/02/21- Colonoscopy Procedure- Gastroenterology  Hospital visits:  None in previous 6 months  Medications: Outpatient Encounter Medications as of 01/07/2021  Medication Sig   albuterol (ACCUNEB) 1.25 MG/3ML nebulizer solution Take 1 ampule by nebulization every 6 (six) hours as needed for wheezing.   albuterol (VENTOLIN HFA) 108 (90 Base) MCG/ACT inhaler USE 2 INHALATIONS BY MOUTH  EVERY 6 HOURS AS NEEDED FOR WHEEZING OR SHORTNESS OF  BREATH   aspirin 81 MG chewable tablet Chew 81 mg by mouth daily.   Biotin 10000 MCG TABS Take by mouth daily.    Budeson-Glycopyrrol-Formoterol (BREZTRI AEROSPHERE) 160-9-4.8 MCG/ACT AERO Inhale 1 application into the lungs in the morning and at bedtime.   FLUoxetine (PROZAC) 10 MG tablet Take 1 tablet by mouth once daily   hydrALAZINE (APRESOLINE) 100 MG tablet TAKE 1 TABLET BY MOUTH  TWICE DAILY WITH FOOD   ibuprofen (ADVIL) 200 MG tablet Take 800 mg by mouth every 6 (six) hours as needed. Takes 4 tablets as needed for back pain   irbesartan (AVAPRO) 300 MG tablet TAKE 1 TABLET BY MOUTH ONCE DAILY   pantoprazole (PROTONIX) 40 MG tablet Take 1 tablet (40 mg total) by mouth daily.   pravastatin (PRAVACHOL) 10 MG tablet TAKE 1 TABLET BY MOUTH ONCE DAILY   propranolol ER (INDERAL LA) 120 MG 24 hr capsule TAKE 1 CAPSULE BY MOUTH  DAILY   vitamin B-12 (CYANOCOBALAMIN) 1000 MCG tablet Take 1,000 mcg by mouth daily.   No facility-administered encounter medications on file as of 01/07/2021.     Recent Office Vitals: BP Readings from Last 3 Encounters:  01/02/21 140/78  10/24/20 118/74  10/23/20 (!) 142/86   Pulse Readings from Last 3 Encounters:  01/02/21 61  10/24/20 76  10/23/20 64    Wt Readings from Last 3  Encounters:  01/02/21 87 lb (39.5 kg)  10/24/20 87 lb 9.6 oz (39.7 kg)  09/24/20 84 lb 12.8 oz (38.5 kg)     Kidney Function Lab Results  Component Value Date/Time   CREATININE 0.92 10/23/2020 02:18 PM   CREATININE 0.71 08/17/2020 11:50 AM   GFRNONAA 73 02/09/2020 11:30 AM   GFRAA 84 02/09/2020 11:30 AM    BMP Latest Ref Rng & Units 10/23/2020 08/17/2020 02/09/2020  Glucose 65 - 99 mg/dL 83 81 84  BUN 8 - 27 mg/dL '17 14 19  '$ Creatinine 0.57 - 1.00 mg/dL 0.92 0.71 0.82  BUN/Creat Ratio 12 - '28 18 20 23  '$ Sodium 134 - 144 mmol/L 141 143 140  Potassium 3.5 - 5.2 mmol/L 4.3 4.3 4.1  Chloride 96 - 106 mmol/L 102 100 101  CO2 20 - 29 mmol/L '23 26 26  '$ Calcium 8.7 - 10.3 mg/dL 9.8 9.9 9.7   Several unsuccessful attempts made to contact patient  Current antihypertensive regimen:  Hydralazine 100 mg twice daily with food Irbesartan 300 mg daily Propanolol ER 120 mg daily  Patient verbally confirms she is taking the above medications as directed.   How often are you checking your Blood Pressure?    Current home BP readings:   DATE:             BP               PULSE  Wrist or arm cuff: Caffeine intake: Salt intake: OTC medications including pseudoephedrine or NSAIDs?    What recent interventions/DTPs have been made by any provider to improve Blood Pressure control since last CPP Visit:   Any recent hospitalizations or ED visits since last visit with CPP?   What diet changes have been made to improve Blood Pressure Control?    What exercise is being done to improve your Blood Pressure Control?    Adherence Review: Is the patient currently on ACE/ARB medication? Yes Does the patient have >5 day gap between last estimated fill dates? CPP to review Hydralazine 100 mg - 90 DS last filled 07/25/20 Propanolol ER 120 mg - 90 DS last filled 07/12/20  Care Gaps: Last annual wellness visit?  Star Rating Drugs:  Medication:  Last Fill: Day Supply Irbesartan 300  mg 09/07/20 90 DS Pravastatin 10 mg 08/23/20 90 DS  [facility] CPA, CMA

## 2021-01-08 ENCOUNTER — Encounter: Payer: Self-pay | Admitting: Gastroenterology

## 2021-01-10 ENCOUNTER — Other Ambulatory Visit: Payer: Self-pay | Admitting: Family Medicine

## 2021-01-15 ENCOUNTER — Other Ambulatory Visit (HOSPITAL_COMMUNITY): Payer: Medicare Other

## 2021-01-16 ENCOUNTER — Other Ambulatory Visit (HOSPITAL_COMMUNITY): Payer: Medicare Other

## 2021-02-07 ENCOUNTER — Ambulatory Visit (INDEPENDENT_AMBULATORY_CARE_PROVIDER_SITE_OTHER): Payer: Medicare Other

## 2021-02-07 DIAGNOSIS — F17218 Nicotine dependence, cigarettes, with other nicotine-induced disorders: Secondary | ICD-10-CM

## 2021-02-07 DIAGNOSIS — Z0000 Encounter for general adult medical examination without abnormal findings: Secondary | ICD-10-CM | POA: Diagnosis not present

## 2021-02-07 NOTE — Progress Notes (Addendum)
Subjective:   Misty Evans is a 70 y.o. female who presents for Medicare Annual (Subsequent) preventive examination.  I connected with  Misty Evans on 02/07/21 by a audio enabled telemedicine application and verified that I am speaking with the correct person using two identifiers.   Participants in the call include Misty Evans (patient), and Misty Hammock, LPN.  Location patient: home Location provider: office    I discussed the limitations of evaluation and management by telemedicine. The patient expressed understanding and agreed to proceed.   Review of Systems    ROS includes decrease of appetite and unintentional weight-loss  Cardiac Risk Factors include: advanced age (>41mn, >>71women);smoking/ tobacco exposure     Objective:    There were no vitals filed for this visit. There is no recent height or weight on file to calculate BMI.  Advanced Directives 02/07/2021  Does Patient Have a Medical Advance Directive? No  Would patient like information on creating a medical advance directive? Yes (MAU/Ambulatory/Procedural Areas - Information given)    Current Medications (verified) Outpatient Encounter Medications as of 02/07/2021  Medication Sig   albuterol (ACCUNEB) 1.25 MG/3ML nebulizer solution Take 1 ampule by nebulization every 6 (six) hours as needed for wheezing.   albuterol (VENTOLIN HFA) 108 (90 Base) MCG/ACT inhaler USE 2 INHALATIONS BY MOUTH  EVERY 6 HOURS AS NEEDED FOR WHEEZING OR SHORTNESS OF  BREATH   aspirin 81 MG chewable tablet Chew 81 mg by mouth daily.   Biotin 10000 MCG TABS Take by mouth daily.    Budeson-Glycopyrrol-Formoterol (BREZTRI AEROSPHERE) 160-9-4.8 MCG/ACT AERO Inhale 1 application into the lungs in the morning and at bedtime.   FLUoxetine (PROZAC) 10 MG tablet Take 1 tablet by mouth once daily   hydrALAZINE (APRESOLINE) 100 MG tablet TAKE 1 TABLET BY MOUTH  TWICE DAILY WITH FOOD   ibuprofen (ADVIL) 200 MG tablet Take  800 mg by mouth every 6 (six) hours as needed. Takes 4 tablets as needed for back pain   irbesartan (AVAPRO) 300 MG tablet TAKE 1 TABLET BY MOUTH ONCE DAILY   pantoprazole (PROTONIX) 40 MG tablet Take 1 tablet (40 mg total) by mouth daily.   pravastatin (PRAVACHOL) 10 MG tablet TAKE 1 TABLET BY MOUTH ONCE DAILY   propranolol ER (INDERAL LA) 120 MG 24 hr capsule TAKE 1 CAPSULE BY MOUTH  DAILY   vitamin B-12 (CYANOCOBALAMIN) 1000 MCG tablet Take 1,000 mcg by mouth daily.   No facility-administered encounter medications on file as of 02/07/2021.    Allergies (verified) Penicillin g and Sulfamethoxazole   History: Past Medical History:  Diagnosis Date   COPD (chronic obstructive pulmonary disease) (HCC)    Depression    GERD (gastroesophageal reflux disease)    Hypertension    Insomnia    Osteoporosis    Renal stones 05/2015   Past Surgical History:  Procedure Laterality Date   ABDOMINAL HYSTERECTOMY     APPENDECTOMY     BACK SURGERY     x2   CHOLECYSTECTOMY     COLONOSCOPY     Family History  Problem Relation Age of Onset   Hypertension Mother    Cancer Father        esophageal   Hypertension Father    Hypertension Maternal Grandmother    Heart attack Maternal Grandmother    Diabetes Paternal Grandmother    Hypertension Paternal Grandmother    Colon cancer Neg Hx    Pancreatic cancer Neg Hx    Esophageal cancer Neg  Hx    Rectal cancer Neg Hx    Stomach cancer Neg Hx    Social History   Socioeconomic History   Marital status: Married    Spouse name: Misty Evans   Number of children: 2  Occupational History   Occupation: Retired  Tobacco Use   Smoking status: Every Day    Packs/day: 0.25    Years: 54.00    Pack years: 13.50    Types: Cigarettes   Smokeless tobacco: Never   Tobacco comments:    Has smoked since 70 years old  Vaping Use   Vaping Use: Never used  Substance and Sexual Activity   Alcohol use: Not Currently   Drug use: Never   Sexual activity:  Not on file   Social Determinants of Health   Financial Resource Strain: Not on file  Food Insecurity: Not on file  Transportation Needs: Not on file  Physical Activity: Not on file  Stress: Not on file  Social Connections: Not on file    Tobacco Counseling Ready to quit: No Counseling given: education mailed to patient Tobacco comments: Has smoked since 70 years old   Clinical Intake:  Pre-visit preparation completed: Yes Pain : No/denies pain   Nutritional Risks: Unintentional weight loss, Other (Comment) (decreased appetite) Diabetes: No How often do you need to have someone help you when you read instructions, pamphlets, or other written materials from your doctor or pharmacy?: 3 - Sometimes Interpreter Needed?: No   Activities of Daily Living In your present state of health, do you have any difficulty performing the following activities: 02/07/2021  Hearing? N  Vision? N  Difficulty concentrating or making decisions? N  Walking or climbing stairs? N  Dressing or bathing? N  Doing errands, shopping? N  Preparing Food and eating ? N  Using the Toilet? N  In the past six months, have you accidently leaked urine? Y  Do you have problems with loss of bowel control? N  Managing your Medications? N  Managing your Finances? N  Housekeeping or managing your Housekeeping? N  Some recent data might be hidden    Patient Care Team: Rochel Brome, MD as PCP - General (Family Medicine) Burnice Logan, [facility] as Pharmacist (Pharmacist) Jackquline Denmark, MD as Consulting Physician (Gastroenterology) Laurin Coder, MD as Consulting Physician (Pulmonary Disease)    Assessment:   This is a routine wellness examination for Misty Evans.  Dietary issues and exercise activities discussed: Current Exercise Habits: The patient does not participate in regular exercise at present, Exercise limited by: respiratory conditions(s)  Depression Screen PHQ 2/9 Scores 02/07/2021 09/24/2020 09/24/2020  08/17/2020 02/09/2020  PHQ - 2 Score 1 1 0 0 6  PHQ- 9 Score - 9 - 7 6    Fall Risk Fall Risk  02/07/2021 09/26/2020 09/24/2020 11/03/2019  Falls in the past year? 0 - 0 0  Number falls in past yr: 0 - 0 0  Injury with Fall? 0 - 0 0  Risk for fall due to : No Fall Risks No Fall Risks - -  Follow up Falls evaluation completed;Education provided Falls evaluation completed - -    FALL RISK PREVENTION PERTAINING TO THE HOME:  Any stairs in or around the home? No  If so, are there any without handrails? No  Home free of loose throw rugs in walkways, pet beds, electrical cords, etc? Yes  Adequate lighting in your home to reduce risk of falls? Yes   ASSISTIVE DEVICES UTILIZED TO PREVENT FALLS:  Life alert? No  Use of a cane, walker or w/c? No  Grab bars in the bathroom? No  Shower chair or bench in shower? No  Elevated toilet seat or a handicapped toilet? No   Cognitive Function:     6CIT Screen 02/07/2021  What Year? 0 points  What month? 0 points  What time? 0 points  Count back from 20 2 points  Months in reverse 4 points  Repeat phrase 2 points  Total Score 8    Immunizations Immunization History  Administered Date(s) Administered   Fluad Quad(high Dose 65+) 02/09/2020   Influenza-Unspecified 02/07/2016, 02/17/2017, 02/08/2018, 01/28/2019   Pneumococcal Conjugate-13 02/07/2016   Pneumococcal Polysaccharide-23 03/10/2014, 02/09/2020    TDAP status: Due, Education has been provided regarding the importance of this vaccine. Advised may receive this vaccine at local pharmacy or Health Dept. Aware to provide a copy of the vaccination record if obtained from local pharmacy or Health Dept. Verbalized acceptance and understanding.  Flu Vaccine status: Due, Education has been provided regarding the importance of this vaccine. Advised may receive this vaccine at local pharmacy or Health Dept. Aware to provide a copy of the vaccination record if obtained from local pharmacy or Health Dept.  Verbalized acceptance and understanding.  Pneumococcal vaccine status: Up to date  Covid-19 vaccine status: Declined, Education has been provided regarding the importance of this vaccine but patient still declined. Advised may receive this vaccine at local pharmacy or Health Dept.or vaccine clinic. Aware to provide a copy of the vaccination record if obtained from local pharmacy or Health Dept. Verbalized acceptance and understanding.  Qualifies for Shingles Vaccine? Yes   Zostavax completed No   Shingrix Completed?: No.    Education has been provided regarding the importance of this vaccine. Patient has been advised to call insurance company to determine out of pocket expense if they have not yet received this vaccine. Advised may also receive vaccine at local pharmacy or Health Dept. Verbalized acceptance and understanding.  Screening Tests Health Maintenance  Topic Date Due   Hepatitis C Screening  Never done   TETANUS/TDAP  Never done   Zoster Vaccines- Shingrix (1 of 2) Never done   INFLUENZA VACCINE  12/31/2020   MAMMOGRAM  09/25/2022   COLONOSCOPY (Pts 45-59yr Insurance coverage will need to be confirmed)  01/03/2024   DEXA SCAN  Completed   PNA vac Low Risk Adult  Completed   HPV VACCINES  Aged Out   COVID-19 Vaccine  Discontinued    Health Maintenance  Health Maintenance Due  Topic Date Due   Hepatitis C Screening  Never done   TETANUS/TDAP  Never done   Zoster Vaccines- Shingrix (1 of 2) Never done   INFLUENZA VACCINE  12/31/2020    Colorectal cancer screening: Type of screening: Colonoscopy. Completed 01/02/21. Repeat every 3 years  Mammogram status: Completed 08/2020. Repeat every year  Bone Density status: Completed 4/22.  Lung Cancer Screening: (Low Dose CT Chest recommended if Age 71-80years, 30 pack-year currently smoking OR have quit w/in 15years.) does qualify.   Lung Cancer Screening Referral: Patient sees pulmonology  Additional Screening:  Vision  Screening: Recommended annual ophthalmology exams for early detection of glaucoma and other disorders of the eye. Is the patient up to date with their annual eye exam?  Yes  Who is the provider or what is the name of the office in which the patient attends annual eye exams? [facility]  Dental Screening: Recommended annual dental exams for proper  oral hygiene     Plan:    1- Vaccine Recommendations - Flu vaccine, COVID Vaccine (declined), Tetanus Vaccine, Shingrix Vaccine 2- Advance Directive - see information mailed 3- Decreased appetite/Weight loss - continue supplemental drinks for added protein and calories - follow-up with Dr Tobie Poet.   4- See enclosed information mailed regarding smoking cessation   I have personally reviewed and noted the following in the patient's chart:   Medical and social history Use of alcohol, tobacco or illicit drugs  Current medications and supplements including opioid prescriptions.  Functional ability and status Nutritional status Physical activity Advanced directives List of other physicians Hospitalizations, surgeries, and ER visits in previous 12 months Screenings to include cognitive, depression, and falls Referrals and appointments  In addition, I have reviewed and discussed with patient certain preventive protocols, quality metrics, and best practice recommendations. A written personalized care plan for preventive services as well as general preventive health recommendations were provided to patient.     Erie Noe, LPN   [id number]    I attest that the above note was reviewed and is accurate. I agree with plan.  Dr. Tobie Poet

## 2021-02-07 NOTE — Patient Instructions (Signed)
Health Maintenance, Female Adopting a healthy lifestyle and getting preventive care are important in promoting health and wellness. Ask your health care provider about: The right schedule for you to have regular tests and exams. Things you can do on your own to prevent diseases and keep yourself healthy. What should I know about diet, weight, and exercise? Eat a healthy diet  Eat a diet that includes plenty of vegetables, fruits, low-fat dairy products, and lean protein. Do not eat a lot of foods that are high in solid fats, added sugars, or sodium. Maintain a healthy weight Body mass index (BMI) is used to identify weight problems. It estimates body fat based on height and weight. Your health care provider can help determine your BMI and help you achieve or maintain a healthy weight. Get regular exercise Get regular exercise. This is one of the most important things you can do for your health. Most adults should: Exercise for at least 150 minutes each week. The exercise should increase your heart rate and make you sweat (moderate-intensity exercise). Do strengthening exercises at least twice a week. This is in addition to the moderate-intensity exercise. Spend less time sitting. Even light physical activity can be beneficial. Watch cholesterol and blood lipids Have your blood tested for lipids and cholesterol at 71 years of age, then have this test every 5 years. Have your cholesterol levels checked more often if: Your lipid or cholesterol levels are high. You are older than 71 years of age. You are at high risk for heart disease. What should I know about cancer screening? Depending on your health history and family history, you may need to have cancer screening at various ages. This may include screening for: Breast cancer. Cervical cancer. Colorectal cancer. Skin cancer. Lung cancer. What should I know about heart disease, diabetes, and high blood pressure? Blood pressure and heart  disease High blood pressure causes heart disease and increases the risk of stroke. This is more likely to develop in people who have high blood pressure readings, are of African descent, or are overweight. Have your blood pressure checked: Every 3-5 years if you are 18-39 years of age. Every year if you are 40 years old or older. Diabetes Have regular diabetes screenings. This checks your fasting blood sugar level. Have the screening done: Once every three years after age 40 if you are at a normal weight and have a low risk for diabetes. More often and at a younger age if you are overweight or have a high risk for diabetes. What should I know about preventing infection? Hepatitis B If you have a higher risk for hepatitis B, you should be screened for this virus. Talk with your health care provider to find out if you are at risk for hepatitis B infection. Hepatitis C Testing is recommended for: Everyone born from 1945 through 1965. Anyone with known risk factors for hepatitis C. Sexually transmitted infections (STIs) Get screened for STIs, including gonorrhea and chlamydia, if: You are sexually active and are younger than 71 years of age. You are older than 71 years of age and your health care provider tells you that you are at risk for this type of infection. Your sexual activity has changed since you were last screened, and you are at increased risk for chlamydia or gonorrhea. Ask your health care provider if you are at risk. Ask your health care provider about whether you are at high risk for HIV. Your health care provider may recommend a prescription medicine   to help prevent HIV infection. If you choose to take medicine to prevent HIV, you should first get tested for HIV. You should then be tested every 3 months for as long as you are taking the medicine. Pregnancy If you are about to stop having your period (premenopausal) and you may become pregnant, seek counseling before you get  pregnant. Take 400 to 800 micrograms (mcg) of folic acid every day if you become pregnant. Ask for birth control (contraception) if you want to prevent pregnancy. Osteoporosis and menopause Osteoporosis is a disease in which the bones lose minerals and strength with aging. This can result in bone fractures. If you are 65 years old or older, or if you are at risk for osteoporosis and fractures, ask your health care provider if you should: Be screened for bone loss. Take a calcium or vitamin D supplement to lower your risk of fractures. Be given hormone replacement therapy (HRT) to treat symptoms of menopause. Follow these instructions at home: Lifestyle Do not use any products that contain nicotine or tobacco, such as cigarettes, e-cigarettes, and chewing tobacco. If you need help quitting, ask your health care provider. Do not use street drugs. Do not share needles. Ask your health care provider for help if you need support or information about quitting drugs. Alcohol use Do not drink alcohol if: Your health care provider tells you not to drink. You are pregnant, may be pregnant, or are planning to become pregnant. If you drink alcohol: Limit how much you use to 0-1 drink a day. Limit intake if you are breastfeeding. Be aware of how much alcohol is in your drink. In the [place] one drink equals one 12 oz bottle of beer (355 mL), one 5 oz glass of wine (148 mL), or one 1 oz glass of hard liquor (44 mL). General instructions Schedule regular health, dental, and eye exams. Stay current with your vaccines. Tell your health care provider if: You often feel depressed. You have ever been abused or do not feel safe at home. Summary Adopting a healthy lifestyle and getting preventive care are important in promoting health and wellness. Follow your health care provider's instructions about healthy diet, exercising, and getting tested or screened for diseases. Follow your health care provider's  instructions on monitoring your cholesterol and blood pressure. This information is not intended to replace advice given to you by your health care provider. Make sure you discuss any questions you have with your health care provider. Document Revised: 07/27/2020 Document Reviewed: 05/12/2018 Elsevier Patient Education  2022 Elsevier Inc[employer]  

## 2021-02-28 ENCOUNTER — Other Ambulatory Visit: Payer: Self-pay | Admitting: Family Medicine

## 2021-02-28 ENCOUNTER — Telehealth: Payer: Self-pay

## 2021-02-28 MED ORDER — ALBUTEROL SULFATE HFA 108 (90 BASE) MCG/ACT IN AERS: INHALATION_SPRAY | RESPIRATORY_TRACT | 3 refills | Status: DC

## 2021-02-28 NOTE — Chronic Care Management (AMB) (Signed)
    Chronic Care Management Pharmacy Assistant   Name: Glenette Bookwalter  MRN: [id number] DOB: 1951-05-02   Reason for Encounter: Disease State call for COPD    Recent office visits:  02/07/21 Rhae Hammock LPN. Seen for AWV. No med changes. Follow up in 1 year.  Recent consult visits:  None since 01/07/21  Hospital visits:  None in previous 6 months  Medications: Outpatient Encounter Medications as of 02/28/2021  Medication Sig   albuterol (ACCUNEB) 1.25 MG/3ML nebulizer solution Take 1 ampule by nebulization every 6 (six) hours as needed for wheezing.   albuterol (VENTOLIN HFA) 108 (90 Base) MCG/ACT inhaler USE 2 INHALATIONS BY MOUTH  EVERY 6 HOURS AS NEEDED FOR WHEEZING OR SHORTNESS OF  BREATH   aspirin 81 MG chewable tablet Chew 81 mg by mouth daily.   Biotin 10000 MCG TABS Take by mouth daily.    Budeson-Glycopyrrol-Formoterol (BREZTRI AEROSPHERE) 160-9-4.8 MCG/ACT AERO Inhale 1 application into the lungs in the morning and at bedtime.   FLUoxetine (PROZAC) 10 MG tablet Take 1 tablet by mouth once daily   hydrALAZINE (APRESOLINE) 100 MG tablet TAKE 1 TABLET BY MOUTH  TWICE DAILY WITH FOOD   ibuprofen (ADVIL) 200 MG tablet Take 800 mg by mouth every 6 (six) hours as needed. Takes 4 tablets as needed for back pain   irbesartan (AVAPRO) 300 MG tablet TAKE 1 TABLET BY MOUTH ONCE DAILY   pantoprazole (PROTONIX) 40 MG tablet Take 1 tablet (40 mg total) by mouth daily.   pravastatin (PRAVACHOL) 10 MG tablet TAKE 1 TABLET BY MOUTH ONCE DAILY   propranolol ER (INDERAL LA) 120 MG 24 hr capsule TAKE 1 CAPSULE BY MOUTH  DAILY   vitamin B-12 (CYANOCOBALAMIN) 1000 MCG tablet Take 1,000 mcg by mouth daily.   No facility-administered encounter medications on file as of 02/28/2021.    Current COPD regimen:   Albuterol Inhaler 2 puffs as needed every 6 hours  Albuterol Nebulizer uses every 6 hours as needed  Breztri Inhaler 1 puff in the morning and at bedtime (Patient  Assistance)  Any recent hospitalizations or ED visits since last visit with CPP? No recent ER visits   reports the following COPD symptoms, including pt gets short of breath when she is doing stuff and has used her rescue inhaler about twice a week.   What recent interventions/DTPs have been made by any provider to improve breathing since last visit: Pt states no changes    Have you had exacerbation/flare-up since last visit? No  What do you do when you are short of breath?  Rescue medication and pt rest.  Current tobacco use? Pt is smoking but has cut back and doesn't even smoke a pack a day  Interested in cessation? Yes, pt stated she is trying to quit   Respiratory Devices/Equipment Do you have a nebulizer? Yes Do you use a Peak Flow Meter? No Do you use a maintenance inhaler? Yes How often do you forget to use your daily inhaler? Forget sometimes at night Do you use a rescue inhaler? Yes How often do you use your rescue inhaler?  1-2x per week Do you use a spacer with your inhaler? No  Adherence Review: Does the patient have >5 day gap between last estimated fill date for maintenance inhaler medications? No, requested refill on pt Albuterol Inhaler   Care Gaps: Last annual wellness visit? Done on 02/07/21  [place]

## 2021-03-04 ENCOUNTER — Other Ambulatory Visit: Payer: Self-pay

## 2021-03-04 MED ORDER — ALBUTEROL SULFATE HFA 108 (90 BASE) MCG/ACT IN AERS: 2.0000 | INHALATION_SPRAY | Freq: Four times a day (QID) | RESPIRATORY_TRACT | 0 refills | Status: DC | PRN

## 2021-03-19 ENCOUNTER — Other Ambulatory Visit: Payer: Self-pay

## 2021-03-19 ENCOUNTER — Encounter: Payer: Self-pay | Admitting: Family Medicine

## 2021-03-19 ENCOUNTER — Ambulatory Visit (INDEPENDENT_AMBULATORY_CARE_PROVIDER_SITE_OTHER): Payer: Medicare Other

## 2021-03-19 DIAGNOSIS — Z23 Encounter for immunization: Secondary | ICD-10-CM | POA: Diagnosis not present

## 2021-04-08 ENCOUNTER — Telehealth: Payer: Self-pay

## 2021-04-08 NOTE — Chronic Care Management (AMB) (Signed)
Chronic Care Management Pharmacy Assistant   Name: Misty Evans  MRN: [id number] DOB: 1950-06-13   Reason for Encounter: Disease State call for BP   Recent office visits:  None   Recent consult visits:  None   Hospital visits:  None   Medications: Outpatient Encounter Medications as of 04/08/2021  Medication Sig   albuterol (ACCUNEB) 1.25 MG/3ML nebulizer solution Take 1 ampule by nebulization every 6 (six) hours as needed for wheezing.   albuterol (PROAIR HFA) 108 (90 Base) MCG/ACT inhaler Inhale 2 puffs into the lungs every 6 (six) hours as needed for wheezing or shortness of breath.   aspirin 81 MG chewable tablet Chew 81 mg by mouth daily.   Biotin 10000 MCG TABS Take by mouth daily.    Budeson-Glycopyrrol-Formoterol (BREZTRI AEROSPHERE) 160-9-4.8 MCG/ACT AERO Inhale 1 application into the lungs in the morning and at bedtime.   FLUoxetine (PROZAC) 10 MG tablet Take 1 tablet by mouth once daily   hydrALAZINE (APRESOLINE) 100 MG tablet TAKE 1 TABLET BY MOUTH  TWICE DAILY WITH FOOD   ibuprofen (ADVIL) 200 MG tablet Take 800 mg by mouth every 6 (six) hours as needed. Takes 4 tablets as needed for back pain   irbesartan (AVAPRO) 300 MG tablet TAKE 1 TABLET BY MOUTH ONCE DAILY   pantoprazole (PROTONIX) 40 MG tablet Take 1 tablet (40 mg total) by mouth daily.   pravastatin (PRAVACHOL) 10 MG tablet TAKE 1 TABLET BY MOUTH ONCE DAILY   propranolol ER (INDERAL LA) 120 MG 24 hr capsule TAKE 1 CAPSULE BY MOUTH  DAILY   vitamin B-12 (CYANOCOBALAMIN) 1000 MCG tablet Take 1,000 mcg by mouth daily.   No facility-administered encounter medications on file as of 04/08/2021.    Recent Relevant Labs: No results found for: HGBA1C, MICROALBUR  Kidney Function Lab Results  Component Value Date/Time   CREATININE 0.92 10/23/2020 02:18 PM   CREATININE 0.71 08/17/2020 11:50 AM   GFRNONAA 73 02/09/2020 11:30 AM   GFRAA 84 02/09/2020 11:30 AM    Recent Office Vitals: BP Readings  from Last 3 Encounters:  01/02/21 140/78  10/24/20 118/74  10/23/20 (!) 142/86   Pulse Readings from Last 3 Encounters:  01/02/21 61  10/24/20 76  10/23/20 64    Wt Readings from Last 3 Encounters:  01/02/21 87 lb (39.5 kg)  10/24/20 87 lb 9.6 oz (39.7 kg)  09/24/20 84 lb 12.8 oz (38.5 kg)     Kidney Function Lab Results  Component Value Date/Time   CREATININE 0.92 10/23/2020 02:18 PM   CREATININE 0.71 08/17/2020 11:50 AM   GFRNONAA 73 02/09/2020 11:30 AM   GFRAA 84 02/09/2020 11:30 AM    BMP Latest Ref Rng & Units 10/23/2020 08/17/2020 02/09/2020  Glucose 65 - 99 mg/dL 83 81 84  BUN 8 - 27 mg/dL 17 14 19   Creatinine 0.57 - 1.00 mg/dL 0.92 0.71 0.82  BUN/Creat Ratio 12 - 28 18 20 23   Sodium 134 - 144 mmol/L 141 143 140  Potassium 3.5 - 5.2 mmol/L 4.3 4.3 4.1  Chloride 96 - 106 mmol/L 102 100 101  CO2 20 - 29 mmol/L 23 26 26   Calcium 8.7 - 10.3 mg/dL 9.8 9.9 9.7     Current antihypertensive regimen:  Hydralazine 100 mg twice daily with food Irbesartan 300 mg daily Propanolol ER 120 mg daily  Patient verbally confirms she is taking the above medications as directed.   How often are you checking your Blood Pressure?   she checks  her blood pressure her medication.  Current home BP readings:   DATE:             BP               PULSE     Wrist or arm cuff: Caffeine intake: Salt intake: OTC medications including pseudoephedrine or NSAIDs?  Any readings above 180/120?  If yes any symptoms of hypertensive emergency?    What recent interventions/DTPs have been made by any provider to improve Blood Pressure control since last CPP Visit:   Any recent hospitalizations or ED visits since last visit with CPP?   What diet changes have been made to improve Blood Pressure Control?    What exercise is being done to improve your Blood Pressure Control?    Adherence Review: Is the patient currently on ACE/ARB medication? Yes Does the patient have >5 day gap  between last estimated fill dates? CPP to review  Care Gaps: Last annual wellness visit?  Star Rating Drugs:  Medication:  Last Fill: Day Supply  Irbesartan   Elray Mcgregor, [place] Pharmacist Assistant  [phone]   I have made 3 attempts and have been unsuccessful at completing this call

## 2021-04-14 ENCOUNTER — Other Ambulatory Visit: Payer: Self-pay | Admitting: Family Medicine

## 2021-04-14 DIAGNOSIS — F330 Major depressive disorder, recurrent, mild: Secondary | ICD-10-CM

## 2021-04-17 DIAGNOSIS — R55 Syncope and collapse: Secondary | ICD-10-CM | POA: Diagnosis not present

## 2021-04-17 DIAGNOSIS — I959 Hypotension, unspecified: Secondary | ICD-10-CM | POA: Diagnosis not present

## 2021-04-17 DIAGNOSIS — B349 Viral infection, unspecified: Secondary | ICD-10-CM | POA: Diagnosis not present

## 2021-04-18 ENCOUNTER — Telehealth: Payer: Self-pay

## 2021-04-18 NOTE — Telephone Encounter (Signed)
Misty Evans called to report that she was seen at [facility] and her bp was low at 97/51.  She was instructed to call our office for instructions.  She was told to hold her propranolol, hydralazine, and irbesartan.  Dr. Tobie Poet advised that she continue to hold the medications and monitor her bp over the weekend.  She was instructed to call us back on Monday with her readings.

## 2021-05-06 ENCOUNTER — Ambulatory Visit (INDEPENDENT_AMBULATORY_CARE_PROVIDER_SITE_OTHER): Payer: Medicare Other | Admitting: Physician Assistant

## 2021-05-06 ENCOUNTER — Encounter: Payer: Self-pay | Admitting: Physician Assistant

## 2021-05-06 ENCOUNTER — Other Ambulatory Visit: Payer: Self-pay

## 2021-05-06 VITALS — BP 118/72 | HR 67 | Temp 97.2°F | Ht 60.0 in | Wt 88.2 lb

## 2021-05-06 DIAGNOSIS — N3001 Acute cystitis with hematuria: Secondary | ICD-10-CM | POA: Diagnosis not present

## 2021-05-06 LAB — POCT URINALYSIS DIP (CLINITEK)
Bilirubin, UA: NEGATIVE
Glucose, UA: NEGATIVE mg/dL
Ketones, POC UA: NEGATIVE mg/dL
Nitrite, UA: POSITIVE — AB
POC PROTEIN,UA: 100 — AB
Spec Grav, UA: 1.025 (ref 1.010–1.025)
Urobilinogen, UA: 0.2 E.U./dL
pH, UA: 6 (ref 5.0–8.0)

## 2021-05-06 MED ORDER — PHENAZOPYRIDINE HCL 200 MG PO TABS
200.0000 mg | ORAL_TABLET | Freq: Three times a day (TID) | ORAL | 0 refills | Status: DC | PRN
Start: 2021-05-06 — End: 2021-06-25

## 2021-05-06 MED ORDER — CIPROFLOXACIN HCL 250 MG PO TABS: 250.0000 mg | ORAL_TABLET | Freq: Two times a day (BID) | ORAL | 0 refills | Status: AC

## 2021-05-06 NOTE — Progress Notes (Signed)
Acute Office Visit  Subjective:    Patient ID: Misty Evans, female    DOB: [birth year], 70 y.o.   MRN: [id number]  Chief Complaint  Patient presents with   Urinary Tract Infection    HPI: Patient is in today for complaints of urinary frequency and dysuria since Friday - did have a fever earlier this weekend but not now Denies nausea, vomiting or abdominal pain Has noted strong odor to urine  Past Medical History:  Diagnosis Date   COPD (chronic obstructive pulmonary disease) ([place])    Depression    GERD (gastroesophageal reflux disease)    Hypertension    Insomnia    Osteoporosis    Renal stones 05/2015    Past Surgical History:  Procedure Laterality Date   ABDOMINAL HYSTERECTOMY     APPENDECTOMY     BACK SURGERY     x2   CHOLECYSTECTOMY     COLONOSCOPY      Family History  Problem Relation Age of Onset   Hypertension Mother    Cancer Father        esophageal   Hypertension Father    Hypertension Maternal Grandmother    Heart attack Maternal Grandmother    Diabetes Paternal Grandmother    Hypertension Paternal Grandmother    Colon cancer Neg Hx    Pancreatic cancer Neg Hx    Esophageal cancer Neg Hx    Rectal cancer Neg Hx    Stomach cancer Neg Hx     Social History   Socioeconomic History   Marital status: Married    Spouse name: Charles   Number of children: 2   Years of education: Not on file   Highest education level: Not on file  Occupational History   Occupation: Retired  Tobacco Use   Smoking status: Every Day    Packs/day: 0.25    Years: 54.00    Pack years: 13.50    Types: Cigarettes   Smokeless tobacco: Never   Tobacco comments:    Has smoked since 71 years old  Vaping Use   Vaping Use: Never used  Substance and Sexual Activity   Alcohol use: Not Currently   Drug use: Never   Sexual activity: Not on file  Other Topics Concern   Not on file  Social History Narrative   Not on file   Social Determinants of Health    Financial Resource Strain: Not on file  Food Insecurity: Not on file  Transportation Needs: Not on file  Physical Activity: Not on file  Stress: Not on file  Social Connections: Not on file  Intimate Partner Violence: Not on file    Outpatient Medications Prior to Visit  Medication Sig Dispense Refill   albuterol (ACCUNEB) 1.25 MG/3ML nebulizer solution Take 1 ampule by nebulization every 6 (six) hours as needed for wheezing.     albuterol (PROAIR HFA) 108 (90 Base) MCG/ACT inhaler Inhale 2 puffs into the lungs every 6 (six) hours as needed for wheezing or shortness of breath. 8 g 0   aspirin 81 MG chewable tablet Chew 81 mg by mouth daily.     Budeson-Glycopyrrol-Formoterol (BREZTRI AEROSPHERE) 160-9-4.8 MCG/ACT AERO Inhale 1 application into the lungs in the morning and at bedtime.     FLUoxetine (PROZAC) 10 MG tablet Take 1 tablet by mouth once daily 90 tablet 0   hydrALAZINE (APRESOLINE) 100 MG tablet TAKE 1 TABLET BY MOUTH  TWICE DAILY WITH FOOD 180 tablet 3   ibuprofen (ADVIL) 200 MG  tablet Take 800 mg by mouth every 6 (six) hours as needed. Takes 4 tablets as needed for back pain     irbesartan (AVAPRO) 300 MG tablet TAKE 1 TABLET BY MOUTH ONCE DAILY 90 tablet 3   pantoprazole (PROTONIX) 40 MG tablet Take 1 tablet (40 mg total) by mouth daily. 90 tablet 4   pravastatin (PRAVACHOL) 10 MG tablet TAKE 1 TABLET BY MOUTH ONCE DAILY 90 tablet 1   propranolol ER (INDERAL LA) 120 MG 24 hr capsule TAKE 1 CAPSULE BY MOUTH  DAILY 90 capsule 1   Biotin 10000 MCG TABS Take by mouth daily.      vitamin B-12 (CYANOCOBALAMIN) 1000 MCG tablet Take 1,000 mcg by mouth daily.     No facility-administered medications prior to visit.    Allergies  Allergen Reactions   Penicillin G Rash   Sulfamethoxazole Rash    Review of Systems   CONSTITUTIONAL: Negative for chills, fatigue, fever, unintentional weight gain and unintentional weight loss.  CARDIOVASCULAR: Negative for chest pain,  dizziness, palpitations and pedal edema.  RESPIRATORY: Negative for recent cough and dyspnea.  GU - see HPI INTEGUMENTARY: Negative for rash.       Objective:    Physical Exam PHYSICAL EXAM:   VS: BP 118/72 (BP Location: Left Arm, Patient Position: Sitting, Cuff Size: Normal)   Pulse 67   Temp (!) 97.2 F (36.2 C) (Temporal)   Ht 5' (1.524 m)   Wt 88 lb 3.2 oz (40 kg)   SpO2 96%   BMI 17.23 kg/m   GEN: Well nourished, well developed, in no acute distress  Cardiac: RRR; no murmurs, rubs, or gallops,no edema - Respiratory:  normal respiratory rate and pattern with no distress - normal breath sounds with no rales, rhonchi, wheezes or rubs GI: normal bowel sounds, no masses or tenderness  Office Visit on 05/06/2021  Component Date Value Ref Range Status   Color, UA 05/06/2021 yellow  yellow Final   Clarity, UA 05/06/2021 clear  clear Final   Glucose, UA 05/06/2021 negative  negative mg/dL Final   Bilirubin, UA 05/06/2021 negative  negative Final   Ketones, POC UA 05/06/2021 negative  negative mg/dL Final   Spec Grav, UA 05/06/2021 1.025  1.010 - 1.025 Final   Blood, UA 05/06/2021 moderate (A)  negative Final   pH, UA 05/06/2021 6.0  5.0 - 8.0 Final   POC PROTEIN,UA 05/06/2021 =100 (A)  negative, trace Final   Urobilinogen, UA 05/06/2021 0.2  0.2 or 1.0 E.U./dL Final   Nitrite, UA 05/06/2021 Positive (A)  Negative Final   Leukocytes, UA 05/06/2021 Large (3+) (A)  Negative Final     BP 118/72 (BP Location: Left Arm, Patient Position: Sitting, Cuff Size: Normal)   Pulse 67   Temp (!) 97.2 F (36.2 C) (Temporal)   Ht 5' (1.524 m)   Wt 88 lb 3.2 oz (40 kg)   SpO2 96%   BMI 17.23 kg/m  Wt Readings from Last 3 Encounters:  05/06/21 88 lb 3.2 oz (40 kg)  01/02/21 87 lb (39.5 kg)  10/24/20 87 lb 9.6 oz (39.7 kg)    Health Maintenance Due  Topic Date Due   Hepatitis C Screening  Never done   TETANUS/TDAP  Never done   Zoster Vaccines- Shingrix (1 of 2) Never done     There are no preventive care reminders to display for this patient.   Lab Results  Component Value Date   TSH 2.080 08/17/2020   Lab Results  Component Value Date   WBC 6.4 10/23/2020   HGB 16.0 (H) 10/23/2020   HCT 50.1 (H) 10/23/2020   MCV 88 10/23/2020   PLT 219 10/23/2020   Lab Results  Component Value Date   NA 141 10/23/2020   K 4.3 10/23/2020   CO2 23 10/23/2020   GLUCOSE 83 10/23/2020   BUN 17 10/23/2020   CREATININE 0.92 10/23/2020   BILITOT 0.4 10/23/2020   ALKPHOS 81 10/23/2020   AST 23 10/23/2020   ALT 11 10/23/2020   PROT 7.3 10/23/2020   ALBUMIN 4.4 10/23/2020   CALCIUM 9.8 10/23/2020   EGFR 67 10/23/2020   Lab Results  Component Value Date   CHOL 150 08/17/2020   Lab Results  Component Value Date   HDL 49 08/17/2020   Lab Results  Component Value Date   LDLCALC 81 08/17/2020   Lab Results  Component Value Date   TRIG 107 08/17/2020   Lab Results  Component Value Date   CHOLHDL 3.1 08/17/2020   No results found for: HGBA1C     Assessment & Plan:   Problem List Items Addressed This Visit   None Visit Diagnoses     Acute cystitis with hematuria    -  Primary   Relevant Medications   phenazopyridine (PYRIDIUM) 200 MG tablet   ciprofloxacin (CIPRO) 250 MG tablet   Other Relevant Orders   POCT URINALYSIS DIP (CLINITEK) (Completed)   Urine Culture      Meds ordered this encounter  Medications   phenazopyridine (PYRIDIUM) 200 MG tablet    Sig: Take 1 tablet (200 mg total) by mouth 3 (three) times daily as needed for pain.    Dispense:  10 tablet    Refill:  0    Order Specific Question:   Supervising Provider    Answer:   Shelton Silvas   ciprofloxacin (CIPRO) 250 MG tablet    Sig: Take 1 tablet (250 mg total) by mouth 2 (two) times daily for 7 days.    Dispense:  14 tablet    Refill:  0    Order Specific Question:   Supervising Provider    AnswerShelton Silvas    Orders Placed This Encounter   Procedures   Urine Culture   POCT URINALYSIS DIP (CLINITEK)     Follow-up: Return if symptoms worsen or fail to improve.  An After Visit Summary was printed and given to the patient.  Yetta Flock [facility] [phone]

## 2021-05-09 LAB — URINE CULTURE

## 2021-05-15 ENCOUNTER — Other Ambulatory Visit: Payer: Self-pay | Admitting: Family Medicine

## 2021-05-17 ENCOUNTER — Other Ambulatory Visit: Payer: Self-pay

## 2021-05-17 DIAGNOSIS — F330 Major depressive disorder, recurrent, mild: Secondary | ICD-10-CM

## 2021-05-17 MED ORDER — FLUOXETINE HCL 10 MG PO TABS: 10.0000 mg | ORAL_TABLET | Freq: Every day | ORAL | 0 refills | Status: DC

## 2021-05-28 ENCOUNTER — Other Ambulatory Visit: Payer: Self-pay | Admitting: Family Medicine

## 2021-05-28 ENCOUNTER — Ambulatory Visit (INDEPENDENT_AMBULATORY_CARE_PROVIDER_SITE_OTHER): Payer: Medicare Other

## 2021-05-28 ENCOUNTER — Other Ambulatory Visit: Payer: Self-pay

## 2021-05-28 DIAGNOSIS — R8290 Unspecified abnormal findings in urine: Secondary | ICD-10-CM | POA: Diagnosis not present

## 2021-05-28 DIAGNOSIS — R300 Dysuria: Secondary | ICD-10-CM

## 2021-05-28 LAB — POCT URINALYSIS DIP (CLINITEK)
Bilirubin, UA: NEGATIVE
Glucose, UA: NEGATIVE mg/dL
Ketones, POC UA: NEGATIVE mg/dL
Nitrite, UA: NEGATIVE
POC PROTEIN,UA: 100 — AB
Spec Grav, UA: >=1.03 — AB (ref 1.010–1.025)
Urobilinogen, UA: 0.2 E.U./dL
pH, UA: 6 (ref 5.0–8.0)

## 2021-05-28 MED ORDER — NITROFURANTOIN MONOHYD MACRO 100 MG PO CAPS: 100.0000 mg | ORAL_CAPSULE | Freq: Two times a day (BID) | ORAL | 0 refills | Status: DC

## 2021-05-28 NOTE — Progress Notes (Signed)
Patient came into office for UA recheck. UA positive. Urine culture sent. Provider sent new medication to [facility] in [place]. Pt is complaining of new smell, burning with urination, and lower back pain.   Misty Evans, [place] 05/28/21 10:56 AM

## 2021-05-30 LAB — URINE CULTURE: Organism ID, Bacteria: NO GROWTH

## 2021-06-25 ENCOUNTER — Ambulatory Visit (INDEPENDENT_AMBULATORY_CARE_PROVIDER_SITE_OTHER): Payer: Medicare Other | Admitting: Family Medicine

## 2021-06-25 ENCOUNTER — Encounter: Payer: Self-pay | Admitting: Family Medicine

## 2021-06-25 ENCOUNTER — Other Ambulatory Visit: Payer: Self-pay

## 2021-06-25 VITALS — BP 136/90 | HR 72 | Temp 98.4°F | Resp 14 | Ht 60.0 in | Wt 94.0 lb

## 2021-06-25 DIAGNOSIS — I10 Essential (primary) hypertension: Secondary | ICD-10-CM

## 2021-06-25 DIAGNOSIS — R300 Dysuria: Secondary | ICD-10-CM | POA: Diagnosis not present

## 2021-06-25 DIAGNOSIS — J3089 Other allergic rhinitis: Secondary | ICD-10-CM

## 2021-06-25 DIAGNOSIS — E782 Mixed hyperlipidemia: Secondary | ICD-10-CM

## 2021-06-25 DIAGNOSIS — J432 Centrilobular emphysema: Secondary | ICD-10-CM

## 2021-06-25 DIAGNOSIS — I700 Atherosclerosis of aorta: Secondary | ICD-10-CM | POA: Insufficient documentation

## 2021-06-25 DIAGNOSIS — F330 Major depressive disorder, recurrent, mild: Secondary | ICD-10-CM

## 2021-06-25 DIAGNOSIS — N3000 Acute cystitis without hematuria: Secondary | ICD-10-CM

## 2021-06-25 DIAGNOSIS — N3001 Acute cystitis with hematuria: Secondary | ICD-10-CM | POA: Insufficient documentation

## 2021-06-25 HISTORY — DX: Atherosclerosis of aorta: I70.0

## 2021-06-25 LAB — POCT URINALYSIS DIPSTICK
Bilirubin, UA: NEGATIVE
Blood, UA: 6.5
Glucose, UA: NEGATIVE
Ketones, UA: NEGATIVE
Nitrite, UA: NEGATIVE
Protein, UA: POSITIVE — AB
Spec Grav, UA: 1.025 (ref 1.010–1.025)
Urobilinogen, UA: NEGATIVE E.U./dL — AB
pH, UA: 5 (ref 5.0–8.0)

## 2021-06-25 MED ORDER — MONTELUKAST SODIUM 10 MG PO TABS: 10.0000 mg | ORAL_TABLET | Freq: Every day | ORAL | 3 refills | Status: DC

## 2021-06-25 MED ORDER — NITROFURANTOIN MONOHYD MACRO 100 MG PO CAPS: 100.0000 mg | ORAL_CAPSULE | Freq: Two times a day (BID) | ORAL | 0 refills | Status: DC

## 2021-06-25 NOTE — Assessment & Plan Note (Addendum)
The current medical regimen is effective;  continue present plan and medications. Try to get breztri up to 2 puffs twice a day  I am proud of her for quitting smoking. Encouraged continued abstinence.

## 2021-06-25 NOTE — Patient Instructions (Addendum)
Increase prozac 20 mg 2 daily.  Call if working and will send 90-day prescription to mail order.  Macrobid sent for urinary tract infection.  Singulair 10 mg once daily for allergies.  May also take Zyrtec, Claritin or Allegra.

## 2021-06-25 NOTE — Assessment & Plan Note (Addendum)
Continue pravastatin and aspirin 81 mg daily.

## 2021-06-25 NOTE — Progress Notes (Signed)
Subjective:  Patient ID: [name], female    DOB: 04-16-1951  Age: 71 y.o. MRN: [id number]  Chief Complaint  Patient presents with   Hyperlipidemia   Hypertension    HPI Hypertension: usually runs 120-130s/70s. Hydralazine 100 mg twice daily, avapro 300 mg once daily, and propranolol ER 120 mg once daily.   UTI diagnosed early in 05/2021 treated. Repeat UA end of December abnormal, but urine culture negative.  Dysuria, increased urine, urgency. No hematuria.   COPD: quit smoking in October after smoking 50+ yrs and was up to 1 ppd. Breztri 2 puffs once a day. Forgets to take second dose. On proair/albuterol 2-3 times per week. Allergies are bothering her.   Mixed Hyperlipidemia/Aortic atherosclerosis: On pravastatin 10 mg before bed.   Depression: on prozac 20 mg once daily. Would like to increase dose. Patient has had increased stress lately.   History of malnutrition. Patient has gained good amount of weight and is eating well.   Current Outpatient Medications on File Prior to Visit  Medication Sig Dispense Refill   albuterol (ACCUNEB) 1.25 MG/3ML nebulizer solution Take 1 ampule by nebulization every 6 (six) hours as needed for wheezing.     albuterol (PROAIR HFA) 108 (90 Base) MCG/ACT inhaler Inhale 2 puffs into the lungs every 6 (six) hours as needed for wheezing or shortness of breath. 8 g 0   aspirin 81 MG chewable tablet Chew 81 mg by mouth daily.     Budeson-Glycopyrrol-Formoterol (BREZTRI AEROSPHERE) 160-9-4.8 MCG/ACT AERO Inhale 1 application into the lungs in the morning and at bedtime.     hydrALAZINE (APRESOLINE) 100 MG tablet TAKE 1 TABLET BY MOUTH  TWICE DAILY WITH FOOD 180 tablet 3   ibuprofen (ADVIL) 200 MG tablet Take 800 mg by mouth every 6 (six) hours as needed. Takes 4 tablets as needed for back pain     irbesartan (AVAPRO) 300 MG tablet TAKE 1 TABLET BY MOUTH ONCE DAILY 90 tablet 3   propranolol ER (INDERAL LA) 120 MG 24 hr capsule TAKE 1  CAPSULE BY MOUTH  DAILY 90 capsule 3   [DISCONTINUED] FLUoxetine (PROZAC) 10 MG tablet Take 1 tablet (10 mg total) by mouth daily. 90 tablet 0   No current facility-administered medications on file prior to visit.   Past Medical History:  Diagnosis Date   COPD (chronic obstructive pulmonary disease) ([place])    Depression    GERD (gastroesophageal reflux disease)    Hypertension    Insomnia    Osteoporosis    Renal stones 05/2015   Past Surgical History:  Procedure Laterality Date   ABDOMINAL HYSTERECTOMY     APPENDECTOMY     BACK SURGERY     x2   CHOLECYSTECTOMY     COLONOSCOPY      Family History  Problem Relation Age of Onset   Hypertension Mother    Cancer Father        esophageal   Hypertension Father    Hypertension Maternal Grandmother    Heart attack Maternal Grandmother    Diabetes Paternal Grandmother    Hypertension Paternal Grandmother    Colon cancer Neg Hx    Pancreatic cancer Neg Hx    Esophageal cancer Neg Hx    Rectal cancer Neg Hx    Stomach cancer Neg Hx    Social History   Socioeconomic History   Marital status: Married    Spouse name: Juanda Crumble   Number of children: 2   Years of education: Not  on file   Highest education level: Not on file  Occupational History   Occupation: Retired  Tobacco Use   Smoking status: Former    Packs/day: 0.25    Years: 54.00    Pack years: 13.50    Types: Cigarettes    Quit date: 03/03/2021    Years since quitting: 0.3   Smokeless tobacco: Never   Tobacco comments:    Has smoked since 71 years old  Vaping Use   Vaping Use: Never used  Substance and Sexual Activity   Alcohol use: Not Currently   Drug use: Never   Sexual activity: Not on file  Other Topics Concern   Not on file  Social History Narrative   Not on file   Social Determinants of Health   Financial Resource Strain: Not on file  Food Insecurity: Not on file  Transportation Needs: Not on file  Physical Activity: Not on file  Stress: Not  on file  Social Connections: Not on file    Review of Systems  Constitutional:  Negative for chills, fatigue and fever.  HENT:  Positive for congestion and rhinorrhea. Negative for sore throat.   Respiratory:  Positive for shortness of breath. Negative for cough.   Cardiovascular:  Negative for chest pain.  Gastrointestinal:  Negative for abdominal pain, constipation, diarrhea, nausea and vomiting.  Genitourinary:  Positive for dysuria and frequency. Negative for urgency.  Musculoskeletal:  Positive for back pain. Negative for myalgias.  Neurological:  Negative for dizziness, weakness, light-headedness and headaches.  Psychiatric/Behavioral:  Positive for dysphoric mood and sleep disturbance. Negative for self-injury and suicidal ideas. The patient is nervous/anxious.     Objective:  BP 136/90    Pulse 72    Temp 98.4 F (36.9 C)    Resp 14    Ht 5' (1.524 m)    Wt 94 lb (42.6 kg)    BMI 18.36 kg/m   BP/Weight 06/25/2021 30/0/7622 11/02/3352  Systolic BP 562 563 893  Diastolic BP 90 72 78  Wt. (Lbs) 94 88.2 87  BMI 18.36 17.23 16.99    Physical Exam Vitals reviewed.  Constitutional:      Appearance: Normal appearance. She is normal weight.  Neck:     Vascular: No carotid bruit.  Cardiovascular:     Rate and Rhythm: Normal rate and regular rhythm.     Heart sounds: Normal heart sounds.  Pulmonary:     Effort: Pulmonary effort is normal. No respiratory distress.     Breath sounds: Normal breath sounds.  Abdominal:     General: Abdomen is flat. Bowel sounds are normal.     Palpations: Abdomen is soft.     Tenderness: There is no abdominal tenderness.  Neurological:     Mental Status: She is alert and oriented to person, place, and time.  Psychiatric:        Behavior: Behavior normal.     Comments: Mildly depressed     Diabetic Foot Exam - Simple   No data filed      Lab Results  Component Value Date   WBC 8.0 06/25/2021   HGB 15.1 06/25/2021   HCT 46.6 06/25/2021    PLT 245 06/25/2021   GLUCOSE 79 06/25/2021   CHOL 176 06/25/2021   TRIG 100 06/25/2021   HDL 63 06/25/2021   LDLCALC 95 06/25/2021   ALT 8 06/25/2021   AST 23 06/25/2021   NA 139 06/25/2021   K 4.7 06/25/2021   CL 101 06/25/2021  CREATININE 1.07 (H) 06/25/2021   BUN 14 06/25/2021   CO2 28 06/25/2021   TSH 1.730 06/25/2021      Assessment & Plan:   Problem List Items Addressed This Visit       Cardiovascular and Mediastinum   Essential hypertension    Fairly well controlled.  No changes to medicines. Continue Hydralazine 100 mg twice daily, avapro 300 mg once daily, and propranolol ER 120 mg once daily. Continue to work on eating a healthy diet and exercise.  Labs drawn today.        Relevant Orders   CBC with Differential/Platelet (Completed)   Comprehensive metabolic panel (Completed)   TSH (Completed)   AMB Referral to Community Care Coordinaton   Aortic atherosclerosis ([place])    Continue pravastatin and aspirin 81 mg daily.       Relevant Orders   AMB Referral to [facility]     Respiratory   Centrilobular emphysema ([place])    The current medical regimen is effective;  continue present plan and medications. Try to get breztri up to 2 puffs twice a day  I am proud of her for quitting smoking. Encouraged continued abstinence.       Relevant Medications   montelukast (SINGULAIR) 10 MG tablet   Other Relevant Orders   AMB Referral to Community Care Coordinaton   Allergic rhinitis due to allergen     Singulair 10 mg once daily for allergies.  May also take Zyrtec, Claritin or Allegra.         Genitourinary   Acute cystitis without hematuria - Primary    Rx: macrobid       Relevant Orders   POCT urinalysis dipstick (Completed)   Urine Culture (Completed)     Other   Mixed hyperlipidemia    Well controlled.  No changes to medicines.  Continue to work on eating a healthy diet and exercise.  Labs drawn today.        Relevant  Orders   Lipid panel (Completed)   AMB Referral to Community Care Coordinaton   Depression, major, recurrent, mild (HCC)    Increase prozac 20 mg 2 daily.      .  Meds ordered this encounter  Medications   nitrofurantoin, macrocrystal-monohydrate, (MACROBID) 100 MG capsule    Sig: Take 1 capsule (100 mg total) by mouth 2 (two) times daily.    Dispense:  14 capsule    Refill:  0   montelukast (SINGULAIR) 10 MG tablet    Sig: Take 1 tablet (10 mg total) by mouth at bedtime.    Dispense:  30 tablet    Refill:  3    Orders Placed This Encounter  Procedures   Urine Culture   CBC with Differential/Platelet   Comprehensive metabolic panel   Lipid panel   TSH   Cardiovascular Risk Assessment   AMB Referral to [place]   POCT urinalysis dipstick     Follow-up: Return in about 3 months (around 09/23/2021) for chronic fasting.  An After Visit Summary was printed and given to the patient.  Rochel Brome, MD [facility] [phone]

## 2021-06-26 LAB — CBC WITH DIFFERENTIAL/PLATELET
Basophils Absolute: 0.1 10*3/uL (ref 0.0–0.2)
Basos: 1 %
EOS (ABSOLUTE): 0.3 10*3/uL (ref 0.0–0.4)
Eos: 4 %
Hematocrit: 46.6 % (ref 34.0–46.6)
Hemoglobin: 15.1 g/dL (ref 11.1–15.9)
Immature Grans (Abs): 0.1 10*3/uL (ref 0.0–0.1)
Immature Granulocytes: 1 %
Lymphocytes Absolute: 1.7 10*3/uL (ref 0.7–3.1)
Lymphs: 21 %
MCH: 27.6 pg (ref 26.6–33.0)
MCHC: 32.4 g/dL (ref 31.5–35.7)
MCV: 85 fL (ref 79–97)
Monocytes Absolute: 0.5 10*3/uL (ref 0.1–0.9)
Monocytes: 6 %
Neutrophils Absolute: 5.4 10*3/uL (ref 1.4–7.0)
Neutrophils: 67 %
Platelets: 245 10*3/uL (ref 150–450)
RBC: 5.47 x10E6/uL — ABNORMAL HIGH (ref 3.77–5.28)
RDW: 16.7 % — ABNORMAL HIGH (ref 11.7–15.4)
WBC: 8 10*3/uL (ref 3.4–10.8)

## 2021-06-26 LAB — LIPID PANEL
Chol/HDL Ratio: 2.8 ratio (ref 0.0–4.4)
Cholesterol, Total: 176 mg/dL (ref 100–199)
HDL: 63 mg/dL (ref >39)
LDL Chol Calc (NIH): 95 mg/dL (ref 0–99)
Triglycerides: 100 mg/dL (ref 0–149)
VLDL Cholesterol Cal: 18 mg/dL (ref 5–40)

## 2021-06-26 LAB — COMPREHENSIVE METABOLIC PANEL
ALT: 8 IU/L (ref 0–32)
AST: 23 IU/L (ref 0–40)
Albumin/Globulin Ratio: 1.2 (ref 1.2–2.2)
Albumin: 3.9 g/dL (ref 3.7–4.7)
Alkaline Phosphatase: 62 IU/L (ref 44–121)
BUN/Creatinine Ratio: 13 (ref 12–28)
BUN: 14 mg/dL (ref 8–27)
Bilirubin Total: 0.6 mg/dL (ref 0.0–1.2)
CO2: 28 mmol/L (ref 20–29)
Calcium: 9.4 mg/dL (ref 8.7–10.3)
Chloride: 101 mmol/L (ref 96–106)
Creatinine, Ser: 1.07 mg/dL — ABNORMAL HIGH (ref 0.57–1.00)
Globulin, Total: 3.2 g/dL (ref 1.5–4.5)
Glucose: 79 mg/dL (ref 70–99)
Potassium: 4.7 mmol/L (ref 3.5–5.2)
Sodium: 139 mmol/L (ref 134–144)
Total Protein: 7.1 g/dL (ref 6.0–8.5)
eGFR: 56 mL/min/{1.73_m2} — ABNORMAL LOW (ref >59)

## 2021-06-26 LAB — URINE CULTURE

## 2021-06-26 LAB — CARDIOVASCULAR RISK ASSESSMENT

## 2021-06-26 LAB — TSH: TSH: 1.73 u[IU]/mL (ref 0.450–4.500)

## 2021-06-26 NOTE — Progress Notes (Signed)
Blood count normal.  Liver function normal.  Kidney function little abnormal. Recommend increase hydration. Return in 1 month for repeat cmp.  Thyroid function normal.  Cholesterol: good

## 2021-06-27 ENCOUNTER — Other Ambulatory Visit: Payer: Self-pay

## 2021-06-27 ENCOUNTER — Other Ambulatory Visit: Payer: Self-pay | Admitting: Family Medicine

## 2021-06-27 DIAGNOSIS — N289 Disorder of kidney and ureter, unspecified: Secondary | ICD-10-CM

## 2021-07-01 ENCOUNTER — Telehealth: Payer: Self-pay | Admitting: *Deleted

## 2021-07-01 ENCOUNTER — Telehealth: Payer: Self-pay

## 2021-07-01 NOTE — Telephone Encounter (Signed)
Made patient aware.   Misty Evans 07/01/21 4:34 PM

## 2021-07-01 NOTE — Telephone Encounter (Signed)
Patient states that starting Macrobid last week she has been having diarrhea and is requesting something not as strong for her UTI -

## 2021-07-01 NOTE — Chronic Care Management (AMB) (Signed)
Chronic Care Management   Note  07/01/2021 Name: Misty Evans MRN: [id number] DOB: 1950/12/21  Misty Evans is a 71 y.o. year old female who is a primary care patient of Cox, Kirsten, MD. I reached out to [name] by phone today in response to a referral sent by Ms. Misty Gee Carrier's PCP.  Ms. Stump was given information about Chronic Care Management services today including:  CCM service includes personalized support from designated clinical staff supervised by her physician, including individualized plan of care and coordination with other care providers 24/7 contact phone numbers for assistance for urgent and routine care needs. Service will only be billed when office clinical staff spend 20 minutes or more in a month to coordinate care. Only one practitioner may furnish and bill the service in a calendar month. The patient may stop CCM services at any time (effective at the end of the month) by phone call to the office staff. The patient is responsible for co-pay (up to 20% after annual deductible is met) if co-pay is required by the individual health plan.   Patient agreed to services and verbal consent obtained.   Follow up plan: Telephone appointment with care management team member scheduled for:07/11/21  [facility]  Direct Dial: [phone]

## 2021-07-03 ENCOUNTER — Encounter: Payer: Self-pay | Admitting: Family Medicine

## 2021-07-03 DIAGNOSIS — J309 Allergic rhinitis, unspecified: Secondary | ICD-10-CM | POA: Insufficient documentation

## 2021-07-03 HISTORY — DX: Allergic rhinitis, unspecified: J30.9

## 2021-07-03 NOTE — Assessment & Plan Note (Addendum)
Rx macrobid  

## 2021-07-03 NOTE — Assessment & Plan Note (Signed)
Well controlled.  ?No changes to medicines.  ?Continue to work on eating a healthy diet and exercise.  ?Labs drawn today.  ?

## 2021-07-03 NOTE — Assessment & Plan Note (Signed)
Fairly well controlled.  No changes to medicines. Continue Hydralazine 100 mg twice daily, avapro 300 mg once daily, and propranolol ER 120 mg once daily. Continue to work on eating a healthy diet and exercise.  Labs drawn today.

## 2021-07-03 NOTE — Assessment & Plan Note (Signed)
Increase prozac 20 mg 2 daily.

## 2021-07-03 NOTE — Assessment & Plan Note (Signed)
°  Singulair 10 mg once daily for allergies.  May also take Zyrtec, Claritin or Allegra.

## 2021-07-11 ENCOUNTER — Telehealth: Payer: Medicare Other

## 2021-07-11 ENCOUNTER — Other Ambulatory Visit: Payer: Self-pay | Admitting: Family Medicine

## 2021-07-12 ENCOUNTER — Telehealth: Payer: Self-pay | Admitting: *Deleted

## 2021-07-12 NOTE — Chronic Care Management (AMB) (Signed)
°  Care Management   Note  07/12/2021 Name: Misty Evans MRN: [id number] DOB: 02-Apr-1951  Misty Evans is a 71 y.o. year old female who is a primary care patient of Cox, Kirsten, MD and is actively engaged with the care management team. I reached out to [name] by phone today to assist with re-scheduling an initial visit with the RN Case Manager  Follow up plan: Unsuccessful telephone outreach attempt made. A HIPAA compliant phone message was left for the patient providing contact information and requesting a return call.  The care management team will reach out to the patient again over the next 7 days.  If patient returns call to provider office, please advise to call [facility]  at [place] [facility]  Direct Dial: [phone]

## 2021-07-19 NOTE — Chronic Care Management (AMB) (Signed)
°  Care Management   Note  07/19/2021 Name: Misty Evans MRN: [id number] DOB: 07-17-1950  Misty Evans is a 71 y.o. year old female who is a primary care patient of Cox, Kirsten, MD and is actively engaged with the care management team. I reached out to [name] by phone today to assist with re-scheduling an initial visit with the RN Case Manager  Follow up plan: Patient declines further follow up and engagement by the care management team. Appropriate care team members and provider have been notified via electronic communication. The care management team is available to follow up with the patient after provider conversation with the patient regarding recommendation for care management engagement and subsequent re-referral to the care management team.   [facility]  Direct Dial: [phone]

## 2021-07-25 ENCOUNTER — Ambulatory Visit: Payer: Medicare Other

## 2021-08-22 ENCOUNTER — Other Ambulatory Visit: Payer: Self-pay | Admitting: Family Medicine

## 2021-08-26 NOTE — Telephone Encounter (Signed)
Refill sent to pharmacy.   

## 2021-09-03 ENCOUNTER — Other Ambulatory Visit: Payer: Self-pay

## 2021-09-04 MED ORDER — BREZTRI AEROSPHERE 160-9-4.8 MCG/ACT IN AERO: 1.0000 "application " | INHALATION_SPRAY | Freq: Two times a day (BID) | RESPIRATORY_TRACT | 3 refills | Status: DC

## 2021-09-09 ENCOUNTER — Telehealth: Payer: Self-pay

## 2021-09-09 NOTE — Progress Notes (Signed)
? ? ?Chronic Care Management ?Pharmacy Assistant  ? ?Name: Misty Evans  MRN: [id number] DOB: 1950/12/16 ? ? ?Reason for Encounter: Disease State call for Lipids  ?  ?Recent office visits:  ?07/01/21 Rhae Hammock LPN. Telephone encounter. May stop macrobid. ? ?06/25/21 Cox,Kirsten MD. Seen for Acute Cystitis. Referral to community care coordination. Started on Montelukast Sodium '10mg'$  daily at bedtime. D/C Fluoxetine HCI '10mg'$ , Pantoprazole Sodium '40mg'$  and Pyridium HCI '200mg'$ . Increase prozac 20 mg 2 daily.  ? ?05/06/21 Marge Duncans PA-C. Seen for UTI. Started on Ciprofloxacin HCI '250mg'$ , Started on Pyridium HCI '200mg'$ .  ? ?Recent consult visits:  ?None ? ?Hospital visits:  ?None ? ?Medications: ?Outpatient Encounter Medications as of 09/09/2021  ?Medication Sig  ? albuterol (ACCUNEB) 1.25 MG/3ML nebulizer solution Take 1 ampule by nebulization every 6 (six) hours as needed for wheezing.  ? albuterol (PROAIR HFA) 108 (90 Base) MCG/ACT inhaler Inhale 2 puffs into the lungs every 6 (six) hours as needed for wheezing or shortness of breath.  ? aspirin 81 MG chewable tablet Chew 81 mg by mouth daily.  ? Budeson-Glycopyrrol-Formoterol (BREZTRI AEROSPHERE) 160-9-4.8 MCG/ACT AERO Inhale 1 application. into the lungs in the morning and at bedtime. Inhale 1 application into the lungs in the morning and at bedtime.  ? hydrALAZINE (APRESOLINE) 100 MG tablet TAKE 1 TABLET BY MOUTH  TWICE DAILY WITH FOOD  ? ibuprofen (ADVIL) 200 MG tablet Take 800 mg by mouth every 6 (six) hours as needed. Takes 4 tablets as needed for back pain  ? irbesartan (AVAPRO) 300 MG tablet TAKE 1 TABLET BY MOUTH ONCE DAILY  ? montelukast (SINGULAIR) 10 MG tablet Take 1 tablet (10 mg total) by mouth at bedtime.  ? nitrofurantoin, macrocrystal-monohydrate, (MACROBID) 100 MG capsule Take 1 capsule (100 mg total) by mouth 2 (two) times daily.  ? pravastatin (PRAVACHOL) 10 MG tablet TAKE 1 TABLET BY MOUTH ONCE DAILY  ? propranolol ER (INDERAL LA) 120 MG  24 hr capsule TAKE 1 CAPSULE BY MOUTH  DAILY  ? [DISCONTINUED] FLUoxetine (PROZAC) 10 MG tablet Take 1 tablet (10 mg total) by mouth daily.  ? ?No facility-administered encounter medications on file as of 09/09/2021.  ? ? ?Lipid Panel ?   ?Component Value Date/Time  ? CHOL 176 06/25/2021 1017  ? TRIG 100 06/25/2021 1017  ? HDL 63 06/25/2021 1017  ? [place] 95 06/25/2021 1017  ?  ?10-year ASCVD risk score: The 10-year ASCVD risk score (Arnett DK, et al., 2019) is: 24.9% ?  Values used to calculate the score: ?    Age: 71 years ?    Sex: Female ?    Is Non-Hispanic African American: No ?    Diabetic: No ?    Tobacco smoker: Yes ?    Systolic Blood Pressure: 188 mmHg ?    Is BP treated: Yes ?    HDL Cholesterol: 63 mg/dL ?    Total Cholesterol: 176 mg/dL ? ?Current antihyperlipidemic regimen:  ?Pravastatin '10mg'$  daily  ?Asprin '81mg'$  daily  ? ?ASCVD risk enhancing conditions: age >49 and HTN ? ?What recent interventions/DTPs have been made by any provider to improve Cholesterol control since last CPP Visit: Pt denies any changes ? ?Any recent hospitalizations or ED visits since last visit with CPP? No ? ?What diet changes have been made to improve Cholesterol?  ?Pt tries to watch what she eats  ? ?What exercise is being done to improve Cholesterol?  ?Pt stated she does get some exercise  ? ?Adherence Review: ?  Does the patient have >5 day gap between last estimated fill dates? No ? ?Star Rating Drugs ?Medication Name Last Fill Days supply ?Pravastatin   07/24/21 100ds ?   05/02/21 90ds ? ?Care Gaps: ?Last annual wellness visit?02/07/21 ? ? ?Elray Mcgregor, CMA ?Clinical Pharmacist Assistant  ?[phone]  ?

## 2021-09-23 NOTE — Progress Notes (Signed)
? ?Subjective:  ?Patient ID: Misty Evans, female    DOB: 11-Jan-1951  Age: 71 y.o. MRN: [id number] ? ?Chief Complaint  ?Patient presents with  ? Hyperlipidemia  ? Hypertension  ? ? ?Hyperlipidemia/Aortic atherosclerosis:  ?Patient is currently taking Pravastatin 10 mg take 1 tablet daily. ? ?Hypertension: ?Patient passed out in January 2023. LOC for less than a couple of minutes. Patient did not seek medical care. She had been feeling weak and light headed. Patient opted to discontinue irbesartan 300 mg daily and hydralazine 100 mg twice a day. Patient continued propranolol er 120 mg once daily because she was taking this for tremor. Her bp has been 120-130s/80s.  ? ?Depression:  ?Patient is currently on prozac 10 mg once daily.  ? ?COPD: patient stopped breztri due to hoarseness a couple of months ago. Hoarseness improved, but shortness of breath worsened. Patient has dyspnea on exertion. Has been using albuterol HFA 2 puffs prior to activity. Uses several times per week. Her husband has a nebulizer machine. She is requesting her own nebulizer machine.  ?  ?Current Outpatient Medications on File Prior to Visit  ?Medication Sig Dispense Refill  ? albuterol (ACCUNEB) 1.25 MG/3ML nebulizer solution Take 1 ampule by nebulization every 6 (six) hours as needed for wheezing.    ? aspirin 81 MG chewable tablet Chew 81 mg by mouth daily.    ? ibuprofen (ADVIL) 200 MG tablet Take 800 mg by mouth every 6 (six) hours as needed. Takes 4 tablets as needed for back pain    ? montelukast (SINGULAIR) 10 MG tablet Take 1 tablet (10 mg total) by mouth at bedtime. 30 tablet 3  ? pravastatin (PRAVACHOL) 10 MG tablet TAKE 1 TABLET BY MOUTH ONCE DAILY 90 tablet 3  ? propranolol ER (INDERAL LA) 120 MG 24 hr capsule TAKE 1 CAPSULE BY MOUTH  DAILY 90 capsule 3  ? ?No current facility-administered medications on file prior to visit.  ? ?Past Medical History:  ?Diagnosis Date  ? COPD (chronic obstructive pulmonary disease) ([place])   ?  Depression   ? GERD (gastroesophageal reflux disease)   ? Hypertension   ? Insomnia   ? Osteoporosis   ? Renal stones 05/2015  ? ?Past Surgical History:  ?Procedure Laterality Date  ? ABDOMINAL HYSTERECTOMY    ? APPENDECTOMY    ? BACK SURGERY    ? x2  ? CHOLECYSTECTOMY    ? COLONOSCOPY    ?  ?Family History  ?Problem Relation Age of Onset  ? Hypertension Mother   ? Cancer Father   ?     esophageal  ? Hypertension Father   ? Hypertension Maternal Grandmother   ? Heart attack Maternal Grandmother   ? Diabetes Paternal Grandmother   ? Hypertension Paternal Grandmother   ? Colon cancer Neg Hx   ? Pancreatic cancer Neg Hx   ? Esophageal cancer Neg Hx   ? Rectal cancer Neg Hx   ? Stomach cancer Neg Hx   ? ?Social History  ? ?Socioeconomic History  ? Marital status: Married  ?  Spouse name: Juanda Crumble  ? Number of children: 2  ? Years of education: Not on file  ? Highest education level: Not on file  ?Occupational History  ? Occupation: Retired  ?Tobacco Use  ? Smoking status: Former  ?  Packs/day: 0.25  ?  Years: 54.00  ?  Pack years: 13.50  ?  Types: Cigarettes  ?  Quit date: 03/03/2021  ?  Years since quitting:  0.5  ? Smokeless tobacco: Never  ? Tobacco comments:  ?  Has smoked since 71 years old  ?Vaping Use  ? Vaping Use: Never used  ?Substance and Sexual Activity  ? Alcohol use: Not Currently  ? Drug use: Never  ? Sexual activity: Not on file  ?Other Topics Concern  ? Not on file  ?Social History Narrative  ? Not on file  ? ?Social Determinants of Health  ? ?Financial Resource Strain: Not on file  ?Food Insecurity: Not on file  ?Transportation Needs: Not on file  ?Physical Activity: Not on file  ?Stress: Not on file  ?Social Connections: Not on file  ? ? ?Review of Systems  ?Constitutional:  Negative for appetite change, fatigue and fever.  ?HENT:  Negative for congestion, ear pain, sinus pressure and sore throat.   ?Respiratory:  Positive for shortness of breath. Negative for cough, chest tightness and wheezing.    ?Cardiovascular:  Negative for chest pain and palpitations.  ?Gastrointestinal:  Negative for abdominal pain, constipation, diarrhea, nausea and vomiting.  ?Genitourinary:  Positive for decreased urine volume. Negative for dysuria and hematuria.  ?Musculoskeletal:  Negative for arthralgias, back pain, joint swelling and myalgias.  ?Skin:  Negative for rash.  ?Neurological:  Negative for dizziness, weakness and headaches.  ?Psychiatric/Behavioral:  Negative for dysphoric mood. The patient is not nervous/anxious.   ? ? ?Objective:  ?BP 130/80   Pulse 67   Temp (!) 97.4 ?F (36.3 ?C)   Ht 5' (1.524 m)   Wt 99 lb 12.8 oz (45.3 kg)   SpO2 90%   BMI 19.49 kg/m?  ? ? ?  09/24/2021  ?  8:01 AM 06/25/2021  ?  9:48 AM 06/25/2021  ?  9:02 AM  ?BP/Weight  ?Systolic BP 086 578 469  ?Diastolic BP 80 90 629  ?Wt. (Lbs) 99.8  94  ?BMI 19.49 kg/m2  18.36 kg/m2  ? ? ?Physical Exam ?Vitals reviewed.  ?Constitutional:   ?   Appearance: Normal appearance. She is normal weight.  ?Neck:  ?   Vascular: No carotid bruit.  ?Cardiovascular:  ?   Rate and Rhythm: Normal rate and regular rhythm.  ?   Heart sounds: Normal heart sounds.  ?Pulmonary:  ?   Effort: Pulmonary effort is normal. No respiratory distress.  ?   Breath sounds: Normal breath sounds.  ?Abdominal:  ?   General: Abdomen is flat. Bowel sounds are normal.  ?   Palpations: Abdomen is soft.  ?   Tenderness: There is no abdominal tenderness.  ?Neurological:  ?   Mental Status: She is alert and oriented to person, place, and time.  ?Psychiatric:     ?   Mood and Affect: Mood normal.     ?   Behavior: Behavior normal.  ? ? ?Diabetic Foot Exam - Simple   ?No data filed ?  ?  ? ?Lab Results  ?Component Value Date  ? WBC 7.9 09/24/2021  ? HGB 16.1 (H) 09/24/2021  ? HCT 50.1 (H) 09/24/2021  ? PLT 214 09/24/2021  ? GLUCOSE 77 09/24/2021  ? CHOL 176 09/24/2021  ? TRIG 99 09/24/2021  ? HDL 62 09/24/2021  ? [place] 96 09/24/2021  ? ALT 11 09/24/2021  ? AST 25 09/24/2021  ? NA 148 (H)  09/24/2021  ? K 4.5 09/24/2021  ? CL 106 09/24/2021  ? CREATININE 0.94 09/24/2021  ? BUN 17 09/24/2021  ? CO2 26 09/24/2021  ? TSH 1.730 06/25/2021  ? ? ? ? ?  Assessment & Plan:  ? ?Problem List Items Addressed This Visit   ? ?  ? Cardiovascular and Mediastinum  ? Essential hypertension  ?  Continue propranolol 120 mg daily.  ?BP is well controlled off irbesartan and hydralazine.  ?I reinforced the importance of calling us prior to stopping medications.  ? ?  ?  ? Relevant Orders  ? Comprehensive metabolic panel (Completed)  ? Aortic atherosclerosis ([place])  ?  Continue aspirin 81 mg daily  ?Continue atorvastatin 10 mg before bed.  ? ?  ?  ?  ? Respiratory  ? Chronic obstructive bronchitis ([place])  ?  Start ANORO one inhalation daily. Please call me and let me know if it helps. Then I will send a refill of either anoro or symbicort (equivalent inhaler, which may cost less.) ?Sent order for a nebulizer machine and albuterol nebulizer treatments to [facility].  ?  ?  ? Relevant Medications  ? umeclidinium-vilanterol (ANORO ELLIPTA) 62.5-25 MCG/ACT AEPB  ? RESOLVED: Centrilobular emphysema ([place])  ? Relevant Medications  ? umeclidinium-vilanterol (ANORO ELLIPTA) 62.5-25 MCG/ACT AEPB  ? Other Relevant Orders  ? For home use only DME Nebulizer machine  ? Allergic rhinitis due to allergen  ?  Continue singulair. ? ?  ?  ?  ? Genitourinary  ? Acute cystitis with hematuria  ?  Check Urine culture. Only mild changes in UA. Await culture results to treat.  ? ?  ?  ? Relevant Orders  ? Urine Culture (Completed)  ?  ? Other  ? Mixed hyperlipidemia - Primary  ?  Well controlled.  ?No changes to medicines. Continue pravastatin 10 mg before bed.  ?Continue to work on eating a healthy diet and exercise.  ?Labs drawn today.  ? ?  ?  ? Relevant Orders  ? CBC With Diff/Platelet (Completed)  ? Lipid panel (Completed)  ? Depression, major, recurrent, mild ([place])  ?  The current medical regimen is effective;   continue present plan and medications. ? ? ?  ?  ? Visit for screening mammogram  ? Relevant Orders  ? MM DIGITAL SCREENING BILATERAL  ? Dysuria  ? Relevant Orders  ? POCT urinalysis dipstick (Completed)  ?. ? ?Meds order

## 2021-09-24 ENCOUNTER — Ambulatory Visit (INDEPENDENT_AMBULATORY_CARE_PROVIDER_SITE_OTHER): Payer: Medicare Other | Admitting: Family Medicine

## 2021-09-24 ENCOUNTER — Encounter: Payer: Self-pay | Admitting: Family Medicine

## 2021-09-24 VITALS — BP 130/80 | HR 67 | Temp 97.4°F | Ht 60.0 in | Wt 99.8 lb

## 2021-09-24 DIAGNOSIS — J449 Chronic obstructive pulmonary disease, unspecified: Secondary | ICD-10-CM | POA: Diagnosis not present

## 2021-09-24 DIAGNOSIS — F330 Major depressive disorder, recurrent, mild: Secondary | ICD-10-CM

## 2021-09-24 DIAGNOSIS — N3001 Acute cystitis with hematuria: Secondary | ICD-10-CM | POA: Diagnosis not present

## 2021-09-24 DIAGNOSIS — E782 Mixed hyperlipidemia: Secondary | ICD-10-CM

## 2021-09-24 DIAGNOSIS — Z1231 Encounter for screening mammogram for malignant neoplasm of breast: Secondary | ICD-10-CM | POA: Diagnosis not present

## 2021-09-24 DIAGNOSIS — I10 Essential (primary) hypertension: Secondary | ICD-10-CM

## 2021-09-24 DIAGNOSIS — R300 Dysuria: Secondary | ICD-10-CM

## 2021-09-24 DIAGNOSIS — J432 Centrilobular emphysema: Secondary | ICD-10-CM

## 2021-09-24 DIAGNOSIS — I700 Atherosclerosis of aorta: Secondary | ICD-10-CM

## 2021-09-24 DIAGNOSIS — J3089 Other allergic rhinitis: Secondary | ICD-10-CM

## 2021-09-24 LAB — POCT URINALYSIS DIPSTICK
Bilirubin, UA: NEGATIVE
Glucose, UA: NEGATIVE
Ketones, UA: NEGATIVE
Nitrite, UA: NEGATIVE
Protein, UA: POSITIVE — AB
Spec Grav, UA: >=1.03 — AB (ref 1.010–1.025)
Urobilinogen, UA: NEGATIVE E.U./dL — AB
pH, UA: 5.5 (ref 5.0–8.0)

## 2021-09-24 MED ORDER — UMECLIDINIUM-VILANTEROL 62.5-25 MCG/ACT IN AEPB: 1.0000 | INHALATION_SPRAY | Freq: Every day | RESPIRATORY_TRACT | 0 refills | Status: DC

## 2021-09-24 MED ORDER — ALBUTEROL SULFATE HFA 108 (90 BASE) MCG/ACT IN AERS
2.0000 | INHALATION_SPRAY | Freq: Four times a day (QID) | RESPIRATORY_TRACT | 3 refills | Status: DC | PRN
Start: 2021-09-24 — End: 2021-09-26

## 2021-09-24 MED ORDER — FLUOXETINE HCL 10 MG PO TABS: 10.0000 mg | ORAL_TABLET | Freq: Every day | ORAL | 3 refills | Status: DC

## 2021-09-24 NOTE — Patient Instructions (Signed)
Start ANORO one inhalation daily. Please call me and let me know if it helps. Then I will send a refill of either anoro or symbicort (equivalent inhaler, which may cost less.) ?Sent order for a nebulizer machine and albuterol nebulizer treatments to [facility].  ?

## 2021-09-25 LAB — CBC WITH DIFF/PLATELET
Basophils Absolute: 0.1 10*3/uL (ref 0.0–0.2)
Basos: 1 %
EOS (ABSOLUTE): 0.2 10*3/uL (ref 0.0–0.4)
Eos: 3 %
Hematocrit: 50.1 % — ABNORMAL HIGH (ref 34.0–46.6)
Hemoglobin: 16.1 g/dL — ABNORMAL HIGH (ref 11.1–15.9)
Immature Grans (Abs): 0 10*3/uL (ref 0.0–0.1)
Immature Granulocytes: 1 %
Lymphocytes Absolute: 1.6 10*3/uL (ref 0.7–3.1)
Lymphs: 20 %
MCH: 29.5 pg (ref 26.6–33.0)
MCHC: 32.1 g/dL (ref 31.5–35.7)
MCV: 92 fL (ref 79–97)
Monocytes Absolute: 0.5 10*3/uL (ref 0.1–0.9)
Monocytes: 7 %
Neutrophils Absolute: 5.4 10*3/uL (ref 1.4–7.0)
Neutrophils: 68 %
Platelets: 214 10*3/uL (ref 150–450)
RBC: 5.45 x10E6/uL — ABNORMAL HIGH (ref 3.77–5.28)
RDW: 14.6 % (ref 11.7–15.4)
WBC: 7.9 10*3/uL (ref 3.4–10.8)

## 2021-09-25 LAB — COMPREHENSIVE METABOLIC PANEL
ALT: 11 IU/L (ref 0–32)
AST: 25 IU/L (ref 0–40)
Albumin/Globulin Ratio: 1.4 (ref 1.2–2.2)
Albumin: 4.2 g/dL (ref 3.7–4.7)
Alkaline Phosphatase: 62 IU/L (ref 44–121)
BUN/Creatinine Ratio: 18 (ref 12–28)
BUN: 17 mg/dL (ref 8–27)
Bilirubin Total: 0.7 mg/dL (ref 0.0–1.2)
CO2: 26 mmol/L (ref 20–29)
Calcium: 9.7 mg/dL (ref 8.7–10.3)
Chloride: 106 mmol/L (ref 96–106)
Creatinine, Ser: 0.94 mg/dL (ref 0.57–1.00)
Globulin, Total: 3.1 g/dL (ref 1.5–4.5)
Glucose: 77 mg/dL (ref 70–99)
Potassium: 4.5 mmol/L (ref 3.5–5.2)
Sodium: 148 mmol/L — ABNORMAL HIGH (ref 134–144)
Total Protein: 7.3 g/dL (ref 6.0–8.5)
eGFR: 64 mL/min/{1.73_m2} (ref >59)

## 2021-09-25 LAB — LIPID PANEL
Chol/HDL Ratio: 2.8 ratio (ref 0.0–4.4)
Cholesterol, Total: 176 mg/dL (ref 100–199)
HDL: 62 mg/dL (ref >39)
LDL Chol Calc (NIH): 96 mg/dL (ref 0–99)
Triglycerides: 99 mg/dL (ref 0–149)
VLDL Cholesterol Cal: 18 mg/dL (ref 5–40)

## 2021-09-26 ENCOUNTER — Telehealth: Payer: Self-pay

## 2021-09-26 LAB — URINE CULTURE: Organism ID, Bacteria: NO GROWTH

## 2021-09-26 NOTE — Telephone Encounter (Signed)
Patient's pharmacy [facility] called stating that the patient's insurance does not cover Fluoxetine tablets but will cover the fluoxetine capsules. Pharmacy was given the ok to switch so it would be covered and also the patient's albuterol inhaler was not covered for the ventolin generic but is covered for the Proventil generic. Pharmacy was given the ok to switch it to the proventil for the patient. ?

## 2021-09-29 DIAGNOSIS — Z1231 Encounter for screening mammogram for malignant neoplasm of breast: Secondary | ICD-10-CM | POA: Insufficient documentation

## 2021-09-29 DIAGNOSIS — R300 Dysuria: Secondary | ICD-10-CM | POA: Insufficient documentation

## 2021-09-29 NOTE — Assessment & Plan Note (Signed)
Continue aspirin 81 mg daily  ?Continue atorvastatin 10 mg before bed.  ?

## 2021-09-29 NOTE — Assessment & Plan Note (Signed)
Start ANORO one inhalation daily. Please call me and let me know if it helps. Then I will send a refill of either anoro or symbicort (equivalent inhaler, which may cost less.) ?Sent order for a nebulizer machine and albuterol nebulizer treatments to [facility].  ?

## 2021-09-29 NOTE — Assessment & Plan Note (Signed)
Continue singulair 

## 2021-09-29 NOTE — Assessment & Plan Note (Signed)
The current medical regimen is effective;  continue present plan and medications.  

## 2021-09-29 NOTE — Assessment & Plan Note (Signed)
Well controlled.  ?No changes to medicines. Continue pravastatin 10 mg before bed.  ?Continue to work on eating a healthy diet and exercise.  ?Labs drawn today.  ? ?

## 2021-09-29 NOTE — Assessment & Plan Note (Signed)
Continue propranolol 120 mg daily.  ?BP is well controlled off irbesartan and hydralazine.  ?I reinforced the importance of calling us prior to stopping medications.  ?

## 2021-09-29 NOTE — Assessment & Plan Note (Signed)
Check Urine culture. Only mild changes in UA. Await culture results to treat.  ?

## 2021-10-11 ENCOUNTER — Other Ambulatory Visit: Payer: Self-pay | Admitting: Family Medicine

## 2021-10-23 ENCOUNTER — Ambulatory Visit
Admission: RE | Admit: 2021-10-23 | Discharge: 2021-10-23 | Disposition: A | Discharge status: Home / Self Care | Payer: Medicare Other | Source: Ambulatory Visit | Attending: Family Medicine | Admitting: Family Medicine

## 2021-10-23 ENCOUNTER — Other Ambulatory Visit: Payer: Self-pay | Admitting: Family Medicine

## 2021-10-23 DIAGNOSIS — Z1231 Encounter for screening mammogram for malignant neoplasm of breast: Secondary | ICD-10-CM | POA: Diagnosis not present

## 2021-12-26 ENCOUNTER — Other Ambulatory Visit: Payer: Self-pay | Admitting: Family Medicine

## 2021-12-29 NOTE — Progress Notes (Deleted)
Subjective:  Patient ID: Misty Evans, female    DOB: December 07, 1950  Age: 71 y.o. MRN: [id number]  No chief complaint on file.   HPI  Hyperlipidemia: Taking Pravastatin 10 mg daily.  Hypertension: She takes Propranolol 120 mg daily, Aspirin 81 mg daily.  Depression: Takes Fluoxetine 10 mg daily.  Bronchitis: Using Anoro ellipta 62.5-25 mcg 1 puff daily, Albuterol nebulizer solution 1 ampule every 6 hours PRN,  Albuterol inhaler 2 puff every 6 hours PRN. Current Outpatient Medications on File Prior to Visit  Medication Sig Dispense Refill   albuterol (ACCUNEB) 1.25 MG/3ML nebulizer solution Take 1 ampule by nebulization every 6 (six) hours as needed for wheezing.     Albuterol Sulfate (PROVENTIL HFA IN) Inhale into the lungs.     aspirin 81 MG chewable tablet Chew 81 mg by mouth daily.     FLUoxetine (PROZAC) 10 MG capsule Take 10 mg by mouth daily.     ibuprofen (ADVIL) 200 MG tablet Take 800 mg by mouth every 6 (six) hours as needed. Takes 4 tablets as needed for back pain     montelukast (SINGULAIR) 10 MG tablet Take 1 tablet (10 mg total) by mouth at bedtime. 30 tablet 3   pravastatin (PRAVACHOL) 10 MG tablet TAKE 1 TABLET BY MOUTH ONCE DAILY 90 tablet 3   propranolol ER (INDERAL LA) 120 MG 24 hr capsule TAKE 1 CAPSULE BY MOUTH  DAILY 90 capsule 3   umeclidinium-vilanterol (ANORO ELLIPTA) 62.5-25 MCG/ACT AEPB Inhale 1 puff into the lungs daily at 6 (six) AM. 1 each 0   No current facility-administered medications on file prior to visit.   Past Medical History:  Diagnosis Date   COPD (chronic obstructive pulmonary disease) ([place])    Depression    GERD (gastroesophageal reflux disease)    Hypertension    Insomnia    Osteoporosis    Renal stones 05/2015   Past Surgical History:  Procedure Laterality Date   ABDOMINAL HYSTERECTOMY     APPENDECTOMY     BACK SURGERY     x2   CHOLECYSTECTOMY     COLONOSCOPY      Family History  Problem Relation Age of Onset    Hypertension Mother    Cancer Father        esophageal   Hypertension Father    Hypertension Maternal Grandmother    Heart attack Maternal Grandmother    Diabetes Paternal Grandmother    Hypertension Paternal Grandmother    Colon cancer Neg Hx    Pancreatic cancer Neg Hx    Esophageal cancer Neg Hx    Rectal cancer Neg Hx    Stomach cancer Neg Hx    Social History   Socioeconomic History   Marital status: Married    Spouse name: Charles   Number of children: 2   Years of education: Not on file   Highest education level: Not on file  Occupational History   Occupation: Retired  Tobacco Use   Smoking status: Former    Packs/day: 0.25    Years: 54.00    Total pack years: 13.50    Types: Cigarettes    Quit date: 03/03/2021    Years since quitting: 0.8   Smokeless tobacco: Never   Tobacco comments:    Has smoked since 71 years old  Vaping Use   Vaping Use: Never used  Substance and Sexual Activity   Alcohol use: Not Currently   Drug use: Never   Sexual activity: Not on file  Other Topics Concern   Not on file  Social History Narrative   Not on file   Social Determinants of Health   Financial Resource Strain: Not on file  Food Insecurity: Not on file  Transportation Needs: Not on file  Physical Activity: Not on file  Stress: Not on file  Social Connections: Not on file    Review of Systems   Objective:  There were no vitals taken for this visit.     09/24/2021    8:01 AM 06/25/2021    9:48 AM 06/25/2021    9:02 AM  BP/Weight  Systolic BP 229 798 921  Diastolic BP 80 90 194  Wt. (Lbs) 99.8  94  BMI 19.49 kg/m2  18.36 kg/m2    Physical Exam  Diabetic Foot Exam - Simple   No data filed      Lab Results  Component Value Date   WBC 7.9 09/24/2021   HGB 16.1 (H) 09/24/2021   HCT 50.1 (H) 09/24/2021   PLT 214 09/24/2021   GLUCOSE 77 09/24/2021   CHOL 176 09/24/2021   TRIG 99 09/24/2021   HDL 62 09/24/2021   LDLCALC 96 09/24/2021   ALT 11  09/24/2021   AST 25 09/24/2021   NA 148 (H) 09/24/2021   K 4.5 09/24/2021   CL 106 09/24/2021   CREATININE 0.94 09/24/2021   BUN 17 09/24/2021   CO2 26 09/24/2021   TSH 1.730 06/25/2021      Assessment & Plan:   Problem List Items Addressed This Visit       Cardiovascular and Mediastinum   Essential hypertension - Primary   Aortic atherosclerosis ([place])     Respiratory   Chronic obstructive bronchitis ([facility])     Other   Mixed hyperlipidemia   Depression, major, recurrent, mild ([place])  .  No orders of the defined types were placed in this encounter.   No orders of the defined types were placed in this encounter.    Follow-up: No follow-ups on file.  An After Visit Summary was printed and given to the patient.  Rochel Brome, MD [facility] [phone]

## 2021-12-30 ENCOUNTER — Ambulatory Visit: Payer: Medicare Other | Admitting: Family Medicine

## 2021-12-30 DIAGNOSIS — E782 Mixed hyperlipidemia: Secondary | ICD-10-CM

## 2021-12-30 DIAGNOSIS — I10 Essential (primary) hypertension: Secondary | ICD-10-CM

## 2021-12-30 DIAGNOSIS — F330 Major depressive disorder, recurrent, mild: Secondary | ICD-10-CM

## 2021-12-30 DIAGNOSIS — I700 Atherosclerosis of aorta: Secondary | ICD-10-CM

## 2021-12-30 DIAGNOSIS — J449 Chronic obstructive pulmonary disease, unspecified: Secondary | ICD-10-CM

## 2022-01-10 ENCOUNTER — Telehealth: Payer: Self-pay

## 2022-01-10 NOTE — Progress Notes (Signed)
    Chronic Care Management Pharmacy Assistant   Name: Misty Evans  MRN: [id number] DOB: 09/20/1950   Reason for Encounter: General Adherence Call   Recent office visits:  09/06/21 Elissa Lovett CMA. Telephone encounter.Insurance does not cover Fluoxetine tablets but will cover the fluoxetine capsules. Pharmacy was given the ok to switch so it would be covered and also the patient's albuterol inhaler was not covered for the ventolin generic but is covered for the Proventil generic.   09/24/21 Rochel Brome MD. Seen for routine visit. Started on Fluoxetine HCI '10mg'$  daily and Anoro Ellipta 62.5-25mcg inhaler. D/C hydralazine '100mg'$ , Irbesartan '300mg'$  and Breztri Inhaler.   Recent consult visits:  None  Hospital visits:  None  Medications: Outpatient Encounter Medications as of 01/10/2022  Medication Sig   albuterol (ACCUNEB) 1.25 MG/3ML nebulizer solution Take 1 ampule by nebulization every 6 (six) hours as needed for wheezing.   Albuterol Sulfate (PROVENTIL HFA IN) Inhale into the lungs.   aspirin 81 MG chewable tablet Chew 81 mg by mouth daily.   FLUoxetine (PROZAC) 10 MG capsule Take 10 mg by mouth daily.   ibuprofen (ADVIL) 200 MG tablet Take 800 mg by mouth every 6 (six) hours as needed. Takes 4 tablets as needed for back pain   montelukast (SINGULAIR) 10 MG tablet Take 1 tablet (10 mg total) by mouth at bedtime.   pravastatin (PRAVACHOL) 10 MG tablet TAKE 1 TABLET BY MOUTH ONCE DAILY   propranolol ER (INDERAL LA) 120 MG 24 hr capsule TAKE 1 CAPSULE BY MOUTH  DAILY   umeclidinium-vilanterol (ANORO ELLIPTA) 62.5-25 MCG/ACT AEPB Inhale 1 puff into the lungs daily at 6 (six) AM.   No facility-administered encounter medications on file as of 01/10/2022.    Contacted [name] for General Review Call   Chart Review:  Have there been any documented new, changed, or discontinued medications since last visit? Yes  Has there been any documented recent  hospitalizations or ED visits since last visit with Clinical Pharmacist? No Brief Summary (including medication and/or Diagnosis changes):None   Adherence Review:  Does the Clinical Pharmacist Assistant have access to adherence rates? Yes Adherence rates for STAR metric medications (List medication(s)/day supply/ last 2 fill dates). Pravastatin 07/24/21 100ds Adherence rates for medications indicated for disease state being reviewed (List medication(s)/day supply/ last 2 fill dates). Does the patient have >5 day gap between last estimated fill dates for any of the above medications or other medication gaps? Yes Reason for medication gaps. Unable to reach pt    Disease State Questions:  Able to connect with Patient? No   Elray Mcgregor, [facility] Catering manager  [phone]

## 2022-01-14 ENCOUNTER — Other Ambulatory Visit: Payer: Self-pay

## 2022-01-14 MED ORDER — FLUOXETINE HCL 10 MG PO CAPS: 10.0000 mg | ORAL_CAPSULE | Freq: Every day | ORAL | 0 refills | Status: DC

## 2022-02-12 ENCOUNTER — Other Ambulatory Visit: Payer: Self-pay | Admitting: Family Medicine

## 2022-02-18 ENCOUNTER — Telehealth: Payer: Self-pay

## 2022-02-18 NOTE — Progress Notes (Signed)
    Chronic Care Management Pharmacy Assistant   Name: Misty Evans  MRN: [id number] DOB: May 28, 1951   Reason for Encounter: Disease State call for HTN   Recent office visits:  None  Recent consult visits:  None  Hospital visits:  None  Medications: Outpatient Encounter Medications as of 02/18/2022  Medication Sig   albuterol (ACCUNEB) 1.25 MG/3ML nebulizer solution Take 1 ampule by nebulization every 6 (six) hours as needed for wheezing.   Albuterol Sulfate (PROVENTIL HFA IN) Inhale into the lungs.   aspirin 81 MG chewable tablet Chew 81 mg by mouth daily.   FLUoxetine (PROZAC) 10 MG capsule Take 1 capsule (10 mg total) by mouth daily.   ibuprofen (ADVIL) 200 MG tablet Take 800 mg by mouth every 6 (six) hours as needed. Takes 4 tablets as needed for back pain   montelukast (SINGULAIR) 10 MG tablet Take 1 tablet (10 mg total) by mouth at bedtime.   pravastatin (PRAVACHOL) 10 MG tablet TAKE 1 TABLET BY MOUTH ONCE DAILY   propranolol ER (INDERAL LA) 120 MG 24 hr capsule TAKE 1 CAPSULE BY MOUTH DAILY   umeclidinium-vilanterol (ANORO ELLIPTA) 62.5-25 MCG/ACT AEPB Inhale 1 puff into the lungs daily at 6 (six) AM.   No facility-administered encounter medications on file as of 02/18/2022.     Recent Office Vitals: BP Readings from Last 3 Encounters:  09/24/21 130/80  06/25/21 136/90  05/06/21 118/72   Pulse Readings from Last 3 Encounters:  09/24/21 67  06/25/21 72  05/06/21 67    Wt Readings from Last 3 Encounters:  09/24/21 99 lb 12.8 oz (45.3 kg)  06/25/21 94 lb (42.6 kg)  05/06/21 88 lb 3.2 oz (40 kg)     Kidney Function Lab Results  Component Value Date/Time   CREATININE 0.94 09/24/2021 09:00 AM   CREATININE 1.07 (H) 06/25/2021 10:17 AM   GFRNONAA 73 02/09/2020 11:30 AM   GFRAA 84 02/09/2020 11:30 AM       Latest Ref Rng & Units 09/24/2021    9:00 AM 06/25/2021   10:17 AM 10/23/2020    2:18 PM  BMP  Glucose 70 - 99 mg/dL 77  79  83   BUN 8 - 27  mg/dL '17  14  17   '$ Creatinine 0.57 - 1.00 mg/dL 0.94  1.07  0.92   BUN/Creat Ratio 12 - '28 18  13  18   '$ Sodium 134 - 144 mmol/L 148  139  141   Potassium 3.5 - 5.2 mmol/L 4.5  4.7  4.3   Chloride 96 - 106 mmol/L 106  101  102   CO2 20 - 29 mmol/L '26  28  23   '$ Calcium 8.7 - 10.3 mg/dL 9.7  9.4  9.8      Current antihypertensive regimen:  Propranolol '120mg'$  daily  Adherence Review: Is the patient currently on ACE/ARB medication? No Does the patient have >5 day gap between last estimated fill dates? CPP to review  Care Gaps: Last annual wellness visit?02/07/21  Star Rating Drugs:  Medication:  Last Fill: Day Supply  None noted    Unable to reach pt after several attempts  Elray Mcgregor, [facility] Pharmacist Assistant  [phone]

## 2022-03-13 ENCOUNTER — Other Ambulatory Visit: Payer: Self-pay | Admitting: Family Medicine

## 2022-03-13 DIAGNOSIS — J432 Centrilobular emphysema: Secondary | ICD-10-CM

## 2022-03-17 ENCOUNTER — Other Ambulatory Visit: Payer: Self-pay | Admitting: Family Medicine

## 2022-04-03 ENCOUNTER — Other Ambulatory Visit: Payer: Self-pay | Admitting: Family Medicine

## 2022-04-04 ENCOUNTER — Ambulatory Visit (INDEPENDENT_AMBULATORY_CARE_PROVIDER_SITE_OTHER): Payer: Medicare Other | Admitting: Family Medicine

## 2022-04-04 ENCOUNTER — Encounter: Payer: Self-pay | Admitting: Family Medicine

## 2022-04-04 VITALS — BP 144/98 | HR 61 | Temp 97.9°F | Ht 60.0 in | Wt 105.0 lb

## 2022-04-04 DIAGNOSIS — Z23 Encounter for immunization: Secondary | ICD-10-CM | POA: Diagnosis not present

## 2022-04-04 DIAGNOSIS — I10 Essential (primary) hypertension: Secondary | ICD-10-CM | POA: Diagnosis not present

## 2022-04-04 DIAGNOSIS — I700 Atherosclerosis of aorta: Secondary | ICD-10-CM | POA: Diagnosis not present

## 2022-04-04 DIAGNOSIS — E782 Mixed hyperlipidemia: Secondary | ICD-10-CM

## 2022-04-04 DIAGNOSIS — J4489 Other specified chronic obstructive pulmonary disease: Secondary | ICD-10-CM

## 2022-04-04 DIAGNOSIS — F330 Major depressive disorder, recurrent, mild: Secondary | ICD-10-CM

## 2022-04-04 DIAGNOSIS — N3001 Acute cystitis with hematuria: Secondary | ICD-10-CM

## 2022-04-04 DIAGNOSIS — R300 Dysuria: Secondary | ICD-10-CM | POA: Diagnosis not present

## 2022-04-04 LAB — POCT URINALYSIS DIPSTICK
Bilirubin, UA: NEGATIVE
Glucose, UA: NEGATIVE
Ketones, UA: NEGATIVE
Nitrite, UA: NEGATIVE
Protein, UA: POSITIVE — AB
Spec Grav, UA: 1.015 (ref 1.010–1.025)
Urobilinogen, UA: NEGATIVE E.U./dL — AB
pH, UA: 6.5 (ref 5.0–8.0)

## 2022-04-04 MED ORDER — IRBESARTAN 75 MG PO TABS: 75.0000 mg | ORAL_TABLET | Freq: Every day | ORAL | 0 refills | Status: DC

## 2022-04-04 MED ORDER — SPIRIVA RESPIMAT 1.25 MCG/ACT IN AERS: 2.0000 | INHALATION_SPRAY | Freq: Every day | RESPIRATORY_TRACT | 0 refills | Status: DC

## 2022-04-04 MED ORDER — NITROFURANTOIN MONOHYD MACRO 100 MG PO CAPS: 100.0000 mg | ORAL_CAPSULE | Freq: Two times a day (BID) | ORAL | 0 refills | Status: DC

## 2022-04-04 MED ORDER — FLUOXETINE HCL 20 MG PO CAPS: 20.0000 mg | ORAL_CAPSULE | Freq: Every day | ORAL | 1 refills | Status: DC

## 2022-04-04 NOTE — Patient Instructions (Addendum)
Depression: Increase prozac 20 mg once daily.   HYPERTENSION: start irbesartan 75 mg daily. Check bp daily.  Call with bp log in 4 weeks.  COPD: worsening. Start spiriva 1.25 mg 2 inhalations daily.

## 2022-04-04 NOTE — Assessment & Plan Note (Signed)
Well controlled.  ?No changes to medicines.  ?Continue to work on eating a healthy diet and exercise.  ?Labs drawn today.  ?

## 2022-04-04 NOTE — Progress Notes (Unsigned)
Subjective:  Patient ID: Misty Evans, female    DOB: 1950/11/29  Age: 71 y.o. MRN: [id number]  Chief Complaint  Patient presents with   Hyperlipidemia   Hypertension    HPI Hyperlipidemia/Aortic atherosclerosis:  Patient is currently taking Pravastatin 10 mg take 1 tablet daily.  Hypertension: Patient passed out in January 2023. LOC for less than a couple of minutes. Patient did not seek medical care. She had been feeling weak and light headed. Patient opted to discontinue irbesartan 300 mg daily and hydralazine 100 mg twice a day. Patient continued propranolol er 120 mg once daily because she was taking this for tremor.  Checks bp 2-3 times per week. Feels like she gets a headache.  130-140/80-90s.   Depression:  Patient is currently on prozac 10 mg once daily. Some days depressed. Her husband had a stroke and she is his main caretaker. Has affected his memory and he falls frequently.   COPD: patient stopped breztri due to hoarseness a couple of months ago. Hoarseness improved, but shortness of breath worsened. Patient has dyspnea on exertion. Has been using albuterol HFA 2 puffs prior to activity. Uses a couple of times per week.    Current Outpatient Medications on File Prior to Visit  Medication Sig Dispense Refill   albuterol (ACCUNEB) 1.25 MG/3ML nebulizer solution Take 1 ampule by nebulization every 6 (six) hours as needed for wheezing.     albuterol (VENTOLIN HFA) 108 (90 Base) MCG/ACT inhaler USE 2 INHALATIONS BY MOUTH EVERY 6 HOURS AS NEEDED FOR WHEEZING  OR SHORTNESS OF BREATH 26.8 g 1   Albuterol Sulfate (PROVENTIL HFA IN) Inhale into the lungs.     aspirin 81 MG chewable tablet Chew 81 mg by mouth daily.     BIOTIN PO Take by mouth.     cetirizine (ZYRTEC) 10 MG tablet Take 10 mg by mouth daily.     Cyanocobalamin (VITAMIN B-12 PO) Take by mouth.     ibuprofen (ADVIL) 200 MG tablet Take 800 mg by mouth every 6 (six) hours as needed. Takes 4 tablets as needed  for back pain     pravastatin (PRAVACHOL) 10 MG tablet TAKE 1 TABLET BY MOUTH ONCE  DAILY 90 tablet 2   propranolol ER (INDERAL LA) 120 MG 24 hr capsule TAKE 1 CAPSULE BY MOUTH DAILY 100 capsule 2   No current facility-administered medications on file prior to visit.   Past Medical History:  Diagnosis Date   COPD (chronic obstructive pulmonary disease) ([place])    Depression    GERD (gastroesophageal reflux disease)    Hypertension    Insomnia    Osteoporosis    Renal stones 05/2015   Past Surgical History:  Procedure Laterality Date   ABDOMINAL HYSTERECTOMY     APPENDECTOMY     BACK SURGERY     x2   CHOLECYSTECTOMY     COLONOSCOPY      Family History  Problem Relation Age of Onset   Hypertension Mother    Cancer Father        esophageal   Hypertension Father    Hypertension Maternal Grandmother    Heart attack Maternal Grandmother    Diabetes Paternal Grandmother    Hypertension Paternal Grandmother    Colon cancer Neg Hx    Pancreatic cancer Neg Hx    Esophageal cancer Neg Hx    Rectal cancer Neg Hx    Stomach cancer Neg Hx    Social History   Socioeconomic History  Marital status: Married    Spouse name: Juanda Crumble   Number of children: 2   Years of education: Not on file   Highest education level: Not on file  Occupational History   Occupation: Retired  Tobacco Use   Smoking status: Former    Packs/day: 0.25    Years: 54.00    Total pack years: 13.50    Types: Cigarettes    Quit date: 03/03/2021    Years since quitting: 1.0   Smokeless tobacco: Never   Tobacco comments:    Has smoked since 71 years old  Vaping Use   Vaping Use: Never used  Substance and Sexual Activity   Alcohol use: Not Currently   Drug use: Never   Sexual activity: Not on file  Other Topics Concern   Not on file  Social History Narrative   Not on file   Social Determinants of Health   Financial Resource Strain: Not on file  Food Insecurity: Not on file  Transportation  Needs: Not on file  Physical Activity: Not on file  Stress: Not on file  Social Connections: Not on file    Review of Systems  Constitutional:  Positive for fatigue. Negative for appetite change and fever.  HENT:  Positive for rhinorrhea. Negative for congestion, ear pain, sinus pressure and sore throat.   Respiratory:  Positive for shortness of breath (Chronic COPD-breathing has gotten worse.). Negative for cough, chest tightness and wheezing.   Cardiovascular:  Negative for chest pain and palpitations.  Gastrointestinal:  Negative for abdominal pain, constipation, diarrhea, nausea and vomiting.  Genitourinary:  Positive for dysuria. Negative for hematuria.       Doesn't feel she is emptying her bladder well   Musculoskeletal:  Positive for back pain (chronic. ibuprofen helps.). Negative for arthralgias and myalgias.  Skin:  Negative for rash.  Neurological:  Negative for dizziness, weakness and headaches.  Psychiatric/Behavioral:  Positive for dysphoric mood. The patient is not nervous/anxious.      Objective:  BP (!) 144/98 (BP Location: Left Arm, Patient Position: Sitting)   Pulse 61   Temp 97.9 F (36.6 C) (Temporal)   Ht 5' (1.524 m)   Wt 105 lb (47.6 kg)   SpO2 91%   BMI 20.51 kg/m      04/04/2022   10:23 AM 04/04/2022    9:59 AM 09/24/2021    8:01 AM  BP/Weight  Systolic BP 660 630 160  Diastolic BP 98 109 80  Wt. (Lbs)  105 99.8  BMI  20.51 kg/m2 19.49 kg/m2    Physical Exam Vitals reviewed.  Constitutional:      Appearance: Normal appearance. She is normal weight.  Neck:     Vascular: No carotid bruit.  Cardiovascular:     Rate and Rhythm: Normal rate and regular rhythm.     Pulses: Normal pulses.     Heart sounds: Normal heart sounds.  Pulmonary:     Effort: Pulmonary effort is normal.     Breath sounds: Normal breath sounds.  Abdominal:     General: Abdomen is flat. Bowel sounds are normal.     Palpations: Abdomen is soft.     Tenderness: There is  no abdominal tenderness.  Neurological:     Mental Status: She is alert and oriented to person, place, and time.  Psychiatric:        Mood and Affect: Mood normal.        Behavior: Behavior normal.     Diabetic Foot Exam -  Simple   No data filed      Lab Results  Component Value Date   WBC 7.9 09/24/2021   HGB 16.1 (H) 09/24/2021   HCT 50.1 (H) 09/24/2021   PLT 214 09/24/2021   GLUCOSE 77 09/24/2021   CHOL 176 09/24/2021   TRIG 99 09/24/2021   HDL 62 09/24/2021   LDLCALC 96 09/24/2021   ALT 11 09/24/2021   AST 25 09/24/2021   NA 148 (H) 09/24/2021   K 4.5 09/24/2021   CL 106 09/24/2021   CREATININE 0.94 09/24/2021   BUN 17 09/24/2021   CO2 26 09/24/2021   TSH 1.730 06/25/2021      Assessment & Plan:   Problem List Items Addressed This Visit       Cardiovascular and Mediastinum   Essential hypertension   Relevant Medications   irbesartan (AVAPRO) 75 MG tablet   Other Relevant Orders   CBC with Differential/Platelet   Comprehensive metabolic panel   Aortic atherosclerosis (HCC)   Relevant Medications   irbesartan (AVAPRO) 75 MG tablet     Other   Mixed hyperlipidemia - Primary    Well controlled.  No changes to medicines.  Continue to work on eating a healthy diet and exercise.  Labs drawn today.        Relevant Medications   irbesartan (AVAPRO) 75 MG tablet   Other Relevant Orders   Lipid panel   Depression, major, recurrent, mild (HCC)   Relevant Medications   FLUoxetine (PROZAC) 20 MG capsule   Other Visit Diagnoses     Need for immunization against influenza       Relevant Orders   Flu Vaccine QUAD High Dose(Fluad) (Completed)     .  Meds ordered this encounter  Medications   Tiotropium Bromide Monohydrate (SPIRIVA RESPIMAT) 1.25 MCG/ACT AERS    Sig: Inhale 2 puffs into the lungs daily.    Dispense:  2 each    Refill:  0   irbesartan (AVAPRO) 75 MG tablet    Sig: Take 1 tablet (75 mg total) by mouth daily.    Dispense:  90  tablet    Refill:  0   FLUoxetine (PROZAC) 20 MG capsule    Sig: Take 1 capsule (20 mg total) by mouth daily.    Dispense:  90 capsule    Refill:  1    Please send a replace/new response with 100-Day Supply if appropriate to maximize member benefit. Requesting 1 year supply.    Orders Placed This Encounter  Procedures   Flu Vaccine QUAD High Dose(Fluad)   CBC with Differential/Platelet   Comprehensive metabolic panel   Lipid panel     Follow-up: Return in about 3 months (around 07/05/2022) for chronic fasting.  An After Visit Summary was printed and given to the patient.   I,Lauren M Auman,acting as a scribe for Rochel Brome, MD.,have documented all relevant documentation on the behalf of Rochel Brome, MD,as directed by  Rochel Brome, MD while in the presence of Rochel Brome, MD.    Rochel Brome, MD [place] [phone]

## 2022-04-05 LAB — CBC WITH DIFFERENTIAL/PLATELET
Basophils Absolute: 0.1 10*3/uL (ref 0.0–0.2)
Basos: 1 %
EOS (ABSOLUTE): 0.3 10*3/uL (ref 0.0–0.4)
Eos: 4 %
Hematocrit: 50.7 % — ABNORMAL HIGH (ref 34.0–46.6)
Hemoglobin: 16.3 g/dL — ABNORMAL HIGH (ref 11.1–15.9)
Immature Grans (Abs): 0.1 10*3/uL (ref 0.0–0.1)
Immature Granulocytes: 1 %
Lymphocytes Absolute: 1.8 10*3/uL (ref 0.7–3.1)
Lymphs: 23 %
MCH: 29.3 pg (ref 26.6–33.0)
MCHC: 32.1 g/dL (ref 31.5–35.7)
MCV: 91 fL (ref 79–97)
Monocytes Absolute: 0.5 10*3/uL (ref 0.1–0.9)
Monocytes: 7 %
Neutrophils Absolute: 4.9 10*3/uL (ref 1.4–7.0)
Neutrophils: 64 %
Platelets: 232 10*3/uL (ref 150–450)
RBC: 5.56 x10E6/uL — ABNORMAL HIGH (ref 3.77–5.28)
RDW: 12.9 % (ref 11.7–15.4)
WBC: 7.6 10*3/uL (ref 3.4–10.8)

## 2022-04-05 LAB — COMPREHENSIVE METABOLIC PANEL
ALT: 7 IU/L (ref 0–32)
AST: 19 IU/L (ref 0–40)
Albumin/Globulin Ratio: 1.5 (ref 1.2–2.2)
Albumin: 4.3 g/dL (ref 3.8–4.8)
Alkaline Phosphatase: 63 IU/L (ref 44–121)
BUN/Creatinine Ratio: 15 (ref 12–28)
BUN: 15 mg/dL (ref 8–27)
Bilirubin Total: 0.6 mg/dL (ref 0.0–1.2)
CO2: 28 mmol/L (ref 20–29)
Calcium: 9.5 mg/dL (ref 8.7–10.3)
Chloride: 101 mmol/L (ref 96–106)
Creatinine, Ser: 1 mg/dL (ref 0.57–1.00)
Globulin, Total: 2.9 g/dL (ref 1.5–4.5)
Glucose: 81 mg/dL (ref 70–99)
Potassium: 4.5 mmol/L (ref 3.5–5.2)
Sodium: 143 mmol/L (ref 134–144)
Total Protein: 7.2 g/dL (ref 6.0–8.5)
eGFR: 60 mL/min/{1.73_m2} (ref >59)

## 2022-04-05 LAB — LIPID PANEL
Chol/HDL Ratio: 3 ratio (ref 0.0–4.4)
Cholesterol, Total: 185 mg/dL (ref 100–199)
HDL: 61 mg/dL (ref >39)
LDL Chol Calc (NIH): 105 mg/dL — ABNORMAL HIGH (ref 0–99)
Triglycerides: 104 mg/dL (ref 0–149)
VLDL Cholesterol Cal: 19 mg/dL (ref 5–40)

## 2022-04-05 LAB — CARDIOVASCULAR RISK ASSESSMENT

## 2022-04-06 DIAGNOSIS — Z23 Encounter for immunization: Secondary | ICD-10-CM | POA: Insufficient documentation

## 2022-04-06 HISTORY — DX: Encounter for immunization: Z23

## 2022-04-06 NOTE — Progress Notes (Signed)
Blood count normal.  Liver function normal.  Kidney function normal.  Cholesterol: LDL little high.

## 2022-04-06 NOTE — Assessment & Plan Note (Signed)
Continue pravastatin 10 mg before bed.

## 2022-04-06 NOTE — Assessment & Plan Note (Signed)
Increase prozac 20 mg once daily.

## 2022-04-06 NOTE — Assessment & Plan Note (Signed)
worsening. Start spiriva 1.25 mg 2 inhalations daily.

## 2022-04-06 NOTE — Assessment & Plan Note (Signed)
Start irbesartan 75 mg daily. Check bp daily. Continue propranolol ER 120 mg daily.  Call with bp log in 4 weeks.

## 2022-04-08 LAB — URINE CULTURE

## 2022-05-20 ENCOUNTER — Telehealth: Payer: Self-pay | Admitting: Family Medicine

## 2022-05-20 NOTE — Telephone Encounter (Signed)
Left message for patient to call back and schedule Medicare Annual Wellness Visit (AWV) either virtually or phone with Misty Evans  Left  my Misty Evans number [phone]   Last AWV 02/07/21 please schedule with Nurse Health Adviser   45 min for awv-i and in office appointments 30 min for awv-s  phone/virtual appointments

## 2022-05-29 ENCOUNTER — Other Ambulatory Visit: Payer: Self-pay | Admitting: Family Medicine

## 2022-05-29 DIAGNOSIS — I10 Essential (primary) hypertension: Secondary | ICD-10-CM

## 2022-05-30 ENCOUNTER — Other Ambulatory Visit: Payer: Self-pay | Admitting: Family Medicine

## 2022-07-07 ENCOUNTER — Ambulatory Visit: Payer: Medicare Other | Admitting: Family Medicine

## 2022-07-25 ENCOUNTER — Other Ambulatory Visit: Payer: Self-pay

## 2022-07-25 DIAGNOSIS — F330 Major depressive disorder, recurrent, mild: Secondary | ICD-10-CM

## 2022-07-25 MED ORDER — FLUOXETINE HCL 20 MG PO CAPS: 20.0000 mg | ORAL_CAPSULE | Freq: Every day | ORAL | 1 refills | Status: DC

## 2022-07-25 NOTE — Telephone Encounter (Signed)
Patient called requesting refill of Prozac be sent to [facility].  Last refilled 04/04/22 #90  Patient is out.

## 2022-07-26 ENCOUNTER — Other Ambulatory Visit: Payer: Self-pay | Admitting: Family Medicine

## 2022-08-17 ENCOUNTER — Other Ambulatory Visit: Payer: Self-pay | Admitting: Family Medicine

## 2022-08-17 DIAGNOSIS — J432 Centrilobular emphysema: Secondary | ICD-10-CM

## 2022-09-11 ENCOUNTER — Other Ambulatory Visit: Payer: Self-pay | Admitting: Family Medicine

## 2022-10-01 ENCOUNTER — Encounter: Payer: Self-pay | Admitting: Family Medicine

## 2022-10-01 ENCOUNTER — Ambulatory Visit (INDEPENDENT_AMBULATORY_CARE_PROVIDER_SITE_OTHER): Payer: Medicare Other | Admitting: Family Medicine

## 2022-10-01 VITALS — BP 134/100 | HR 92 | Temp 96.5°F | Ht 60.0 in | Wt 102.0 lb

## 2022-10-01 DIAGNOSIS — J9601 Acute respiratory failure with hypoxia: Secondary | ICD-10-CM

## 2022-10-01 DIAGNOSIS — E782 Mixed hyperlipidemia: Secondary | ICD-10-CM

## 2022-10-01 DIAGNOSIS — R0602 Shortness of breath: Secondary | ICD-10-CM

## 2022-10-01 DIAGNOSIS — I700 Atherosclerosis of aorta: Secondary | ICD-10-CM | POA: Diagnosis not present

## 2022-10-01 DIAGNOSIS — J439 Emphysema, unspecified: Secondary | ICD-10-CM | POA: Diagnosis not present

## 2022-10-01 DIAGNOSIS — Z1231 Encounter for screening mammogram for malignant neoplasm of breast: Secondary | ICD-10-CM

## 2022-10-01 DIAGNOSIS — I10 Essential (primary) hypertension: Secondary | ICD-10-CM | POA: Diagnosis not present

## 2022-10-01 DIAGNOSIS — Z122 Encounter for screening for malignant neoplasm of respiratory organs: Secondary | ICD-10-CM

## 2022-10-01 DIAGNOSIS — J441 Chronic obstructive pulmonary disease with (acute) exacerbation: Secondary | ICD-10-CM | POA: Diagnosis not present

## 2022-10-01 DIAGNOSIS — F330 Major depressive disorder, recurrent, mild: Secondary | ICD-10-CM

## 2022-10-01 DIAGNOSIS — J432 Centrilobular emphysema: Secondary | ICD-10-CM

## 2022-10-01 MED ORDER — AZITHROMYCIN 250 MG PO TABS
ORAL_TABLET | ORAL | 0 refills | Status: DC
Start: 2022-10-01 — End: 2022-12-17

## 2022-10-01 MED ORDER — TRIAMCINOLONE ACETONIDE 40 MG/ML IJ SUSP
80.0000 mg | Freq: Once | INTRAMUSCULAR | Status: AC
Start: 2022-10-01 — End: 2022-10-01
  Administered 2022-10-01: 80 mg via INTRAMUSCULAR

## 2022-10-01 MED ORDER — TRELEGY ELLIPTA 100-62.5-25 MCG/ACT IN AEPB
1.0000 | INHALATION_SPRAY | Freq: Every day | RESPIRATORY_TRACT | 3 refills | Status: DC
Start: 2022-10-01 — End: 2023-08-06

## 2022-10-01 MED ORDER — CEFTRIAXONE SODIUM 1 G IJ SOLR
1.0000 g | Freq: Once | INTRAMUSCULAR | Status: AC
Start: 2022-10-01 — End: 2022-10-01
  Administered 2022-10-01: 1 g via INTRAMUSCULAR

## 2022-10-01 MED ORDER — PREDNISONE 50 MG PO TABS
50.0000 mg | ORAL_TABLET | Freq: Every day | ORAL | 0 refills | Status: DC
Start: 2022-10-01 — End: 2022-12-17

## 2022-10-01 NOTE — Assessment & Plan Note (Addendum)
The current medical regimen is effective;  continue present plan and medications. Continue irbesartan 75 mg daily.  Continue propranolol ER 120 mg daily.  Check labs.

## 2022-10-01 NOTE — Assessment & Plan Note (Addendum)
Continue pravastatin 10 mg before bed and aspirin 81 mg daily.

## 2022-10-01 NOTE — Assessment & Plan Note (Addendum)
Well controlled.  ?No changes to medicines. Continue pravastatin 10 mg before bed.  ?Continue to work on eating a healthy diet and exercise.  ?Labs drawn today.  ? ?

## 2022-10-01 NOTE — Progress Notes (Signed)
Subjective:  Patient ID: Misty Evans, female    DOB: 06/20/1950  Age: 72 y.o. MRN: [id number]  Chief Complaint  Patient presents with   Medical Management of Chronic Issues   HPI Hyperlipidemia/Aortic atherosclerosis:  Patient is currently taking Pravastatin 10 mg take 1 tablet daily.  Hypertension/Tremor: Patient continued irbesartan 75 mg, propranolol er 120 mg once daily   Depression:  Patient is currently on prozac 10 mg once daily.  COPD:  Patient has dyspnea on exertion. Has been using albuterol HFA 2 puffs prior to activity. Has tried using her husband's O2 which has helped over the past 2 weeks. Trelegy helped the most. Failed breztri and spiriva.  Qualification test for oxygen: At rest without oxygen:  84 At rest with oxygen:  94 Walking without oxygen: 82                           Post walk with oxygen:  96      10/01/2022   11:14 AM 10/01/2022   10:37 AM 04/04/2022   11:31 AM 09/24/2021    8:42 AM 09/24/2021    8:06 AM  Depression screen PHQ 2/9  Decreased Interest 1 0 0  0  Down, Depressed, Hopeless 1 0 1  1  PHQ - 2 Score 2 0 1  1  Altered sleeping 0  1  0  Tired, decreased energy 3  1  1   Change in appetite 1  0  0  Feeling bad or failure about yourself  1  1  0  Trouble concentrating 0  0  1  Moving slowly or fidgety/restless 0  0  0  Suicidal thoughts 0  0  0  PHQ-9 Score 7  4  3   Difficult doing work/chores Somewhat difficult  Somewhat difficult Not difficult at all         10/01/2022   10:37 AM  Fall Risk   Falls in the past year? 0  Number falls in past yr: 0  Injury with Fall? 0  Risk for fall due to : No Fall Risks  Follow up Falls evaluation completed    Patient Care Team: Blane Ohara, MD as PCP - General (Family Medicine) Earvin Hansen, [facility] (Inactive) as Pharmacist (Pharmacist) Lynann Bologna, MD as Consulting Physician (Gastroenterology) Tomma Lightning, MD as Consulting Physician (Pulmonary Disease)   Review of Systems   Constitutional:  Positive for diaphoresis (with [facility]) and fatigue. Negative for chills and fever.  HENT:  Positive for rhinorrhea. Negative for congestion, ear pain and sore throat.   Respiratory:  Positive for shortness of breath. Negative for cough.   Cardiovascular:  Negative for chest pain.  Gastrointestinal:  Negative for abdominal pain, constipation, diarrhea, nausea and vomiting.  Genitourinary:  Negative for dysuria and urgency.  Musculoskeletal:  Positive for back pain. Negative for myalgias.  Neurological:  Negative for dizziness, weakness, light-headedness and headaches.  Psychiatric/Behavioral:  Negative for dysphoric mood. The patient is not nervous/anxious.     Current Outpatient Medications on File Prior to Visit  Medication Sig Dispense Refill   albuterol (ACCUNEB) 1.25 MG/3ML nebulizer solution Take 1 ampule by nebulization every 6 (six) hours as needed for wheezing.     albuterol (VENTOLIN HFA) 108 (90 Base) MCG/ACT inhaler USE 2 INHALATIONS BY MOUTH EVERY 6 HOURS AS NEEDED FOR WHEEZING  OR SHORTNESS OF BREATH 26.8 g 2   Albuterol Sulfate (PROVENTIL HFA IN) Inhale into the lungs.  aspirin 81 MG chewable tablet Chew 81 mg by mouth daily.     Cyanocobalamin (VITAMIN B-12 PO) Take by mouth.     FLUoxetine (PROZAC) 20 MG capsule Take 1 capsule (20 mg total) by mouth daily. 90 capsule 1   ibuprofen (ADVIL) 200 MG tablet Take 800 mg by mouth every 6 (six) hours as needed. Takes 4 tablets as needed for back pain     irbesartan (AVAPRO) 75 MG tablet TAKE 1 TABLET BY MOUTH DAILY 90 tablet 3   pravastatin (PRAVACHOL) 10 MG tablet TAKE 1 TABLET BY MOUTH ONCE  DAILY 100 tablet 0   propranolol ER (INDERAL LA) 120 MG 24 hr capsule TAKE 1 CAPSULE BY MOUTH DAILY 100 capsule 2   No current facility-administered medications on file prior to visit.   Past Medical History:  Diagnosis Date   COPD (chronic obstructive pulmonary disease) (HCC)    Depression    GERD (gastroesophageal  reflux disease)    Hypertension    Insomnia    Osteoporosis    Renal stones 05/2015   Past Surgical History:  Procedure Laterality Date   ABDOMINAL HYSTERECTOMY     APPENDECTOMY     BACK SURGERY     x2   CHOLECYSTECTOMY     COLONOSCOPY      Family History  Problem Relation Age of Onset   Hypertension Mother    Cancer Father        esophageal   Hypertension Father    Hypertension Maternal Grandmother    Heart attack Maternal Grandmother    Diabetes Paternal Grandmother    Hypertension Paternal Grandmother    Colon cancer Neg Hx    Pancreatic cancer Neg Hx    Esophageal cancer Neg Hx    Rectal cancer Neg Hx    Stomach cancer Neg Hx    Social History   Socioeconomic History   Marital status: Married    Spouse name: Charles   Number of children: 2   Years of education: Not on file   Highest education level: Not on file  Occupational History   Occupation: Retired  Tobacco Use   Smoking status: Former    Packs/day: 0.25    Years: 54.00    Additional pack years: 0.00    Total pack years: 13.50    Types: Cigarettes    Quit date: 03/03/2021    Years since quitting: 1.5   Smokeless tobacco: Never   Tobacco comments:    Has smoked since 72 years old  Vaping Use   Vaping Use: Some days  Substance and Sexual Activity   Alcohol use: Not Currently   Drug use: Never   Sexual activity: Not on file  Other Topics Concern   Not on file  Social History Narrative   Not on file   Social Determinants of Health   Financial Resource Strain: Low Risk  (10/01/2022)   Overall Financial Resource Strain (CARDIA)    Difficulty of Paying Living Expenses: Not hard at all  Food Insecurity: No Food Insecurity (10/01/2022)   Hunger Vital Sign    Worried About Running Out of Food in the Last Year: Never true    Ran Out of Food in the Last Year: Never true  Transportation Needs: No Transportation Needs (10/01/2022)   PRAPARE - Administrator, Civil Service (Medical): No     Lack of Transportation (Non-Medical): No  Physical Activity: Inactive (10/01/2022)   Exercise Vital Sign    Days of Exercise per  Week: 0 days    Minutes of Exercise per Session: 0 min  Stress: No Stress Concern Present (10/01/2022)   [employer] of Occupational Health - Occupational Stress Questionnaire    Feeling of Stress : Not at all  Social Connections: Moderately Isolated (10/01/2022)   Social Connection and Isolation Panel [NHANES]    Frequency of Communication with Friends and Family: More than three times a week    Frequency of Social Gatherings with Friends and Family: Once a week    Attends Religious Services: Never    Database administrator or Organizations: No    Attends Engineer, structural: Never    Marital Status: Married    Objective:  BP (!) 134/100   Pulse 92   Temp (!) 96.5 F (35.8 C)   Ht 5' (1.524 m)   Wt 102 lb (46.3 kg)   SpO2 (!) 82% Comment: Without O2. With O2 96%  BMI 19.92 kg/m      10/01/2022   10:27 AM 04/04/2022   10:23 AM 04/04/2022    9:59 AM  BP/Weight  Systolic BP 134 144 150  Diastolic BP 100 98 100  Wt. (Lbs) 102  105  BMI 19.92 kg/m2  20.51 kg/m2    Physical Exam Vitals reviewed.  Constitutional:      Appearance: Normal appearance. She is normal weight.  HENT:     Left Ear: Tympanic membrane normal.     Nose: Nose normal.     Mouth/Throat:     Pharynx: No oropharyngeal exudate or posterior oropharyngeal erythema.  Neck:     Vascular: No carotid bruit.  Cardiovascular:     Rate and Rhythm: Normal rate and regular rhythm.     Heart sounds: Normal heart sounds.  Pulmonary:     Effort: Pulmonary effort is normal. No respiratory distress.     Breath sounds: Wheezing present.  Abdominal:     General: Abdomen is flat. Bowel sounds are normal.     Palpations: Abdomen is soft.     Tenderness: There is no abdominal tenderness.  Lymphadenopathy:     Cervical: No cervical adenopathy.  Neurological:     Mental Status: She  is alert and oriented to person, place, and time.  Psychiatric:        Mood and Affect: Mood normal.        Behavior: Behavior normal.     Diabetic Foot Exam - Simple   No data filed      Lab Results  Component Value Date   WBC 7.4 10/01/2022   HGB 15.6 10/01/2022   HCT 50.0 (H) 10/01/2022   PLT 226 10/01/2022   GLUCOSE 71 10/01/2022   CHOL 167 10/01/2022   TRIG 107 10/01/2022   HDL 60 10/01/2022   LDLCALC 88 10/01/2022   ALT 10 10/01/2022   AST 25 10/01/2022   NA 140 10/01/2022   K 4.2 10/01/2022   CL 98 10/01/2022   CREATININE 0.95 10/01/2022   BUN 14 10/01/2022   CO2 27 10/01/2022   TSH 1.730 10/01/2022      Assessment & Plan:    Mixed hyperlipidemia Assessment & Plan: Well controlled.  No changes to medicines. Continue pravastatin 10 mg before bed.  Continue to work on eating a healthy diet and exercise.  Labs drawn today.  Orders: -     CBC with Differential/Platelet -     Lipid panel  Essential hypertension Assessment & Plan: The current medical regimen is effective;  continue  present plan and medications. Continue irbesartan 75 mg daily.  Continue propranolol ER 120 mg daily.  Check labs.   Orders: -     Comprehensive metabolic panel -     TSH -     Cardiovascular Risk Assessment  Aortic atherosclerosis (HCC) Assessment & Plan: Continue pravastatin 10 mg before bed and aspirin 81 mg daily.    Depression, major, recurrent, mild (HCC) Assessment & Plan: Continue prozac 10 mg daily    Screening for lung cancer Assessment & Plan: Ordered ct of lungs.    Shortness of breath Assessment & Plan: Ordering chest xray.  Ordering CT of lungs.   Orders: -     DG Chest 2 View; Future -     CT CHEST WO CONTRAST; Future  COPD exacerbation (HCC) Assessment & Plan: Order CT of lung wo contrast Chest Xray Duoneb tx in office Gave trelegy sample, Rx sent to mail order Kenalog 80 mg and Rocephin 1 gm given in office Zpak and Prednisone 50  mg x 5 days Walk test for O2 qualification performed Husband gets O2 supplies via [employer]  Orders: -     Trelegy Ellipta; Inhale 1 puff into the lungs daily.  Dispense: 3 each; Refill: 3 -     CT CHEST WO CONTRAST; Future -     Triamcinolone Acetonide -     cefTRIAXone Sodium -     predniSONE; Take 1 tablet (50 mg total) by mouth daily with breakfast.  Dispense: 5 tablet; Refill: 0 -     Azithromycin; 2 DAILY FOR FIRST DAY, THEN DECREASE TO ONE DAILY FOR 4 MORE DAYS.  Dispense: 6 tablet; Refill: 0 -     Ambulatory referral to Pulmonology  Encounter for screening mammogram for malignant neoplasm of breast Assessment & Plan: Order mammogram.  Orders: -     3D Screening Mammogram, Left and Right; Future  Acute respiratory failure with hypoxia (HCC) Assessment & Plan: Abnormal walk test.  Order oxygen 2 L daily.  Orders: -     Ambulatory referral to Pulmonology     Meds ordered this encounter  Medications   Fluticasone-Umeclidin-Vilant (TRELEGY ELLIPTA) 100-62.5-25 MCG/ACT AEPB    Sig: Inhale 1 puff into the lungs daily.    Dispense:  3 each    Refill:  3   triamcinolone acetonide (KENALOG-40) injection 80 mg   cefTRIAXone (ROCEPHIN) injection 1 g   predniSONE (DELTASONE) 50 MG tablet    Sig: Take 1 tablet (50 mg total) by mouth daily with breakfast.    Dispense:  5 tablet    Refill:  0   azithromycin (ZITHROMAX) 250 MG tablet    Sig: 2 DAILY FOR FIRST DAY, THEN DECREASE TO ONE DAILY FOR 4 MORE DAYS.    Dispense:  6 tablet    Refill:  0    Orders Placed This Encounter  Procedures   DG Chest 2 View   CT Chest Wo Contrast   MM 3D SCREENING MAMMOGRAM BILATERAL BREAST   CBC with Differential/Platelet   Comprehensive metabolic panel   Lipid panel   TSH   Cardiovascular Risk Assessment   Ambulatory referral to Pulmonology     Follow-up: Return in about 3 months (around 01/01/2023) for copd/, chronic fasting.   I,Katherina A Bramblett,acting as a scribe for  Blane Ohara, MD.,have documented all relevant documentation on the behalf of Blane Ohara, MD,as directed by  Blane Ohara, MD while in the presence of Blane Ohara, MD.  Clayborn Bigness I Leal-Borjas,acting as  a scribe for Blane Ohara, MD.,have documented all relevant documentation on the behalf of Blane Ohara, MD,as directed by  Blane Ohara, MD while in the presence of Blane Ohara, MD.    An After Visit Summary was printed and given to the patient.  I attest that I have reviewed this visit and agree with the plan scribed by my staff.   Blane Ohara, MD [facility] [phone]

## 2022-10-01 NOTE — Assessment & Plan Note (Addendum)
Continue prozac 10 mg daily

## 2022-10-01 NOTE — Assessment & Plan Note (Addendum)
Order CT of lung wo contrast Chest Xray Duoneb tx in office Gave trelegy sample, Rx sent to mail order Kenalog 80 mg and Rocephin 1 gm given in office Zpak and Prednisone 50 mg x 5 days Walk test for O2 qualification performed Husband gets O2 supplies via [employer]

## 2022-10-01 NOTE — Patient Instructions (Addendum)
Discuss with Pharmacist about RSV, Covid and Shingrix vaccines   COPD exacerbation: Start on Trelegy 1 inhalation daily.  Use albuterol inhaler or nebulizer 4 times daily x 2 days, then go to 4 times daily as needed shortness of breath or wheezing. Prednisone sent. Zithromax sent. Given Kenalog 80 mg and Rocephin 1 g  Go to [facility] to get chest xray.    Hypoxia (low oxygen level) Needs continuous O2 at 2 L.  Will order through [employer].  Ordering CT of lungs. We will call you with appt time.

## 2022-10-02 LAB — COMPREHENSIVE METABOLIC PANEL
ALT: 10 IU/L (ref 0–32)
AST: 25 IU/L (ref 0–40)
Albumin/Globulin Ratio: 1.5 (ref 1.2–2.2)
Albumin: 4.3 g/dL (ref 3.8–4.8)
Alkaline Phosphatase: 76 IU/L (ref 44–121)
BUN/Creatinine Ratio: 15 (ref 12–28)
BUN: 14 mg/dL (ref 8–27)
Bilirubin Total: 1 mg/dL (ref 0.0–1.2)
CO2: 27 mmol/L (ref 20–29)
Calcium: 9.3 mg/dL (ref 8.7–10.3)
Chloride: 98 mmol/L (ref 96–106)
Creatinine, Ser: 0.95 mg/dL (ref 0.57–1.00)
Globulin, Total: 2.9 g/dL (ref 1.5–4.5)
Glucose: 71 mg/dL (ref 70–99)
Potassium: 4.2 mmol/L (ref 3.5–5.2)
Sodium: 140 mmol/L (ref 134–144)
Total Protein: 7.2 g/dL (ref 6.0–8.5)
eGFR: 63 mL/min/{1.73_m2} (ref >59)

## 2022-10-02 LAB — CBC WITH DIFFERENTIAL/PLATELET
Basophils Absolute: 0.1 10*3/uL (ref 0.0–0.2)
Basos: 1 %
EOS (ABSOLUTE): 0.1 10*3/uL (ref 0.0–0.4)
Eos: 2 %
Hematocrit: 50 % — ABNORMAL HIGH (ref 34.0–46.6)
Hemoglobin: 15.6 g/dL (ref 11.1–15.9)
Immature Grans (Abs): 0.1 10*3/uL (ref 0.0–0.1)
Immature Granulocytes: 1 %
Lymphocytes Absolute: 1.4 10*3/uL (ref 0.7–3.1)
Lymphs: 19 %
MCH: 28.8 pg (ref 26.6–33.0)
MCHC: 31.2 g/dL — ABNORMAL LOW (ref 31.5–35.7)
MCV: 92 fL (ref 79–97)
Monocytes Absolute: 0.5 10*3/uL (ref 0.1–0.9)
Monocytes: 7 %
Neutrophils Absolute: 5.3 10*3/uL (ref 1.4–7.0)
Neutrophils: 70 %
Platelets: 226 10*3/uL (ref 150–450)
RBC: 5.41 x10E6/uL — ABNORMAL HIGH (ref 3.77–5.28)
RDW: 13.5 % (ref 11.7–15.4)
WBC: 7.4 10*3/uL (ref 3.4–10.8)

## 2022-10-02 LAB — LIPID PANEL
Chol/HDL Ratio: 2.8 ratio (ref 0.0–4.4)
Cholesterol, Total: 167 mg/dL (ref 100–199)
HDL: 60 mg/dL (ref >39)
LDL Chol Calc (NIH): 88 mg/dL (ref 0–99)
Triglycerides: 107 mg/dL (ref 0–149)
VLDL Cholesterol Cal: 19 mg/dL (ref 5–40)

## 2022-10-02 LAB — CARDIOVASCULAR RISK ASSESSMENT

## 2022-10-02 LAB — TSH: TSH: 1.73 u[IU]/mL (ref 0.450–4.500)

## 2022-10-05 DIAGNOSIS — Z1231 Encounter for screening mammogram for malignant neoplasm of breast: Secondary | ICD-10-CM

## 2022-10-05 DIAGNOSIS — Z122 Encounter for screening for malignant neoplasm of respiratory organs: Secondary | ICD-10-CM

## 2022-10-05 DIAGNOSIS — J9601 Acute respiratory failure with hypoxia: Secondary | ICD-10-CM | POA: Insufficient documentation

## 2022-10-05 DIAGNOSIS — Z0000 Encounter for general adult medical examination without abnormal findings: Secondary | ICD-10-CM | POA: Insufficient documentation

## 2022-10-05 DIAGNOSIS — R0602 Shortness of breath: Secondary | ICD-10-CM | POA: Insufficient documentation

## 2022-10-05 HISTORY — DX: Acute respiratory failure with hypoxia: J96.01

## 2022-10-05 HISTORY — DX: Encounter for screening mammogram for malignant neoplasm of breast: Z12.31

## 2022-10-05 HISTORY — DX: Shortness of breath: R06.02

## 2022-10-05 HISTORY — DX: Encounter for screening for malignant neoplasm of respiratory organs: Z12.2

## 2022-10-05 NOTE — Assessment & Plan Note (Signed)
Ordering chest xray.  Ordering CT of lungs.

## 2022-10-05 NOTE — Assessment & Plan Note (Signed)
Order mammogram °

## 2022-10-05 NOTE — Assessment & Plan Note (Deleted)
Ordered chest xray.  Ordering CT of lungs. ]

## 2022-10-05 NOTE — Assessment & Plan Note (Signed)
Abnormal walk test.  Order oxygen 2 L daily.

## 2022-10-05 NOTE — Assessment & Plan Note (Signed)
Ordered ct of lungs.

## 2022-10-08 ENCOUNTER — Other Ambulatory Visit: Payer: Self-pay

## 2022-10-08 DIAGNOSIS — R0602 Shortness of breath: Secondary | ICD-10-CM

## 2022-10-08 DIAGNOSIS — J439 Emphysema, unspecified: Secondary | ICD-10-CM | POA: Diagnosis not present

## 2022-10-08 DIAGNOSIS — J441 Chronic obstructive pulmonary disease with (acute) exacerbation: Secondary | ICD-10-CM | POA: Diagnosis not present

## 2022-10-08 DIAGNOSIS — J449 Chronic obstructive pulmonary disease, unspecified: Secondary | ICD-10-CM | POA: Diagnosis not present

## 2022-10-08 DIAGNOSIS — R911 Solitary pulmonary nodule: Secondary | ICD-10-CM | POA: Diagnosis not present

## 2022-10-08 DIAGNOSIS — R062 Wheezing: Secondary | ICD-10-CM | POA: Diagnosis not present

## 2022-10-13 ENCOUNTER — Encounter: Payer: Self-pay | Admitting: Family Medicine

## 2022-10-13 ENCOUNTER — Telehealth: Payer: Self-pay

## 2022-10-13 NOTE — Telephone Encounter (Signed)
Erskine Squibb from [facility] called regarding the Chest CT Misty Evans had done on 10/08/22 -- This is scanned in to her chart -- She is expressing importance to #1 under Impression:

## 2022-10-14 ENCOUNTER — Other Ambulatory Visit: Payer: Self-pay

## 2022-10-14 DIAGNOSIS — I7122 Aneurysm of the aortic arch, without rupture: Secondary | ICD-10-CM

## 2022-10-14 NOTE — Telephone Encounter (Signed)
Left message for patient to return call back.  

## 2022-10-14 NOTE — Telephone Encounter (Signed)
Patient informed and wants the referral to vascular. Putting in referral.

## 2022-10-20 ENCOUNTER — Other Ambulatory Visit: Payer: Self-pay

## 2022-10-20 DIAGNOSIS — I7122 Aneurysm of the aortic arch, without rupture: Secondary | ICD-10-CM

## 2022-11-05 ENCOUNTER — Ambulatory Visit
Admission: RE | Admit: 2022-11-05 | Discharge: 2022-11-05 | Disposition: A | Discharge status: Home / Self Care | Payer: Medicare Other | Source: Ambulatory Visit | Attending: Family Medicine | Admitting: Family Medicine

## 2022-11-05 DIAGNOSIS — Z1231 Encounter for screening mammogram for malignant neoplasm of breast: Secondary | ICD-10-CM | POA: Diagnosis not present

## 2022-11-08 DIAGNOSIS — J449 Chronic obstructive pulmonary disease, unspecified: Secondary | ICD-10-CM | POA: Diagnosis not present

## 2022-11-11 ENCOUNTER — Encounter: Payer: Self-pay | Admitting: Family Medicine

## 2022-11-13 ENCOUNTER — Other Ambulatory Visit: Payer: Self-pay

## 2022-11-13 DIAGNOSIS — G4700 Insomnia, unspecified: Secondary | ICD-10-CM | POA: Insufficient documentation

## 2022-11-13 DIAGNOSIS — F32A Depression, unspecified: Secondary | ICD-10-CM | POA: Insufficient documentation

## 2022-11-13 DIAGNOSIS — K219 Gastro-esophageal reflux disease without esophagitis: Secondary | ICD-10-CM | POA: Insufficient documentation

## 2022-11-13 DIAGNOSIS — J449 Chronic obstructive pulmonary disease, unspecified: Secondary | ICD-10-CM | POA: Insufficient documentation

## 2022-11-13 DIAGNOSIS — M810 Age-related osteoporosis without current pathological fracture: Secondary | ICD-10-CM | POA: Insufficient documentation

## 2022-11-20 ENCOUNTER — Other Ambulatory Visit: Payer: Self-pay | Admitting: Family Medicine

## 2022-11-26 ENCOUNTER — Ambulatory Visit: Payer: Medicare Other | Attending: Cardiology | Admitting: Cardiology

## 2022-11-26 ENCOUNTER — Encounter: Payer: Self-pay | Admitting: Cardiology

## 2022-11-26 VITALS — BP 182/118 | HR 60 | Ht 60.0 in | Wt 101.4 lb

## 2022-11-26 DIAGNOSIS — I10 Essential (primary) hypertension: Secondary | ICD-10-CM

## 2022-11-26 DIAGNOSIS — I719 Aortic aneurysm of unspecified site, without rupture: Secondary | ICD-10-CM

## 2022-11-26 DIAGNOSIS — Z87891 Personal history of nicotine dependence: Secondary | ICD-10-CM

## 2022-11-26 DIAGNOSIS — J431 Panlobular emphysema: Secondary | ICD-10-CM

## 2022-11-26 DIAGNOSIS — I209 Angina pectoris, unspecified: Secondary | ICD-10-CM

## 2022-11-26 DIAGNOSIS — E782 Mixed hyperlipidemia: Secondary | ICD-10-CM | POA: Diagnosis not present

## 2022-11-26 DIAGNOSIS — I700 Atherosclerosis of aorta: Secondary | ICD-10-CM

## 2022-11-26 HISTORY — DX: Personal history of nicotine dependence: Z87.891

## 2022-11-26 HISTORY — DX: Aortic aneurysm of unspecified site, without rupture: I71.9

## 2022-11-26 MED ORDER — ALPRAZOLAM 0.25 MG PO TABS: 0.1250 mg | ORAL_TABLET | Freq: Every evening | ORAL | 0 refills | Status: DC | PRN

## 2022-11-26 MED ORDER — NITROGLYCERIN 0.4 MG SL SUBL: 0.4000 mg | SUBLINGUAL_TABLET | SUBLINGUAL | 6 refills | Status: DC | PRN

## 2022-11-26 NOTE — H&P (View-Only) (Signed)
Cardiology Office Note:    Date:  11/26/2022   ID:  Misty Evans, DOB 1950/08/15, MRN [id number]  PCP:  Blane Ohara, MD  Cardiologist:  Garwin Brothers, MD   Referring MD: Blane Ohara, MD    ASSESSMENT:    1. Essential hypertension   2. Aortic atherosclerosis (HCC)   3. Panlobular emphysema (HCC)   4. Mixed dyslipidemia   5. Ex-smoker   6. Aortic aneurysm without rupture, unspecified portion of aorta (HCC)    PLAN:    In order of problems listed above:  Angina pectoris: Patient's symptoms are very concerning.  She was advised to continue taking a coated aspirin on a daily basis.  Sublingual nitroglycerin prescription was sent, its protocol and 911 protocol explained and the patient vocalized understanding questions were answered to the patient's satisfaction.I discussed coronary angiography and left heart catheterization with the patient at extensive length. Procedure, benefits and potential risks were explained. Patient had multiple questions which were answered to the patient's satisfaction. Patient agreed and consented for the procedure. Further recommendations will be made based on the findings of the coronary angiography. In the interim. The patient has any significant symptoms he knows to go to the nearest emergency room.  I also gave her an option in view of significant episodes of chest pain to go to the emergency room but she and her family are vehemently against it and I respect her wishes. Essential hypertension: Blood pressure stable and diet was emphasized.  She appears very anxious today.  Her blood pressure at home are in the range of 130/70. Focal aortic aneurysm: As mentioned below.  CT scan is complete details.  I think it would benefit the patient to get a aortography to better define this.  I will leave this to the discretion of the interventional cardiologist to evaluate. Ex-smoker: Quit recently.  Advised never to go back to smoking.  She will be seen in  follow-up appointment after the coronary angiography. She has been given a cardiothoracic consultation by her primary care.  It would be good if those specialists can see her while she is in the hospital.  Patient her husband and daughter had multiple questions which were answered to their satisfaction. I gave her a prescription for Xanax 0.125 mg to be used on a as needed basis.  10 tablets with no refills were given and precautions were explained.   Medication Adjustments/Labs and Tests Ordered: Current medicines are reviewed at length with the patient today.  Concerns regarding medicines are outlined above.  Orders Placed This Encounter  Procedures   EKG 12-Lead   No orders of the defined types were placed in this encounter.    History of Present Illness:    Misty Evans is a 72 y.o. female who is being seen today for the evaluation of angina pectoris, chest pain and aneurysm of the aorta at the request of Cox, Kirsten, MD. patient mentions to me that she was referred here for chest tightness.  She has chest tightness 3-4 times a week.  She has no sublingual nitroglycerin with her.  She has history of essential hypertension, dyslipidemia, coronary artery calcification and recent CT scan of the chest has revealed focal aneurysm in the aorta on the left side past the origin of the subclavian artery.  No tearing chest pain or any such issues.  She appears anxious today.  Her blood pressure is elevated.  At the time of my evaluation, the patient is alert awake oriented  and in no distress.  She has chest tightness with radiation to the neck at times.      Past Medical History:  Diagnosis Date   Abnormal weight loss 01/04/2020   Acute respiratory failure with hypoxia (HCC) 10/05/2022   Allergic rhinitis due to allergen 07/03/2021   Aortic atherosclerosis (HCC) 06/25/2021   COPD (chronic obstructive pulmonary disease) (HCC)    COPD exacerbation (HCC) 11/12/2019   Depression     Depression, major, recurrent, mild (HCC) 11/12/2019   Essential hypertension 11/02/2019   GERD (gastroesophageal reflux disease)    History of nephrolithiasis 02/04/2019   Insomnia    Mild protein-calorie malnutrition (HCC) 11/12/2019   Mixed hyperlipidemia 11/02/2019   Nodule of left lung 01/04/2020   Osteoporosis    Renal stones 05/2015   Shortness of breath 10/05/2022    Past Surgical History:  Procedure Laterality Date   ABDOMINAL HYSTERECTOMY     APPENDECTOMY     BACK SURGERY     x2   CHOLECYSTECTOMY     COLONOSCOPY      Current Medications: Current Meds  Medication Sig   albuterol (ACCUNEB) 1.25 MG/3ML nebulizer solution Take 1 ampule by nebulization every 6 (six) hours as needed for wheezing.   albuterol (VENTOLIN HFA) 108 (90 Base) MCG/ACT inhaler USE 2 INHALATIONS BY MOUTH EVERY 6 HOURS AS NEEDED FOR WHEEZING  OR SHORTNESS OF BREATH   aspirin 81 MG chewable tablet Chew 81 mg by mouth daily.   Cyanocobalamin (VITAMIN B-12 PO) Take 1 tablet by mouth daily.   FLUoxetine (PROZAC) 20 MG capsule Take 1 capsule (20 mg total) by mouth daily.   Fluticasone-Umeclidin-Vilant (TRELEGY ELLIPTA) 100-62.5-25 MCG/ACT AEPB Inhale 1 puff into the lungs daily.   irbesartan (AVAPRO) 75 MG tablet TAKE 1 TABLET BY MOUTH DAILY   pravastatin (PRAVACHOL) 10 MG tablet TAKE 1 TABLET BY MOUTH ONCE  DAILY   propranolol ER (INDERAL LA) 120 MG 24 hr capsule TAKE 1 CAPSULE BY MOUTH DAILY     Allergies:   Penicillin g and Sulfamethoxazole   Social History   Socioeconomic History   Marital status: Married    Spouse name: Charles   Number of children: 2   Years of education: Not on file   Highest education level: Not on file  Occupational History   Occupation: Retired  Tobacco Use   Smoking status: Former    Packs/day: 0.25    Years: 54.00    Additional pack years: 0.00    Total pack years: 13.50    Types: Cigarettes    Quit date: 03/03/2021    Years since quitting: 1.7   Smokeless  tobacco: Never   Tobacco comments:    Has smoked since 72 years old  Vaping Use   Vaping Use: Some days  Substance and Sexual Activity   Alcohol use: Not Currently   Drug use: Never   Sexual activity: Not on file  Other Topics Concern   Not on file  Social History Narrative   Not on file   Social Determinants of Health   Financial Resource Strain: Low Risk  (10/01/2022)   Overall Financial Resource Strain (CARDIA)    Difficulty of Paying Living Expenses: Not hard at all  Food Insecurity: No Food Insecurity (10/01/2022)   Hunger Vital Sign    Worried About Running Out of Food in the Last Year: Never true    Ran Out of Food in the Last Year: Never true  Transportation Needs: No Transportation Needs (10/01/2022)  PRAPARE - Administrator, Civil Service (Medical): No    Lack of Transportation (Non-Medical): No  Physical Activity: Inactive (10/01/2022)   Exercise Vital Sign    Days of Exercise per Week: 0 days    Minutes of Exercise per Session: 0 min  Stress: No Stress Concern Present (10/01/2022)   [employer] of Occupational Health - Occupational Stress Questionnaire    Feeling of Stress : Not at all  Social Connections: Moderately Isolated (10/01/2022)   Social Connection and Isolation Panel [NHANES]    Frequency of Communication with Friends and Family: More than three times a week    Frequency of Social Gatherings with Friends and Family: Once a week    Attends Religious Services: Never    Database administrator or Organizations: No    Attends Engineer, structural: Never    Marital Status: Married     Family History: The patient's family history includes Cancer in her father; Diabetes in her paternal grandmother; Heart attack in her maternal grandmother; Hypertension in her father, maternal grandmother, mother, and paternal grandmother. There is no history of Colon cancer, Pancreatic cancer, Esophageal cancer, Rectal cancer, or Stomach cancer.  ROS:    Please see the history of present illness.    All other systems reviewed and are negative.  EKGs/Labs/Other Studies Reviewed:    The following studies were reviewed today: EKG shows sinus rhythm biatrial enlargement and T wave inversions in lateral leads       Recent Labs: 10/01/2022: ALT 10; BUN 14; Creatinine, Ser 0.95; Hemoglobin 15.6; Platelets 226; Potassium 4.2; Sodium 140; TSH 1.730  Recent Lipid Panel    Component Value Date/Time   CHOL 167 10/01/2022 1147   TRIG 107 10/01/2022 1147   HDL 60 10/01/2022 1147   CHOLHDL 2.8 10/01/2022 1147   LDLCALC 88 10/01/2022 1147    Physical Exam:    VS:  BP (!) 182/118   Pulse 60   Ht 5' (1.524 m)   Wt 101 lb 6.4 oz (46 kg)   SpO2 92%   BMI 19.80 kg/m     Wt Readings from Last 3 Encounters:  11/26/22 101 lb 6.4 oz (46 kg)  10/01/22 102 lb (46.3 kg)  04/04/22 105 lb (47.6 kg)     GEN: Patient is in no acute distress HEENT: Normal NECK: No JVD; No carotid bruits LYMPHATICS: No lymphadenopathy CARDIAC: S1 S2 regular, 2/6 systolic murmur at the apex. RESPIRATORY:  Clear to auscultation without rales, wheezing or rhonchi  ABDOMEN: Soft, non-tender, non-distended MUSCULOSKELETAL:  No edema; No deformity  SKIN: Warm and dry NEUROLOGIC:  Alert and oriented x 3 PSYCHIATRIC:  Normal affect    Signed, Garwin Brothers, MD  11/26/2022 2:32 PM    [facility]

## 2022-11-26 NOTE — Progress Notes (Signed)
Cardiology Office Note:    Date:  11/26/2022   ID:  Misty Evans, DOB 01/24/1951, MRN [id number]  PCP:  Cox, Kirsten, MD  Cardiologist:  Ry Moody R Maleea Camilo, MD   Referring MD: Cox, Kirsten, MD    ASSESSMENT:    1. Essential hypertension   2. Aortic atherosclerosis (HCC)   3. Panlobular emphysema (HCC)   4. Mixed dyslipidemia   5. Ex-smoker   6. Aortic aneurysm without rupture, unspecified portion of aorta (HCC)    PLAN:    In order of problems listed above:  Angina pectoris: Patient's symptoms are very concerning.  She was advised to continue taking a coated aspirin on a daily basis.  Sublingual nitroglycerin prescription was sent, its protocol and 911 protocol explained and the patient vocalized understanding questions were answered to the patient's satisfaction.I discussed coronary angiography and left heart catheterization with the patient at extensive length. Procedure, benefits and potential risks were explained. Patient had multiple questions which were answered to the patient's satisfaction. Patient agreed and consented for the procedure. Further recommendations will be made based on the findings of the coronary angiography. In the interim. The patient has any significant symptoms he knows to go to the nearest emergency room.  I also gave her an option in view of significant episodes of chest pain to go to the emergency room but she and her family are vehemently against it and I respect her wishes. Essential hypertension: Blood pressure stable and diet was emphasized.  She appears very anxious today.  Her blood pressure at home are in the range of 130/70. Focal aortic aneurysm: As mentioned below.  CT scan is complete details.  I think it would benefit the patient to get a aortography to better define this.  I will leave this to the discretion of the interventional cardiologist to evaluate. Ex-smoker: Quit recently.  Advised never to go back to smoking.  She will be seen in  follow-up appointment after the coronary angiography. She has been given a cardiothoracic consultation by her primary care.  It would be good if those specialists can see her while she is in the hospital.  Patient her husband and daughter had multiple questions which were answered to their satisfaction. I gave her a prescription for Xanax 0.125 mg to be used on a as needed basis.  10 tablets with no refills were given and precautions were explained.   Medication Adjustments/Labs and Tests Ordered: Current medicines are reviewed at length with the patient today.  Concerns regarding medicines are outlined above.  Orders Placed This Encounter  Procedures   EKG 12-Lead   No orders of the defined types were placed in this encounter.    History of Present Illness:    Misty Evans is a 72 y.o. female who is being seen today for the evaluation of angina pectoris, chest pain and aneurysm of the aorta at the request of Cox, Kirsten, MD. patient mentions to me that she was referred here for chest tightness.  She has chest tightness 3-4 times a week.  She has no sublingual nitroglycerin with her.  She has history of essential hypertension, dyslipidemia, coronary artery calcification and recent CT scan of the chest has revealed focal aneurysm in the aorta on the left side past the origin of the subclavian artery.  No tearing chest pain or any such issues.  She appears anxious today.  Her blood pressure is elevated.  At the time of my evaluation, the patient is alert awake oriented   and in no distress.  She has chest tightness with radiation to the neck at times.      Past Medical History:  Diagnosis Date   Abnormal weight loss 01/04/2020   Acute respiratory failure with hypoxia (HCC) 10/05/2022   Allergic rhinitis due to allergen 07/03/2021   Aortic atherosclerosis (HCC) 06/25/2021   COPD (chronic obstructive pulmonary disease) (HCC)    COPD exacerbation (HCC) 11/12/2019   Depression     Depression, major, recurrent, mild (HCC) 11/12/2019   Essential hypertension 11/02/2019   GERD (gastroesophageal reflux disease)    History of nephrolithiasis 02/04/2019   Insomnia    Mild protein-calorie malnutrition (HCC) 11/12/2019   Mixed hyperlipidemia 11/02/2019   Nodule of left lung 01/04/2020   Osteoporosis    Renal stones 05/2015   Shortness of breath 10/05/2022    Past Surgical History:  Procedure Laterality Date   ABDOMINAL HYSTERECTOMY     APPENDECTOMY     BACK SURGERY     x2   CHOLECYSTECTOMY     COLONOSCOPY      Current Medications: Current Meds  Medication Sig   albuterol (ACCUNEB) 1.25 MG/3ML nebulizer solution Take 1 ampule by nebulization every 6 (six) hours as needed for wheezing.   albuterol (VENTOLIN HFA) 108 (90 Base) MCG/ACT inhaler USE 2 INHALATIONS BY MOUTH EVERY 6 HOURS AS NEEDED FOR WHEEZING  OR SHORTNESS OF BREATH   aspirin 81 MG chewable tablet Chew 81 mg by mouth daily.   Cyanocobalamin (VITAMIN B-12 PO) Take 1 tablet by mouth daily.   FLUoxetine (PROZAC) 20 MG capsule Take 1 capsule (20 mg total) by mouth daily.   Fluticasone-Umeclidin-Vilant (TRELEGY ELLIPTA) 100-62.5-25 MCG/ACT AEPB Inhale 1 puff into the lungs daily.   irbesartan (AVAPRO) 75 MG tablet TAKE 1 TABLET BY MOUTH DAILY   pravastatin (PRAVACHOL) 10 MG tablet TAKE 1 TABLET BY MOUTH ONCE  DAILY   propranolol ER (INDERAL LA) 120 MG 24 hr capsule TAKE 1 CAPSULE BY MOUTH DAILY     Allergies:   Penicillin g and Sulfamethoxazole   Social History   Socioeconomic History   Marital status: Married    Spouse name: Charles   Number of children: 2   Years of education: Not on file   Highest education level: Not on file  Occupational History   Occupation: Retired  Tobacco Use   Smoking status: Former    Packs/day: 0.25    Years: 54.00    Additional pack years: 0.00    Total pack years: 13.50    Types: Cigarettes    Quit date: 03/03/2021    Years since quitting: 1.7   Smokeless  tobacco: Never   Tobacco comments:    Has smoked since 72 years old  Vaping Use   Vaping Use: Some days  Substance and Sexual Activity   Alcohol use: Not Currently   Drug use: Never   Sexual activity: Not on file  Other Topics Concern   Not on file  Social History Narrative   Not on file   Social Determinants of Health   Financial Resource Strain: Low Risk  (10/01/2022)   Overall Financial Resource Strain (CARDIA)    Difficulty of Paying Living Expenses: Not hard at all  Food Insecurity: No Food Insecurity (10/01/2022)   Hunger Vital Sign    Worried About Running Out of Food in the Last Year: Never true    Ran Out of Food in the Last Year: Never true  Transportation Needs: No Transportation Needs (10/01/2022)     PRAPARE - Transportation    Lack of Transportation (Medical): No    Lack of Transportation (Non-Medical): No  Physical Activity: Inactive (10/01/2022)   Exercise Vital Sign    Days of Exercise per Week: 0 days    Minutes of Exercise per Session: 0 min  Stress: No Stress Concern Present (10/01/2022)   [employer] - Occupational Stress Questionnaire    Feeling of Stress : Not at all  Social Connections: Moderately Isolated (10/01/2022)   Social Connection and Isolation Panel [NHANES]    Frequency of Communication with Friends and Family: More than three times a week    Frequency of Social Gatherings with Friends and Family: Once a week    Attends Religious Services: Never    Active Member of Clubs or Organizations: No    Attends Club or Organization Meetings: Never    Marital Status: Married     Family History: The patient's family history includes Cancer in her father; Diabetes in her paternal grandmother; Heart attack in her maternal grandmother; Hypertension in her father, maternal grandmother, mother, and paternal grandmother. There is no history of Colon cancer, Pancreatic cancer, Esophageal cancer, Rectal cancer, or Stomach cancer.  ROS:    Please see the history of present illness.    All other systems reviewed and are negative.  EKGs/Labs/Other Studies Reviewed:    The following studies were reviewed today: EKG shows sinus rhythm biatrial enlargement and T wave inversions in lateral leads       Recent Labs: 10/01/2022: ALT 10; BUN 14; Creatinine, Ser 0.95; Hemoglobin 15.6; Platelets 226; Potassium 4.2; Sodium 140; TSH 1.730  Recent Lipid Panel    Component Value Date/Time   CHOL 167 10/01/2022 1147   TRIG 107 10/01/2022 1147   HDL 60 10/01/2022 1147   CHOLHDL 2.8 10/01/2022 1147   LDLCALC 88 10/01/2022 1147    Physical Exam:    VS:  BP (!) 182/118   Pulse 60   Ht 5' (1.524 m)   Wt 101 lb 6.4 oz (46 kg)   SpO2 92%   BMI 19.80 kg/m     Wt Readings from Last 3 Encounters:  11/26/22 101 lb 6.4 oz (46 kg)  10/01/22 102 lb (46.3 kg)  04/04/22 105 lb (47.6 kg)     GEN: Patient is in no acute distress HEENT: Normal NECK: No JVD; No carotid bruits LYMPHATICS: No lymphadenopathy CARDIAC: S1 S2 regular, 2/6 systolic murmur at the apex. RESPIRATORY:  Clear to auscultation without rales, wheezing or rhonchi  ABDOMEN: Soft, non-tender, non-distended MUSCULOSKELETAL:  No edema; No deformity  SKIN: Warm and dry NEUROLOGIC:  Alert and oriented x 3 PSYCHIATRIC:  Normal affect    Signed, Nayleen Janosik R Sailor Hevia, MD  11/26/2022 2:32 PM    [facility]   

## 2022-11-26 NOTE — Patient Instructions (Addendum)
Medication Instructions:  Your physician has recommended you make the following change in your medication:   Take 81 mg coated aspirin daily.  Use nitroglycerin 1 tablet placed under the tongue at the first sign of chest pain or an angina attack. 1 tablet may be used every 5 minutes as needed, for up to 15 minutes. Do not take more than 3 tablets in 15 minutes. If pain persist call 911 or go to the nearest ED.   *If you need a refill on your cardiac medications before your next appointment, please call your pharmacy*   Lab Work: Your physician recommends that you have a BMET and CBC today in the office for your upcoming procedure.  If you have labs (blood work) drawn today and your tests are completely normal, you will receive your results only by: MyChart Message (if you have MyChart) OR A paper copy in the mail If you have any lab test that is abnormal or we need to change your treatment, we will call you to review the results.   Testing/Procedures:  [place] [facility] AT [place] [address] [place] [address] Dept: [phone] Loc: [phone]  Misty Evans  11/26/2022  You are scheduled for a Cardiac Catheterization on Monday, July 1 with Dr. Lance Muss.  1. Please arrive at the [facility] (Main Entrance A) at [facility]: [address] [place], [place] [address] at 7:00 AM (This time is 2 hour(s) before your procedure to ensure your preparation). Free valet parking service is available. You will check in at ADMITTING. The support person will be asked to wait in the waiting room.  It is OK to have someone drop you off and come back when you are ready to be discharged.    Special note: Every effort is made to have your procedure done on time. Please understand that emergencies sometimes delay scheduled procedures.  2. Diet: Do not eat solid foods after midnight.  The  patient may have clear liquids until 5am upon the day of the procedure.  3. Labs: You had labs done today in the office.  4. Medication instructions in preparation for your procedure:   Contrast Allergy: No   Stop taking, Avapro (Irbesartan) Monday, July 1,  On the morning of your procedure, take your Aspirin 81 mg and any morning medicines NOT listed above.  You may use sips of water.  5. Plan to go home the same day, you will only stay overnight if medically necessary. 6. Bring a current list of your medications and current insurance cards. 7. You MUST have a responsible person to drive you home. 8. Someone MUST be with you the first 24 hours after you arrive home or your discharge will be delayed. 9. Please wear clothes that are easy to get on and off and wear slip-on shoes.  Thank you for allowing [place] to care for you!   -- [facility]    Follow-Up: At [facility], you and your health needs are our priority.  As part of our continuing mission to provide you with exceptional heart care, we have created designated Provider Care Teams.  These Care Teams include your primary Cardiologist (physician) and Advanced Practice Providers (APPs -  Physician Assistants and Nurse Practitioners) who all work together to provide you with the care you need, when you need it.  We recommend signing up for the patient portal called "  MyChart".  Sign up information is provided on this After Visit Summary.  MyChart is used to connect with patients for Virtual Visits (Telemedicine).  Patients are able to view lab/test results, encounter notes, upcoming appointments, etc.  Non-urgent messages can be sent to your provider as well.   To learn more about what you can do with MyChart, go to [email or web address].    Your next appointment:   1 month(s)  The format for your next appointment:   In Person  Provider:   Belva Crome, MD   Other Instructions  Coronary  Angiogram With Stent Coronary angiogram with stent placement is a procedure to widen or open a narrow blood vessel of the heart (coronary artery). Arteries may become blocked by cholesterol buildup (plaques) in the lining of the artery wall. When a coronary artery becomes partially blocked, blood flow to that area decreases. This may lead to chest pain or a heart attack (myocardial infarction). A stent is a small piece of metal that looks like mesh or spring. Stent placement may be done as treatment after a heart attack, or to prevent a heart attack if a blocked artery is found by a coronary angiogram. Let your health care provider know about: Any allergies you have, including allergies to medicines or contrast dye. All medicines you are taking, including vitamins, herbs, eye drops, creams, and over-the-counter medicines. Any problems you or family members have had with anesthetic medicines. Any blood disorders you have. Any surgeries you have had. Any medical conditions you have, including kidney problems or kidney failure. Whether you are pregnant or may be pregnant. Whether you are breastfeeding. What are the risks? Generally, this is a safe procedure. However, serious problems may occur, including: Damage to nearby structures or organs, such as the heart, blood vessels, or kidneys. A return of blockage. Bleeding, infection, or bruising at the insertion site. A collection of blood under the skin (hematoma) at the insertion site. A blood clot in another part of the body. Allergic reaction to medicines or dyes. Bleeding into the abdomen (retroperitoneal bleeding). Stroke (rare). Heart attack (rare). What happens before the procedure? Staying hydrated Follow instructions from your health care provider about hydration, which may include: Up to 2 hours before the procedure - you may continue to drink clear liquids, such as water, clear fruit juice, black coffee, and plain tea.    Eating and  drinking restrictions Follow instructions from your health care provider about eating and drinking, which may include: 8 hours before the procedure - stop eating heavy meals or foods, such as meat, fried foods, or fatty foods. 6 hours before the procedure - stop eating light meals or foods, such as toast or cereal. 2 hours before the procedure - stop drinking clear liquids. Medicines Ask your health care provider about: Changing or stopping your regular medicines. This is especially important if you are taking diabetes medicines or blood thinners. Taking medicines such as aspirin and ibuprofen. These medicines can thin your blood. Do not take these medicines unless your health care provider tells you to take them. Generally, aspirin is recommended before a thin tube, called a catheter, is passed through a blood vessel and inserted into the heart (cardiac catheterization). Taking over-the-counter medicines, vitamins, herbs, and supplements. General instructions Do not use any products that contain nicotine or tobacco for at least 4 weeks before the procedure. These products include cigarettes, e-cigarettes, and chewing tobacco. If you need help quitting, ask your health care provider. Plan to have someone  take you home from the hospital or clinic. If you will be going home right after the procedure, plan to have someone with you for 24 hours. You may have tests and imaging procedures. Ask your health care provider: How your insertion site will be marked. Ask which artery will be used for the procedure. What steps will be taken to help prevent infection. These may include: Removing hair at the insertion site. Washing skin with a germ-killing soap. Taking antibiotic medicine. What happens during the procedure? An IV will be inserted into one of your veins. Electrodes may be placed on your chest to monitor your heart rate during the procedure. You will be given one or more of the following: A  medicine to help you relax (sedative). A medicine to numb the area (local anesthetic) for catheter insertion. A small incision will be made for catheter insertion. The catheter will be inserted into an artery using a guide wire. The location may be in your groin, your wrist, or the fold of your arm (near your elbow). An X-ray procedure (fluoroscopy) will be used to help guide the catheter to the opening of the heart arteries. A dye will be injected into the catheter. X-rays will be taken. The dye helps to show where any narrowing or blockages are located in the arteries. Tell your health care provider if you have chest pain or trouble breathing. A tiny wire will be guided to the blocked spot, and a balloon will be inflated to make the artery wider. The stent will be expanded to crush the plaques into the wall of the vessel. The stent will hold the area open and improve the blood flow. Most stents have a drug coating to reduce the risk of the stent narrowing over time. The artery may be made wider using a drill, laser, or other tools that remove plaques. The catheter will be removed when the blood flow improves. The stent will stay where it was placed, and the lining of the artery will grow over it. A bandage (dressing) will be placed on the insertion site. Pressure will be applied to stop bleeding. The IV will be removed. This procedure may vary among health care providers and hospitals.    What happens after the procedure? Your blood pressure, heart rate, breathing rate, and blood oxygen level will be monitored until you leave the hospital or clinic. If the procedure is done through the leg, you will lie flat in bed for a few hours or for as long as told by your health care provider. You will be instructed not to bend or cross your legs. The insertion site and the pulse in your foot or wrist will be checked often. You may have more blood tests, X-rays, and a test that records the electrical  activity of your heart (electrocardiogram, or ECG). Do not drive for 24 hours if you were given a sedative during your procedure. Summary Coronary angiogram with stent placement is a procedure to widen or open a narrowed coronary artery. This is done to treat heart problems. Before the procedure, let your health care provider know about all the medical conditions and surgeries you have or have had. This is a safe procedure. However, some problems may occur, including damage to nearby structures or organs, bleeding, blood clots, or allergies. Follow your health care provider's instructions about eating, drinking, medicines, and other lifestyle changes, such as quitting tobacco use before the procedure. This information is not intended to replace advice given to you  by your health care provider. Make sure you discuss any questions you have with your health care provider. Document Revised: 12/08/2018 Document Reviewed: 12/08/2018 Elsevier Patient Education  2021 Elsevier Inc.  Aspirin and Your Heart Aspirin is a medicine that prevents the platelets in your blood from sticking together. Platelets are the cells that your blood uses for clotting. Aspirin can be used to help reduce the risk of blood clots, heart attacks, and other heart-related problems. What are the risks? Daily use of aspirin can cause side effects. Some of these include: Bleeding. Bleeding can be minor or serious. An example of minor bleeding is bleeding from a cut, and the bleeding does not stop. An example of more serious bleeding is stomach bleeding or, rarely, bleeding into the brain. Your risk of bleeding increases if you are also taking NSAIDs, such as ibuprofen. Increased bruising. Upset stomach. An allergic reaction. People who have growths inside the nose (nasal polyps) have an increased risk of developing an aspirin allergy. How to use aspirin to care for your heart Take aspirin only as told by your health care provider.  Make sure that you understand how much to take and what form to take. The two forms of aspirin are: Non-enteric-coated.This type of aspirin does not have a coating and is absorbed quickly. This type of aspirin also comes in a chewable form. Enteric-coated. This type of aspirin has a coating that releases the medicine very slowly. Enteric-coated aspirin might cause less stomach upset than non-enteric-coated aspirin. This type of aspirin should not be chewed or crushed. Work with your health care provider to find out whether it is safe and beneficial for you to take aspirin daily. Taking aspirin daily may be helpful if: You have had a heart attack or chest pain, or you are at risk for a heart attack. You have a condition in which certain heart vessels are blocked (coronary artery disease), and you have had a procedure to treat it. Examples are: Open-heart surgery, such as coronary artery bypass surgery (CABG). Coronary angioplasty,which is done to widen a blood vessel of your heart. Having a small mesh tube, or stent, placed in your coronary artery. You have had certain types of stroke or a mini-stroke known as a transient ischemic attack (TIA). You have a narrowing of the arteries that supply the limbs (peripheral artery disease, or PAD). You have long-term (chronic) heart rhythm problems, such as atrial fibrillation, and your health care provider thinks aspirin may help. You have valve disease or have had surgery on a valve. You are considered at increased risk of developing coronary artery disease or PAD.    Follow these instructions at home Medicines Take over-the-counter and prescription medicines only as told by your health care provider. If you are taking blood thinners: Talk with your health care provider before you take any medicines that contain aspirin or NSAIDs, such as ibuprofen. These medicines increase your risk for dangerous bleeding. Take your medicine exactly as told, at the same  time every day. Avoid activities that could cause injury or bruising, and follow instructions about how to prevent falls. Wear a medical alert bracelet or carry a card that lists what medicines you take. General instructions Do not drink alcohol if: Your health care provider tells you not to drink. You are pregnant, may be pregnant, or are planning to become pregnant. If you drink alcohol: Limit how much you use to: 0-1 drink a day for women. 0-2 drinks a day for men. Be aware  of how much alcohol is in your drink. In the [place] one drink equals one 12 oz bottle of beer (355 mL), one 5 oz glass of wine (148 mL), or one 1 oz glass of hard liquor (44 mL). Keep all follow-up visits as told by your health care provider. This is important. Where to find more information The American Heart Association: www.heart.org Contact a health care provider if you have: Unusual bleeding or bruising. Stomach pain or nausea. Ringing in your ears. An allergic reaction that causes hives, itchy skin, or swelling of the lips, tongue, or face. Get help right away if: You notice that your bowel movements are bloody, or dark red or black in color. You vomit or cough up blood. You have blood in your urine. You cough, breathe loudly (wheeze), or feel short of breath. You have chest pain, especially if the pain spreads to your arms, back, neck, or jaw. You have a headache with confusion. You have any symptoms of a stroke. "BE FAST" is an easy way to remember the main warning signs of a stroke: B - Balance. Signs are dizziness, sudden trouble walking, or loss of balance. E - Eyes. Signs are trouble seeing or a sudden change in vision. F - Face. Signs are sudden weakness or numbness of the face, or the face or eyelid drooping on one side. A - Arms. Signs are weakness or numbness in an arm. This happens suddenly and usually on one side of the body. S - Speech. Signs are sudden trouble speaking, slurred speech, or  trouble understanding what people say. T - Time. Time to call emergency services. Write down what time symptoms started. You have other signs of a stroke, such as: A sudden, severe headache with no known cause. Nausea or vomiting. Seizure. These symptoms may represent a serious problem that is an emergency. Do not wait to see if the symptoms will go away. Get medical help right away. Call your local emergency services (911 in the [place] Do not drive yourself to the hospital. Summary Aspirin use can help reduce the risk of blood clots, heart attacks, and other heart-related problems. Daily use of aspirin can cause side effects. Take aspirin only as told by your health care provider. Make sure that you understand how much to take and what form to take. Your health care provider will help you determine whether it is safe and beneficial for you to take aspirin daily. This information is not intended to replace advice given to you by your health care provider. Make sure you discuss any questions you have with your health care provider. Document Revised: 02/21/2019 Document Reviewed: 02/21/2019 Elsevier Patient Education  2021 Elsevier Inc. Nitroglycerin sublingual tablets What is this medicine? NITROGLYCERIN (nye troe GLI ser in) is a type of vasodilator. It relaxes blood vessels, increasing the blood and oxygen supply to your heart. This medicine is used to relieve chest pain caused by angina. It is also used to prevent chest pain before activities like climbing stairs, going outdoors in cold weather, or sexual activity. This medicine may be used for other purposes; ask your health care provider or pharmacist if you have questions. COMMON BRAND NAME(S): Nitroquick, Nitrostat, Nitrotab What should I tell my health care provider before I take this medicine? They need to know if you have any of these conditions: anemia head injury, recent stroke, or bleeding in the brain liver disease previous  heart attack an unusual or allergic reaction to nitroglycerin, other medicines, foods, dyes,  or preservatives pregnant or trying to get pregnant breast-feeding How should I use this medicine? Take this medicine by mouth as needed. Use at the first sign of an angina attack (chest pain or tightness). You can also take this medicine 5 to 10 minutes before an event likely to produce chest pain. Follow the directions exactly as written on the prescription label. Place one tablet under your tongue and let it dissolve. Do not swallow whole. Replace the dose if you accidentally swallow it. It will help if your mouth is not dry. Saliva around the tablet will help it to dissolve more quickly. Do not eat or drink, smoke or chew tobacco while a tablet is dissolving. Sit down when taking this medicine. In an angina attack, you should feel better within 5 minutes after your first dose. You can take a dose every 5 minutes up to a total of 3 doses. If you do not feel better or feel worse after 1 dose, call 9-1-1 at once. Do not take more than 3 doses in 15 minutes. Your health care provider might give you other directions. Follow those directions if he or she does. Do not take your medicine more often than directed. Talk to your health care provider about the use of this medicine in children. Special care may be needed. Overdosage: If you think you have taken too much of this medicine contact a poison control center or emergency room at once. NOTE: This medicine is only for you. Do not share this medicine with others. What if I miss a dose? This does not apply. This medicine is only used as needed. What may interact with this medicine? Do not take this medicine with any of the following medications: certain migraine medicines like ergotamine and dihydroergotamine (DHE) medicines used to treat erectile dysfunction like sildenafil, tadalafil, and vardenafil riociguat This medicine may also interact with the following  medications: alteplase aspirin heparin medicines for high blood pressure medicines for mental depression other medicines used to treat angina phenothiazines like chlorpromazine, mesoridazine, prochlorperazine, thioridazine This list may not describe all possible interactions. Give your health care provider a list of all the medicines, herbs, non-prescription drugs, or dietary supplements you use. Also tell them if you smoke, drink alcohol, or use illegal drugs. Some items may interact with your medicine. What should I watch for while using this medicine? Tell your doctor or health care professional if you feel your medicine is no longer working. Keep this medicine with you at all times. Sit or lie down when you take your medicine to prevent falling if you feel dizzy or faint after using it. Try to remain calm. This will help you to feel better faster. If you feel dizzy, take several deep breaths and lie down with your feet propped up, or bend forward with your head resting between your knees. You may get drowsy or dizzy. Do not drive, use machinery, or do anything that needs mental alertness until you know how this drug affects you. Do not stand or sit up quickly, especially if you are an older patient. This reduces the risk of dizzy or fainting spells. Alcohol can make you more drowsy and dizzy. Avoid alcoholic drinks. Do not treat yourself for coughs, colds, or pain while you are taking this medicine without asking your doctor or health care professional for advice. Some ingredients may increase your blood pressure. What side effects may I notice from receiving this medicine? Side effects that you should report to your doctor or  health care professional as soon as possible: allergic reactions (skin rash, itching or hives; swelling of the face, lips, or tongue) low blood pressure (dizziness; feeling faint or lightheaded, falls; unusually weak or tired) low red blood cell counts (trouble breathing;  feeling faint; lightheaded, falls; unusually weak or tired) Side effects that usually do not require medical attention (report to your doctor or health care professional if they continue or are bothersome): facial flushing (redness) headache nausea, vomiting This list may not describe all possible side effects. Call your doctor for medical advice about side effects. You may report side effects to FDA at 1-800-FDA-1088. Where should I keep my medicine? Keep out of the reach of children. Store at room temperature between 20 and 25 degrees C (68 and 77 degrees F). Store in Retail buyer. Protect from light and moisture. Keep tightly closed. Throw away any unused medicine after the expiration date. NOTE: This sheet is a summary. It may not cover all possible information. If you have questions about this medicine, talk to your doctor, pharmacist, or health care provider.  2021 Elsevier/Gold Standard (2018-02-17 16:46:32)

## 2022-11-27 LAB — BASIC METABOLIC PANEL
BUN/Creatinine Ratio: 15 (ref 12–28)
BUN: 19 mg/dL (ref 8–27)
CO2: 25 mmol/L (ref 20–29)
Calcium: 10.1 mg/dL (ref 8.7–10.3)
Chloride: 100 mmol/L (ref 96–106)
Creatinine, Ser: 1.29 mg/dL — ABNORMAL HIGH (ref 0.57–1.00)
Glucose: 68 mg/dL — ABNORMAL LOW (ref 70–99)
Potassium: 4.8 mmol/L (ref 3.5–5.2)
Sodium: 140 mmol/L (ref 134–144)
eGFR: 44 mL/min/{1.73_m2} — ABNORMAL LOW (ref >59)

## 2022-11-27 LAB — CBC
Hematocrit: 49.4 % — ABNORMAL HIGH (ref 34.0–46.6)
Hemoglobin: 16.1 g/dL — ABNORMAL HIGH (ref 11.1–15.9)
MCH: 29.5 pg (ref 26.6–33.0)
MCHC: 32.6 g/dL (ref 31.5–35.7)
MCV: 91 fL (ref 79–97)
Platelets: 246 10*3/uL (ref 150–450)
RBC: 5.46 x10E6/uL — ABNORMAL HIGH (ref 3.77–5.28)
RDW: 14.2 % (ref 11.7–15.4)
WBC: 10 10*3/uL (ref 3.4–10.8)

## 2022-12-01 ENCOUNTER — Encounter (HOSPITAL_COMMUNITY)
Admission: RE | Disposition: A | Discharge status: Home / Self Care | Payer: Self-pay | Source: Home / Self Care | Attending: Internal Medicine

## 2022-12-01 ENCOUNTER — Ambulatory Visit (HOSPITAL_COMMUNITY)
Admission: RE | Admit: 2022-12-01 | Discharge: 2022-12-01 | Disposition: A | Discharge status: Home / Self Care | Payer: Medicare Other | Attending: Internal Medicine | Admitting: Internal Medicine

## 2022-12-01 DIAGNOSIS — I719 Aortic aneurysm of unspecified site, without rupture: Secondary | ICD-10-CM | POA: Diagnosis not present

## 2022-12-01 DIAGNOSIS — E782 Mixed hyperlipidemia: Secondary | ICD-10-CM | POA: Insufficient documentation

## 2022-12-01 DIAGNOSIS — I25110 Atherosclerotic heart disease of native coronary artery with unstable angina pectoris: Secondary | ICD-10-CM

## 2022-12-01 DIAGNOSIS — Z87891 Personal history of nicotine dependence: Secondary | ICD-10-CM | POA: Insufficient documentation

## 2022-12-01 DIAGNOSIS — J431 Panlobular emphysema: Secondary | ICD-10-CM | POA: Insufficient documentation

## 2022-12-01 DIAGNOSIS — Z7982 Long term (current) use of aspirin: Secondary | ICD-10-CM | POA: Insufficient documentation

## 2022-12-01 DIAGNOSIS — I700 Atherosclerosis of aorta: Secondary | ICD-10-CM | POA: Insufficient documentation

## 2022-12-01 DIAGNOSIS — I10 Essential (primary) hypertension: Secondary | ICD-10-CM

## 2022-12-01 HISTORY — PX: LEFT HEART CATH AND CORONARY ANGIOGRAPHY: CATH118249

## 2022-12-01 SURGERY — LEFT HEART CATH AND CORONARY ANGIOGRAPHY
Anesthesia: LOCAL

## 2022-12-01 MED ORDER — VERAPAMIL HCL 2.5 MG/ML IV SOLN
INTRAVENOUS | Status: DC | PRN
  Administered 2022-12-01: 10 mL via INTRA_ARTERIAL

## 2022-12-01 MED ORDER — SODIUM CHLORIDE 0.9 % IV SOLN: 250.0000 mL | INTRAVENOUS | Status: DC | PRN

## 2022-12-01 MED ORDER — ACETAMINOPHEN 325 MG PO TABS
650.0000 mg | ORAL_TABLET | ORAL | Status: DC | PRN
  Administered 2022-12-01: 650 mg via ORAL
  Filled 2022-12-01: qty 2

## 2022-12-01 MED ORDER — MIDAZOLAM HCL 2 MG/2ML IJ SOLN
INTRAMUSCULAR | Status: AC
  Filled 2022-12-01: qty 2

## 2022-12-01 MED ORDER — HYDRALAZINE HCL 20 MG/ML IJ SOLN: 10.0000 mg | INTRAMUSCULAR | Status: DC | PRN

## 2022-12-01 MED ORDER — ISOSORBIDE MONONITRATE ER 30 MG PO TB24: 15.0000 mg | ORAL_TABLET | Freq: Every day | ORAL | 5 refills | Status: DC

## 2022-12-01 MED ORDER — SODIUM CHLORIDE 0.9% FLUSH: 3.0000 mL | INTRAVENOUS | Status: DC | PRN

## 2022-12-01 MED ORDER — VERAPAMIL HCL 2.5 MG/ML IV SOLN
INTRAVENOUS | Status: AC
  Filled 2022-12-01: qty 2

## 2022-12-01 MED ORDER — SODIUM CHLORIDE 0.9% FLUSH: 3.0000 mL | Freq: Two times a day (BID) | INTRAVENOUS | Status: DC

## 2022-12-01 MED ORDER — HEPARIN SODIUM (PORCINE) 1000 UNIT/ML IJ SOLN
INTRAMUSCULAR | Status: DC | PRN
  Administered 2022-12-01: 2500 [IU] via INTRAVENOUS

## 2022-12-01 MED ORDER — HEPARIN SODIUM (PORCINE) 1000 UNIT/ML IJ SOLN
INTRAMUSCULAR | Status: AC
  Filled 2022-12-01: qty 10

## 2022-12-01 MED ORDER — LIDOCAINE HCL (PF) 1 % IJ SOLN
INTRAMUSCULAR | Status: DC | PRN
  Administered 2022-12-01: 5 mL via INTRADERMAL

## 2022-12-01 MED ORDER — VERAPAMIL HCL 2.5 MG/ML IV SOLN
INTRAVENOUS | Status: DC | PRN
  Administered 2022-12-01: 80 ug via INTRA_ARTERIAL

## 2022-12-01 MED ORDER — SODIUM CHLORIDE 0.9 % IV SOLN: INTRAVENOUS | Status: DC

## 2022-12-01 MED ORDER — FENTANYL CITRATE (PF) 100 MCG/2ML IJ SOLN
INTRAMUSCULAR | Status: DC | PRN
  Administered 2022-12-01: 25 ug via INTRAVENOUS

## 2022-12-01 MED ORDER — HYDRALAZINE HCL 20 MG/ML IJ SOLN
10.0000 mg | Freq: Once | INTRAMUSCULAR | Status: AC
  Administered 2022-12-01: 10 mg via INTRAVENOUS
  Filled 2022-12-01: qty 1

## 2022-12-01 MED ORDER — ASPIRIN 81 MG PO CHEW: 81.0000 mg | CHEWABLE_TABLET | ORAL | Status: DC

## 2022-12-01 MED ORDER — HEPARIN (PORCINE) IN NACL 1000-0.9 UT/500ML-% IV SOLN
INTRAVENOUS | Status: DC | PRN
  Administered 2022-12-01 (×4): 500 mL

## 2022-12-01 MED ORDER — LIDOCAINE HCL (PF) 1 % IJ SOLN
INTRAMUSCULAR | Status: AC
  Filled 2022-12-01: qty 30

## 2022-12-01 MED ORDER — SODIUM CHLORIDE 0.9 % WEIGHT BASED INFUSION
3.0000 mL/kg/h | INTRAVENOUS | Status: AC
  Administered 2022-12-01: 3 mL/kg/h via INTRAVENOUS

## 2022-12-01 MED ORDER — LABETALOL HCL 5 MG/ML IV SOLN: 10.0000 mg | INTRAVENOUS | Status: DC | PRN

## 2022-12-01 MED ORDER — MIDAZOLAM HCL 2 MG/2ML IJ SOLN
INTRAMUSCULAR | Status: DC | PRN
  Administered 2022-12-01: 1 mg via INTRAVENOUS

## 2022-12-01 MED ORDER — ONDANSETRON HCL 4 MG/2ML IJ SOLN: 4.0000 mg | Freq: Four times a day (QID) | INTRAMUSCULAR | Status: DC | PRN

## 2022-12-01 MED ORDER — FENTANYL CITRATE (PF) 100 MCG/2ML IJ SOLN
INTRAMUSCULAR | Status: AC
  Filled 2022-12-01: qty 2

## 2022-12-01 MED ORDER — IOHEXOL 350 MG/ML SOLN
INTRAVENOUS | Status: DC | PRN
  Administered 2022-12-01: 30 mL

## 2022-12-01 MED ORDER — HYDRALAZINE HCL 20 MG/ML IJ SOLN: 10.0000 mg | Freq: Three times a day (TID) | INTRAMUSCULAR | Status: DC | PRN

## 2022-12-01 MED ORDER — SODIUM CHLORIDE 0.9 % WEIGHT BASED INFUSION: 1.0000 mL/kg/h | INTRAVENOUS | Status: DC

## 2022-12-01 SURGICAL SUPPLY — 10 items
CATH 5FR JL3.5 JR4 ANG PIG MP (CATHETERS) IMPLANT
DEVICE RAD TR BAND REGULAR (VASCULAR PRODUCTS) IMPLANT
GLIDESHEATH SLEND SS 6F .021 (SHEATH) IMPLANT
GUIDEWIRE INQWIRE 1.5J.035X260 (WIRE) IMPLANT
INQWIRE 1.5J .035X260CM (WIRE) ×1
KIT HEART LEFT (KITS) ×1 IMPLANT
PACK CARDIAC CATHETERIZATION (CUSTOM PROCEDURE TRAY) ×1 IMPLANT
SHEATH PROBE COVER 6X72 (BAG) IMPLANT
TRANSDUCER W/STOPCOCK (MISCELLANEOUS) ×1 IMPLANT
TUBING CIL FLEX 10 FLL-RA (TUBING) ×1 IMPLANT

## 2022-12-01 NOTE — Interval H&P Note (Signed)
History and Physical Interval Note:  12/01/2022 9:52 AM  Misty Evans  has presented today for surgery, with the diagnosis of unstable angina.  The various methods of treatment have been discussed with the patient and family. After consideration of risks, benefits and other options for treatment, the patient has consented to  Procedure(s): LEFT HEART CATH AND CORONARY ANGIOGRAPHY (N/A) as a surgical intervention.  The patient's history has been reviewed, patient examined, no change in status, stable for surgery.  I have reviewed the patient's chart and labs.  Questions were answered to the patient's satisfaction.    Cath Lab Visit (complete for each Cath Lab visit)  Clinical Evaluation Leading to the Procedure:   ACS: No.  Non-ACS:    Anginal Classification: CCS III  Anti-ischemic medical therapy: Minimal Therapy (1 class of medications)  Non-Invasive Test Results: No non-invasive testing performed  Prior CABG: No previous CABG  Gera Inboden

## 2022-12-01 NOTE — Discharge Instructions (Signed)

## 2022-12-02 ENCOUNTER — Telehealth: Payer: Self-pay

## 2022-12-02 ENCOUNTER — Encounter (HOSPITAL_COMMUNITY): Payer: Self-pay | Admitting: Internal Medicine

## 2022-12-02 DIAGNOSIS — N289 Disorder of kidney and ureter, unspecified: Secondary | ICD-10-CM

## 2022-12-02 NOTE — Telephone Encounter (Signed)
-----   Message from Garwin Brothers, MD sent at 12/01/2022 12:35 PM EDT ----- Recheck renal function in 1 week.  Please tell patient to keep herself well-hydrated.  The results of the study is unremarkable. Please inform patient. I will discuss in detail at next appointment. Cc  primary care/referring physician Garwin Brothers, MD 12/01/2022 12:35 PM

## 2022-12-02 NOTE — Telephone Encounter (Signed)
Left vm to call back

## 2022-12-03 NOTE — Addendum Note (Signed)
Addended by: Eleonore Chiquito on: 12/03/2022 11:17 AM   Modules accepted: Orders

## 2022-12-03 NOTE — Telephone Encounter (Signed)
Patient is returning call and is requesting return call.  

## 2022-12-03 NOTE — Telephone Encounter (Signed)
Results reviewed with pt as per Dr. Revankar's note.  Pt verbalized understanding and had no additional questions. Routed to PCP.  

## 2022-12-08 DIAGNOSIS — J449 Chronic obstructive pulmonary disease, unspecified: Secondary | ICD-10-CM | POA: Diagnosis not present

## 2022-12-09 DIAGNOSIS — N289 Disorder of kidney and ureter, unspecified: Secondary | ICD-10-CM | POA: Diagnosis not present

## 2022-12-10 ENCOUNTER — Telehealth: Payer: Self-pay

## 2022-12-10 ENCOUNTER — Telehealth: Payer: Self-pay | Admitting: Cardiology

## 2022-12-10 LAB — BASIC METABOLIC PANEL
BUN/Creatinine Ratio: 21 (ref 12–28)
BUN: 21 mg/dL (ref 8–27)
CO2: 26 mmol/L (ref 20–29)
Calcium: 9.7 mg/dL (ref 8.7–10.3)
Chloride: 101 mmol/L (ref 96–106)
Creatinine, Ser: 1.02 mg/dL — ABNORMAL HIGH (ref 0.57–1.00)
Glucose: 115 mg/dL — ABNORMAL HIGH (ref 70–99)
Potassium: 4.4 mmol/L (ref 3.5–5.2)
Sodium: 142 mmol/L (ref 134–144)
eGFR: 58 mL/min/{1.73_m2} — ABNORMAL LOW (ref >59)

## 2022-12-10 NOTE — Telephone Encounter (Signed)
Patient returned RN's call regarding results. 

## 2022-12-10 NOTE — Telephone Encounter (Signed)
Results reviewed with pt as per Dr. Revankar's note.  Pt verbalized understanding and had no additional questions. Routed to PCP.  

## 2022-12-15 ENCOUNTER — Other Ambulatory Visit: Payer: Self-pay

## 2022-12-17 ENCOUNTER — Encounter: Payer: Self-pay | Admitting: Cardiology

## 2022-12-17 ENCOUNTER — Ambulatory Visit: Payer: Medicare Other | Attending: Cardiology | Admitting: Cardiology

## 2022-12-17 VITALS — BP 170/110 | HR 66 | Ht 60.0 in | Wt 102.0 lb

## 2022-12-17 DIAGNOSIS — I10 Essential (primary) hypertension: Secondary | ICD-10-CM

## 2022-12-17 DIAGNOSIS — E782 Mixed hyperlipidemia: Secondary | ICD-10-CM

## 2022-12-17 DIAGNOSIS — J431 Panlobular emphysema: Secondary | ICD-10-CM

## 2022-12-17 DIAGNOSIS — I700 Atherosclerosis of aorta: Secondary | ICD-10-CM | POA: Diagnosis not present

## 2022-12-17 DIAGNOSIS — I712 Thoracic aortic aneurysm, without rupture, unspecified: Secondary | ICD-10-CM

## 2022-12-17 MED ORDER — ALPRAZOLAM 0.25 MG PO TABS: 0.1250 mg | ORAL_TABLET | Freq: Every evening | ORAL | 0 refills | Status: DC | PRN

## 2022-12-17 NOTE — Patient Instructions (Signed)
Please keep a BP log for 2 weeks and send by MyChart or mail.  Blood Pressure Record Sheet To take your blood pressure, you will need a blood pressure machine. You can buy a blood pressure machine (blood pressure monitor) at your clinic, drug store, or online. When choosing one, consider: An automatic monitor that has an arm cuff. A cuff that wraps snugly around your upper arm. You should be able to fit only one finger between your arm and the cuff. A device that stores blood pressure reading results. Do not choose a monitor that measures your blood pressure from your wrist or finger. Follow your health care provider's instructions for how to take your blood pressure. To use this form: Get one reading in the morning (a.m.) 1-2 hours after you take any medicines. Get one reading in the evening (p.m.) before supper.   Blood pressure log Date: _______________________  a.m. _____________________(1st reading) HR___________            p.m. _____________________(2nd reading) HR__________  Date: _______________________  a.m. _____________________(1st reading) HR___________            p.m. _____________________(2nd reading) HR__________  Date: _______________________  a.m. _____________________(1st reading) HR___________            p.m. _____________________(2nd reading) HR__________  Date: _______________________  a.m. _____________________(1st reading) HR___________            p.m. _____________________(2nd reading) HR__________  Date: _______________________  a.m. _____________________(1st reading) HR___________            p.m. _____________________(2nd reading) HR__________  Date: _______________________  a.m. _____________________(1st reading) HR___________            p.m. _____________________(2nd reading) HR__________  Date: _______________________  a.m. _____________________(1st reading) HR___________            p.m. _____________________(2nd reading)  HR__________   This information is not intended to replace advice given to you by your health care provider. Make sure you discuss any questions you have with your health care provider. Document Revised: 09/07/2019 Document Reviewed: 09/07/2019 Elsevier Patient Education  2021 Elsevier Inc.   Medication Instructions:  Your physician recommends that you continue on your current medications as directed. Please refer to the Current Medication list given to you today.  *If you need a refill on your cardiac medications before your next appointment, please call your pharmacy*   Lab Work: None ordered If you have labs (blood work) drawn today and your tests are completely normal, you will receive your results only by: MyChart Message (if you have MyChart) OR A paper copy in the mail If you have any lab test that is abnormal or we need to change your treatment, we will call you to review the results.   Testing/Procedures: None ordered   Follow-Up: At [facility], you and your health needs are our priority.  As part of our continuing mission to provide you with exceptional heart care, we have created designated Provider Care Teams.  These Care Teams include your primary Cardiologist (physician) and Advanced Practice Providers (APPs -  Physician Assistants and Nurse Practitioners) who all work together to provide you with the care you need, when you need it.  We recommend signing up for the patient portal called "MyChart".  Sign up information is provided on this After Visit Summary.  MyChart is used to connect with patients for Virtual Visits (Telemedicine).  Patients are able to view lab/test results, encounter notes, upcoming appointments, etc.  Non-urgent messages can be sent to your  provider as well.   To learn more about what you can do with MyChart, go to [email or web address].    Your next appointment:   6 month(s)  The format for your next appointment:   In  Person  Provider:   Belva Crome, MD    Other Instructions none  Important Information About Sugar

## 2022-12-17 NOTE — Progress Notes (Signed)
Cardiology Office Note:    Date:  12/17/2022   ID:  Misty Evans, DOB June 05, 1950, MRN [id number]  PCP:  Blane Ohara, MD  Cardiologist:  Garwin Brothers, MD   Referring MD: Blane Ohara, MD    ASSESSMENT:    1. Thoracic aortic aneurysm without rupture, unspecified part (HCC)   2. Aortic atherosclerosis (HCC)   3. Essential hypertension   4. Panlobular emphysema (HCC)   5. Mixed dyslipidemia    PLAN:    In order of problems listed above:  Primary prevention stressed with the patient.  Importance of compliance with diet medication stressed and patient verbalized standing. Focal aneurysm of the thoracic aorta: Patient has an appointment with cardiovascular and thoracic surgeons on December 31.  I will await their opinion for this. Essential hypertension: Patient has an element of whitecoat hypertension.  Her blood pressure is better at home according to the patient and her daughter.  To keep a track of this.  She appears anxious.  I have asked her to keep a blood pressure log and send it to [place] in a week.  I prescribed Xanax 0.25 mg.  10 tablets with no refills.  She will use 1 tablet on a as needed basis.  Precautions advised. Mixed dyslipidemia: On lipid-lowering medications followed by primary care.  Diet emphasized. Further recommendations will be made based on the guidance of the cardiothoracic colleagues.   Medication Adjustments/Labs and Tests Ordered: Current medicines are reviewed at length with the patient today.  Concerns regarding medicines are outlined above.  No orders of the defined types were placed in this encounter.  No orders of the defined types were placed in this encounter.    No chief complaint on file.    History of Present Illness:    Misty Evans is a 72 y.o. female.  Patient has past medical history of focal aneurysm in the aorta, COPD, ex-smoker, dyslipidemia.  She denies any problems at this time and takes care of activities  of daily living.  She has a history of chest tightness.  She has whitecoat hypertension.  Coronary angiography revealed unremarkable coronary arteries.  At the time of my evaluation, the patient is alert awake oriented and in no distress.  Her daughter accompanies her for this visit.  Past Medical History:  Diagnosis Date   Abnormal weight loss 01/04/2020   Acute respiratory failure with hypoxia (HCC) 10/05/2022   Allergic rhinitis due to allergen 07/03/2021   Aortic aneurysm of unspecified site, without rupture (HCC) 11/26/2022   Aortic atherosclerosis (HCC) 06/25/2021   COPD (chronic obstructive pulmonary disease) (HCC)    COPD exacerbation (HCC) 11/12/2019   Depression    Depression, major, recurrent, mild (HCC) 11/12/2019   Encounter for screening mammogram for malignant neoplasm of breast 10/05/2022   Essential hypertension 11/02/2019   Ex-smoker 11/26/2022   GERD (gastroesophageal reflux disease)    History of nephrolithiasis 02/04/2019   Insomnia    Mild protein-calorie malnutrition (HCC) 11/12/2019   Mixed dyslipidemia 11/02/2019   Mixed hyperlipidemia 11/02/2019   Need for immunization against influenza 04/06/2022   Nodule of left lung 01/04/2020   Osteoporosis    Renal stones 05/2015   Screening for lung cancer 10/05/2022   Shortness of breath 10/05/2022    Past Surgical History:  Procedure Laterality Date   ABDOMINAL HYSTERECTOMY     APPENDECTOMY     BACK SURGERY     x2   CHOLECYSTECTOMY     COLONOSCOPY  LEFT HEART CATH AND CORONARY ANGIOGRAPHY N/A 12/01/2022   Procedure: LEFT HEART CATH AND CORONARY ANGIOGRAPHY;  Surgeon: Yvonne Kendall, MD;  Location: [facility];  Service: Cardiovascular;  Laterality: N/A;    Current Medications: Current Meds  Medication Sig   albuterol (ACCUNEB) 1.25 MG/3ML nebulizer solution Take 1 ampule by nebulization every 6 (six) hours as needed for wheezing.   albuterol (VENTOLIN HFA) 108 (90 Base) MCG/ACT inhaler USE 2  INHALATIONS BY MOUTH EVERY 6 HOURS AS NEEDED FOR WHEEZING  OR SHORTNESS OF BREATH   ALPRAZolam (XANAX) 0.25 MG tablet Take 0.5 tablets (0.125 mg total) by mouth at bedtime as needed for anxiety.   aspirin 81 MG chewable tablet Chew 81 mg by mouth daily.   cyanocobalamin (VITAMIN B12) 1000 MCG tablet Take 1,000 mcg by mouth daily.   FLUoxetine (PROZAC) 20 MG capsule Take 1 capsule (20 mg total) by mouth daily.   Fluticasone-Umeclidin-Vilant (TRELEGY ELLIPTA) 100-62.5-25 MCG/ACT AEPB Inhale 1 puff into the lungs daily.   ibuprofen (ADVIL) 200 MG tablet Take 800 mg by mouth every 8 (eight) hours as needed for moderate pain.   irbesartan (AVAPRO) 75 MG tablet TAKE 1 TABLET BY MOUTH DAILY   isosorbide mononitrate (IMDUR) 30 MG 24 hr tablet Take 0.5 tablets (15 mg total) by mouth daily.   nitroGLYCERIN (NITROSTAT) 0.4 MG SL tablet Place 0.4 mg under the tongue every 5 (five) minutes as needed for chest pain.   OXYGEN Inhale 2 L into the lungs as needed (shortness of breath).   pravastatin (PRAVACHOL) 10 MG tablet TAKE 1 TABLET BY MOUTH ONCE  DAILY   propranolol ER (INDERAL LA) 120 MG 24 hr capsule TAKE 1 CAPSULE BY MOUTH DAILY     Allergies:   Penicillin g and Sulfamethoxazole   Social History   Socioeconomic History   Marital status: Married    Spouse name: Charles   Number of children: 2   Years of education: Not on file   Highest education level: Not on file  Occupational History   Occupation: Retired  Tobacco Use   Smoking status: Former    Current packs/day: 0.00    Average packs/day: 0.3 packs/day for 54.0 years (13.5 ttl pk-yrs)    Types: Cigarettes    Start date: 03/04/1967    Quit date: 03/03/2021    Years since quitting: 1.7   Smokeless tobacco: Never   Tobacco comments:    Has smoked since 72 years old  Vaping Use   Vaping status: Some Days  Substance and Sexual Activity   Alcohol use: Not Currently   Drug use: Never   Sexual activity: Not on file  Other Topics Concern    Not on file  Social History Narrative   Not on file   Social Determinants of Health   Financial Resource Strain: Low Risk  (10/01/2022)   Overall Financial Resource Strain (CARDIA)    Difficulty of Paying Living Expenses: Not hard at all  Food Insecurity: No Food Insecurity (10/01/2022)   Hunger Vital Sign    Worried About Running Out of Food in the Last Year: Never true    Ran Out of Food in the Last Year: Never true  Transportation Needs: No Transportation Needs (10/01/2022)   PRAPARE - Administrator, Civil Service (Medical): No    Lack of Transportation (Non-Medical): No  Physical Activity: Inactive (10/01/2022)   Exercise Vital Sign    Days of Exercise per Week: 0 days    Minutes of  Exercise per Session: 0 min  Stress: No Stress Concern Present (10/01/2022)   [employer] of Occupational Health - Occupational Stress Questionnaire    Feeling of Stress : Not at all  Social Connections: Moderately Isolated (10/01/2022)   Social Connection and Isolation Panel [NHANES]    Frequency of Communication with Friends and Family: More than three times a week    Frequency of Social Gatherings with Friends and Family: Once a week    Attends Religious Services: Never    Database administrator or Organizations: No    Attends Engineer, structural: Never    Marital Status: Married     Family History: The patient's family history includes Cancer in her father; Diabetes in her paternal grandmother; Heart attack in her maternal grandmother; Hypertension in her father, maternal grandmother, mother, and paternal grandmother. There is no history of Colon cancer, Pancreatic cancer, Esophageal cancer, Rectal cancer, or Stomach cancer.  ROS:   Please see the history of present illness.    All other systems reviewed and are negative.  EKGs/Labs/Other Studies Reviewed:    The following studies were reviewed today: I discussed coronary angiography report with the patient at  length.   Recent Labs: 10/01/2022: ALT 10; TSH 1.730 11/26/2022: Hemoglobin 16.1; Platelets 246 12/09/2022: BUN 21; Creatinine, Ser 1.02; Potassium 4.4; Sodium 142  Recent Lipid Panel    Component Value Date/Time   CHOL 167 10/01/2022 1147   TRIG 107 10/01/2022 1147   HDL 60 10/01/2022 1147   CHOLHDL 2.8 10/01/2022 1147   LDLCALC 88 10/01/2022 1147    Physical Exam:    VS:  BP (!) 191/103   Pulse 66   Ht 5' (1.524 m)   Wt 102 lb (46.3 kg)   SpO2 92%   BMI 19.92 kg/m     Wt Readings from Last 3 Encounters:  12/17/22 102 lb (46.3 kg)  12/01/22 101 lb (45.8 kg)  11/26/22 101 lb 6.4 oz (46 kg)     GEN: Patient is in no acute distress HEENT: Normal NECK: No JVD; No carotid bruits LYMPHATICS: No lymphadenopathy CARDIAC: Hear sounds regular, 2/6 systolic murmur at the apex. RESPIRATORY:  Clear to auscultation without rales, wheezing or rhonchi  ABDOMEN: Soft, non-tender, non-distended MUSCULOSKELETAL:  No edema; No deformity  SKIN: Warm and dry NEUROLOGIC:  Alert and oriented x 3 PSYCHIATRIC:  Normal affect   Signed, Garwin Brothers, MD  12/17/2022 10:02 AM    [facility]

## 2022-12-31 ENCOUNTER — Institutional Professional Consult (permissible substitution): Payer: Medicare Other | Admitting: Surgery

## 2022-12-31 ENCOUNTER — Encounter: Payer: Self-pay | Admitting: Surgery

## 2022-12-31 VITALS — BP 147/88 | HR 69 | Resp 18 | Ht 60.0 in | Wt 101.0 lb

## 2022-12-31 DIAGNOSIS — I7122 Aneurysm of the aortic arch, without rupture: Secondary | ICD-10-CM

## 2022-12-31 NOTE — Progress Notes (Unsigned)
Cardiothoracic Surgery Admission History and Physical  PCP is Cox, Fritzi Mandes, MD Referring Provider is Cox, Fritzi Mandes, MD  Chief Complaint  Patient presents with  . Consult    Chest CT 5/8    HPI:   Past Medical History:  Diagnosis Date  . Abnormal weight loss 01/04/2020  . Acute respiratory failure with hypoxia (HCC) 10/05/2022  . Allergic rhinitis due to allergen 07/03/2021  . Aortic aneurysm of unspecified site, without rupture (HCC) 11/26/2022  . Aortic atherosclerosis (HCC) 06/25/2021  . COPD (chronic obstructive pulmonary disease) (HCC)   . COPD exacerbation (HCC) 11/12/2019  . Depression   . Depression, major, recurrent, mild (HCC) 11/12/2019  . Encounter for screening mammogram for malignant neoplasm of breast 10/05/2022  . Essential hypertension 11/02/2019  . Ex-smoker 11/26/2022  . GERD (gastroesophageal reflux disease)   . History of nephrolithiasis 02/04/2019  . Insomnia   . Mild protein-calorie malnutrition (HCC) 11/12/2019  . Mixed dyslipidemia 11/02/2019  . Mixed hyperlipidemia 11/02/2019  . Need for immunization against influenza 04/06/2022  . Nodule of left lung 01/04/2020  . Osteoporosis   . Renal stones 05/2015  . Screening for lung cancer 10/05/2022  . Shortness of breath 10/05/2022    Past Surgical History:  Procedure Laterality Date  . ABDOMINAL HYSTERECTOMY    . APPENDECTOMY    . BACK SURGERY     x2  . CHOLECYSTECTOMY    . COLONOSCOPY    . LEFT HEART CATH AND CORONARY ANGIOGRAPHY N/A 12/01/2022   Procedure: LEFT HEART CATH AND CORONARY ANGIOGRAPHY;  Surgeon: Yvonne Kendall, MD;  Location: [facility];  Service: Cardiovascular;  Laterality: N/A;    Family History  Problem Relation Age of Onset  . Hypertension Mother   . Cancer Father        esophageal  . Hypertension Father   . Hypertension Maternal Grandmother   . Heart attack Maternal Grandmother   . Diabetes Paternal Grandmother   . Hypertension Paternal Grandmother   .  Colon cancer Neg Hx   . Pancreatic cancer Neg Hx   . Esophageal cancer Neg Hx   . Rectal cancer Neg Hx   . Stomach cancer Neg Hx     Social History Social History   Tobacco Use  . Smoking status: Former    Current packs/day: 0.00    Average packs/day: 0.3 packs/day for 54.0 years (13.5 ttl pk-yrs)    Types: Cigarettes    Start date: 03/04/1967    Quit date: 03/03/2021    Years since quitting: 1.8  . Smokeless tobacco: Never  . Tobacco comments:    Has smoked since 72 years old  Vaping Use  . Vaping status: Some Days  Substance Use Topics  . Alcohol use: Not Currently  . Drug use: Never    Current Outpatient Medications  Medication Sig Dispense Refill  . albuterol (ACCUNEB) 1.25 MG/3ML nebulizer solution Take 1 ampule by nebulization every 6 (six) hours as needed for wheezing.    Marland Kitchen albuterol (VENTOLIN HFA) 108 (90 Base) MCG/ACT inhaler USE 2 INHALATIONS BY MOUTH EVERY 6 HOURS AS NEEDED FOR WHEEZING  OR SHORTNESS OF BREATH 26.8 g 2  . ALPRAZolam (XANAX) 0.25 MG tablet Take 0.5 tablets (0.125 mg total) by mouth at bedtime as needed for anxiety. 10 tablet 0  . aspirin 81 MG chewable tablet Chew 81 mg by mouth daily.    . cyanocobalamin (VITAMIN B12) 1000 MCG tablet Take 1,000 mcg by mouth daily.    Marland Kitchen FLUoxetine (PROZAC)  20 MG capsule Take 1 capsule (20 mg total) by mouth daily. 90 capsule 1  . Fluticasone-Umeclidin-Vilant (TRELEGY ELLIPTA) 100-62.5-25 MCG/ACT AEPB Inhale 1 puff into the lungs daily. 3 each 3  . ibuprofen (ADVIL) 200 MG tablet Take 800 mg by mouth every 8 (eight) hours as needed for moderate pain.    Marland Kitchen irbesartan (AVAPRO) 75 MG tablet TAKE 1 TABLET BY MOUTH DAILY 90 tablet 3  . isosorbide mononitrate (IMDUR) 30 MG 24 hr tablet Take 0.5 tablets (15 mg total) by mouth daily. 30 tablet 5  . nitroGLYCERIN (NITROSTAT) 0.4 MG SL tablet Place 0.4 mg under the tongue every 5 (five) minutes as needed for chest pain.    . OXYGEN Inhale 2 L into the lungs as needed (shortness  of breath).    . pravastatin (PRAVACHOL) 10 MG tablet TAKE 1 TABLET BY MOUTH ONCE  DAILY 100 tablet 0  . propranolol ER (INDERAL LA) 120 MG 24 hr capsule TAKE 1 CAPSULE BY MOUTH DAILY 100 capsule 0   No current facility-administered medications for this visit.    Allergies  Allergen Reactions  . Penicillin G Rash  . Sulfamethoxazole Rash    Review of Systems  BP (!) 147/88 (BP Location: Right Arm, Patient Position: Sitting)   Pulse 69   Resp 18   Ht 5' (1.524 m)   Wt 101 lb (45.8 kg)   SpO2 (!) 82% Comment: RA  BMI 19.73 kg/m  Physical Exam   Diagnostic Tests:   Impression:   Plan:   Alleen Borne, MD [facility] [phone]

## 2023-01-08 DIAGNOSIS — J449 Chronic obstructive pulmonary disease, unspecified: Secondary | ICD-10-CM | POA: Diagnosis not present

## 2023-01-13 NOTE — Progress Notes (Signed)
Subjective:  Patient ID: Misty Evans, female    DOB: 1950/07/01  Age: 72 y.o. MRN: [id number]  Chief Complaint  Patient presents with   Medical Management of Chronic Issues   HPI Hyperlipidemia/Aortic atherosclerosis:  Patient is currently taking Pravastatin 10 mg take 1 tablet daily.   Hypertension: Poorly controlled.  Patient has significant anxiety as she is having to take care for her disabled husband.  Patient saw cardiology, Dr. Tomie China.  Left heart cath showed mild to moderate nonobstructive coronary arteries.  Patient does have a thoracic aneurysm.  She saw a vascular surgeon, Dr. Evelene Croon, on July 31.  On CTA.  of the chest on 11/11/2022 showed extensive calcific atherosclerosis of the entire aorta including the ascending, arch, and descending aorta. There was a 1.9 x 1.8 x 1.1 cm focal aneurysm of the inferior aortic arch just beyond the origin of the left subclavian artery. Previous CT scans of the chest dating back to 2021 showed that this was present but may be slightly larger than when it was first noticed.  Dr. Laneta Simmers recommended follow-up in 1 year with him and repeat CTA scan of her chest.  Patient is a poor surgical candidate due to her severe COPD with hypoxemia.  He is currently recommended good blood pressure control. Patient is currently on irbesartan 75 mg daily, propranolol er 120 mg once daily.  (She has been taking an addition irbesartan 75 mg at bedtime since her bp is running high).  Patient was given isosorbide 30 mg and recommended a half a pill daily.  She has not started this.  I count this is because she had some mild coronary artery disease.   Depression:  Patient is currently on prozac 20 mg once daily.  Patient was given low-dose Xanax by Dr. Tomie China.  #10 due to her significant anxiety.   COPD:  Patient has dyspnea on exertion. Has been using albuterol HFA 2 puffs prior to activity. Using oxygen @ 2L/min.  Trelegy helped the most. Failed breztri  and spiriva.      01/14/2023   10:49 AM 10/01/2022   11:14 AM 10/01/2022   10:37 AM 04/04/2022   11:31 AM 09/24/2021    8:42 AM  Depression screen PHQ 2/9  Decreased Interest 1 1 0 0   Down, Depressed, Hopeless 1 1 0 1   PHQ - 2 Score 2 2 0 1   Altered sleeping 1 0  1   Tired, decreased energy 3 3  1    Change in appetite 1 1  0   Feeling bad or failure about yourself  0 1  1   Trouble concentrating 0 0  0   Moving slowly or fidgety/restless 0 0  0   Suicidal thoughts 0 0  0   PHQ-9 Score 7 7  4    Difficult doing work/chores  Somewhat difficult  Somewhat difficult Not difficult at all      01/14/2023   10:50 AM 10/01/2022   10:38 AM  GAD 7 : Generalized Anxiety Score  Nervous, Anxious, on Edge 1 0  Control/stop worrying 0 0  Worry too much - different things 0 0  Trouble relaxing 0 0  Restless 0 0  Easily annoyed or irritable 1 0  Afraid - awful might happen 0 0  Total GAD 7 Score 2 0  Anxiety Difficulty Somewhat difficult Not difficult at all         Patient Care Team: Blane Ohara, MD as PCP -  General (Family Medicine) Earvin Hansen, [facility] (Inactive) as Pharmacist (Pharmacist) Lynann Bologna, MD as Consulting Physician (Gastroenterology) Tomma Lightning, MD as Consulting Physician (Pulmonary Disease) Revankar, Aundra Dubin, MD as Consulting Physician (Cardiology) Alleen Borne, MD as Consulting Physician (Cardiothoracic Surgery)   Review of Systems  Constitutional:  Negative for chills, fatigue and fever.  HENT:  Negative for congestion, ear pain, rhinorrhea and sore throat.   Respiratory:  Negative for cough and shortness of breath.   Cardiovascular:  Negative for chest pain.  Gastrointestinal:  Negative for abdominal pain, constipation, diarrhea, nausea and vomiting.  Genitourinary:  Negative for dysuria and urgency.  Musculoskeletal:  Negative for back pain and myalgias.  Neurological:  Negative for dizziness, weakness, light-headedness and headaches.   Psychiatric/Behavioral:  Negative for dysphoric mood. The patient is not nervous/anxious.     Current Outpatient Medications on File Prior to Visit  Medication Sig Dispense Refill   albuterol (ACCUNEB) 1.25 MG/3ML nebulizer solution Take 1 ampule by nebulization every 6 (six) hours as needed for wheezing.     albuterol (VENTOLIN HFA) 108 (90 Base) MCG/ACT inhaler USE 2 INHALATIONS BY MOUTH EVERY 6 HOURS AS NEEDED FOR WHEEZING  OR SHORTNESS OF BREATH 26.8 g 2   ALPRAZolam (XANAX) 0.25 MG tablet Take 0.5 tablets (0.125 mg total) by mouth at bedtime as needed for anxiety. 10 tablet 0   aspirin 81 MG chewable tablet Chew 81 mg by mouth daily.     cyanocobalamin (VITAMIN B12) 1000 MCG tablet Take 1,000 mcg by mouth daily.     FLUoxetine (PROZAC) 20 MG capsule Take 1 capsule (20 mg total) by mouth daily. 90 capsule 1   Fluticasone-Umeclidin-Vilant (TRELEGY ELLIPTA) 100-62.5-25 MCG/ACT AEPB Inhale 1 puff into the lungs daily. 3 each 3   ibuprofen (ADVIL) 200 MG tablet Take 800 mg by mouth every 8 (eight) hours as needed for moderate pain.     isosorbide mononitrate (IMDUR) 30 MG 24 hr tablet Take 0.5 tablets (15 mg total) by mouth daily. (Patient not taking: Reported on 01/14/2023) 30 tablet 5   nitroGLYCERIN (NITROSTAT) 0.4 MG SL tablet Place 0.4 mg under the tongue every 5 (five) minutes as needed for chest pain.     OXYGEN Inhale 2 L into the lungs as needed (shortness of breath).     pravastatin (PRAVACHOL) 10 MG tablet TAKE 1 TABLET BY MOUTH ONCE  DAILY 100 tablet 0   propranolol ER (INDERAL LA) 120 MG 24 hr capsule TAKE 1 CAPSULE BY MOUTH DAILY 100 capsule 0   No current facility-administered medications on file prior to visit.   Past Medical History:  Diagnosis Date   Abnormal weight loss 01/04/2020   Acute respiratory failure with hypoxia (HCC) 10/05/2022   Allergic rhinitis due to allergen 07/03/2021   Aortic aneurysm of unspecified site, without rupture (HCC) 11/26/2022   Aortic  atherosclerosis (HCC) 06/25/2021   COPD (chronic obstructive pulmonary disease) (HCC)    COPD exacerbation (HCC) 11/12/2019   Depression    Depression, major, recurrent, mild (HCC) 11/12/2019   Encounter for screening mammogram for malignant neoplasm of breast 10/05/2022   Essential hypertension 11/02/2019   Ex-smoker 11/26/2022   GERD (gastroesophageal reflux disease)    History of nephrolithiasis 02/04/2019   Insomnia    Mild protein-calorie malnutrition (HCC) 11/12/2019   Mixed dyslipidemia 11/02/2019   Mixed hyperlipidemia 11/02/2019   Need for immunization against influenza 04/06/2022   Nodule of left lung 01/04/2020   Osteoporosis    Renal stones 05/2015  Screening for lung cancer 10/05/2022   Shortness of breath 10/05/2022   Past Surgical History:  Procedure Laterality Date   ABDOMINAL HYSTERECTOMY     APPENDECTOMY     BACK SURGERY     x2   CHOLECYSTECTOMY     COLONOSCOPY     LEFT HEART CATH AND CORONARY ANGIOGRAPHY N/A 12/01/2022   Procedure: LEFT HEART CATH AND CORONARY ANGIOGRAPHY;  Surgeon: Yvonne Kendall, MD;  Location: [facility];  Service: Cardiovascular;  Laterality: N/A;    Family History  Problem Relation Age of Onset   Hypertension Mother    Cancer Father        esophageal   Hypertension Father    Hypertension Maternal Grandmother    Heart attack Maternal Grandmother    Diabetes Paternal Grandmother    Hypertension Paternal Grandmother    Colon cancer Neg Hx    Pancreatic cancer Neg Hx    Esophageal cancer Neg Hx    Rectal cancer Neg Hx    Stomach cancer Neg Hx    Social History   Socioeconomic History   Marital status: Married    Spouse name: Charles   Number of children: 2   Years of education: Not on file   Highest education level: Not on file  Occupational History   Occupation: Retired  Tobacco Use   Smoking status: Former    Current packs/day: 0.00    Average packs/day: 0.3 packs/day for 54.0 years (13.5 ttl pk-yrs)     Types: Cigarettes    Start date: 03/04/1967    Quit date: 03/03/2021    Years since quitting: 1.8   Smokeless tobacco: Never   Tobacco comments:    Has smoked since 72 years old  Vaping Use   Vaping status: Some Days  Substance and Sexual Activity   Alcohol use: Not Currently   Drug use: Never   Sexual activity: Not on file  Other Topics Concern   Not on file  Social History Narrative   Not on file   Social Determinants of Health   Financial Resource Strain: Low Risk  (10/01/2022)   Overall Financial Resource Strain (CARDIA)    Difficulty of Paying Living Expenses: Not hard at all  Food Insecurity: No Food Insecurity (10/01/2022)   Hunger Vital Sign    Worried About Running Out of Food in the Last Year: Never true    Ran Out of Food in the Last Year: Never true  Transportation Needs: No Transportation Needs (10/01/2022)   PRAPARE - Administrator, Civil Service (Medical): No    Lack of Transportation (Non-Medical): No  Physical Activity: Inactive (10/01/2022)   Exercise Vital Sign    Days of Exercise per Week: 0 days    Minutes of Exercise per Session: 0 min  Stress: No Stress Concern Present (10/01/2022)   [employer] of Occupational Health - Occupational Stress Questionnaire    Feeling of Stress : Not at all  Social Connections: Moderately Isolated (10/01/2022)   Social Connection and Isolation Panel [NHANES]    Frequency of Communication with Friends and Family: More than three times a week    Frequency of Social Gatherings with Friends and Family: Once a week    Attends Religious Services: Never    Database administrator or Organizations: No    Attends Banker Meetings: Never    Marital Status: Married    Objective:  BP (!) 160/96   Pulse 60   Temp 99.1 F (  37.3 C)   Resp 18   Ht 5' (1.524 m)   Wt 100 lb (45.4 kg)   BMI 19.53 kg/m      01/14/2023   10:54 AM 01/14/2023   10:42 AM 12/31/2022   12:58 PM  BP/Weight  Systolic BP 160 188 147   Diastolic BP 96 100 88  Wt. (Lbs)  100 101  BMI  19.53 kg/m2 19.73 kg/m2    Physical Exam Vitals reviewed.  Constitutional:      Appearance: Normal appearance. She is normal weight.  Neck:     Vascular: No carotid bruit.  Cardiovascular:     Rate and Rhythm: Normal rate and regular rhythm.     Heart sounds: Normal heart sounds.  Pulmonary:     Effort: Pulmonary effort is normal. No respiratory distress.     Breath sounds: Normal breath sounds.  Abdominal:     General: Abdomen is flat. Bowel sounds are normal.     Palpations: Abdomen is soft.     Tenderness: There is no abdominal tenderness.  Neurological:     Mental Status: She is alert and oriented to person, place, and time.  Psychiatric:        Mood and Affect: Mood normal.        Behavior: Behavior normal.     Diabetic Foot Exam - Simple   No data filed      Lab Results  Component Value Date   WBC 8.7 01/14/2023   HGB 14.9 01/14/2023   HCT 47.5 (H) 01/14/2023   PLT 284 01/14/2023   GLUCOSE 67 (L) 01/14/2023   CHOL 139 01/14/2023   TRIG 79 01/14/2023   HDL 51 01/14/2023   LDLCALC 73 01/14/2023   ALT 8 01/14/2023   AST 18 01/14/2023   NA 143 01/14/2023   K 4.2 01/14/2023   CL 99 01/14/2023   CREATININE 0.94 01/14/2023   BUN 18 01/14/2023   CO2 28 01/14/2023   TSH 1.730 10/01/2022      Assessment & Plan:    Mixed hyperlipidemia Assessment & Plan: LDL was not quite at goal of 55.  I would recommend increased pravastatin to 20 mg nightly the patient is tolerating the lower dose and is willing.  Orders: -     Lipid panel  Essential hypertension Assessment & Plan: Blood pressure uncontrolled. Increase irbesartan 150 mg daily as patient is already taking this I will also recommend isosorbide mononitrate (Imdur) 30 mg half pill daily. Continue propranolol ER 120 mg daily.  Keep blood pressure log. Patient is scheduled to return for Medicare annual wellness visit in September 2024.  At that time if  her blood pressure is still elevated I would increase the irbesartan to 300 mg daily. Check labs.    Orders: -     CBC with Differential/Platelet -     Comprehensive metabolic panel -     Irbesartan; Take 1 tablet (150 mg total) by mouth daily.  Dispense: 90 tablet; Refill: 0  Depression, major, recurrent, mild (HCC) Assessment & Plan: Continue fluoxetine 20 mg daily for depression and anxiety. Speak with Leonette Most' doctor about getting home health care to decrease her stress. Has some Xanax to take for severe anxiety.   Acute respiratory failure with hypoxia (HCC) Assessment & Plan: Continue oxygen 2 L daily.   Mixed simple and mucopurulent chronic bronchitis (HCC) Assessment & Plan: Continue current medications. Patient needs to follow-up with pulmonary   Aortic atherosclerosis [employer]) Assessment & Plan: Increase pravastatin to  20 mg nightly.   Ex-smoker Assessment & Plan: Encouraged to not smoke   Aneurysm of aortic arch without rupture [facility]) Assessment & Plan: Follow-up annually with vascular surgeon.  CTA of chest annually. Poor surgical candidate.   Age-related osteoporosis without current pathological fracture Assessment & Plan: Order DEXA  Orders: -     DG Bone Density; Future     Meds ordered this encounter  Medications   irbesartan (AVAPRO) 150 MG tablet    Sig: Take 1 tablet (150 mg total) by mouth daily.    Dispense:  90 tablet    Refill:  0    Please send a replace/new response with 100-Day Supply if appropriate to maximize member benefit. Requesting 1 year supply.    Orders Placed This Encounter  Procedures   DG Bone Density   CBC with Differential/Platelet   Comprehensive metabolic panel   Lipid panel     Follow-up: Return in about 3 months (around 04/16/2023) for chronic follow up, awv in 02/2023 with Dina.   I,Katherina A Bramblett,acting as a scribe for Blane Ohara, MD.,have documented all relevant documentation on the behalf of  Blane Ohara, MD,as directed by  Blane Ohara, MD while in the presence of Blane Ohara, MD.   Clayborn Bigness I Leal-Borjas,acting as a scribe for Blane Ohara, MD.,have documented all relevant documentation on the behalf of Blane Ohara, MD,as directed by  Blane Ohara, MD while in the presence of Blane Ohara, MD.    An After Visit Summary was printed and given to the patient.  Blane Ohara, MD [facility] [phone]

## 2023-01-13 NOTE — Assessment & Plan Note (Addendum)
Blood pressure uncontrolled. Increase irbesartan 150 mg daily as patient is already taking this I will also recommend isosorbide mononitrate (Imdur) 30 mg half pill daily. Continue propranolol ER 120 mg daily.  Keep blood pressure log. Patient is scheduled to return for Medicare annual wellness visit in September 2024.  At that time if her blood pressure is still elevated I would increase the irbesartan to 300 mg daily. Check labs.

## 2023-01-13 NOTE — Assessment & Plan Note (Signed)
Well controlled.  ?No changes to medicines. Continue pravastatin 10 mg before bed.  ?Continue to work on eating a healthy diet and exercise.  ?Labs drawn today.  ? ?

## 2023-01-13 NOTE — Assessment & Plan Note (Addendum)
Continue fluoxetine 20 mg daily for depression and anxiety. Speak with Leonette Most' doctor about getting home health care to decrease her stress. Has some Xanax to take for severe anxiety.

## 2023-01-13 NOTE — Assessment & Plan Note (Addendum)
LDL was not quite at goal of 55.  I would recommend increased pravastatin to 20 mg nightly the patient is tolerating the lower dose and is willing.

## 2023-01-14 ENCOUNTER — Ambulatory Visit (INDEPENDENT_AMBULATORY_CARE_PROVIDER_SITE_OTHER): Payer: Medicare Other | Admitting: Family Medicine

## 2023-01-14 ENCOUNTER — Encounter: Payer: Self-pay | Admitting: Family Medicine

## 2023-01-14 VITALS — BP 160/96 | HR 60 | Temp 99.1°F | Resp 18 | Ht 60.0 in | Wt 100.0 lb

## 2023-01-14 DIAGNOSIS — Z87891 Personal history of nicotine dependence: Secondary | ICD-10-CM

## 2023-01-14 DIAGNOSIS — M810 Age-related osteoporosis without current pathological fracture: Secondary | ICD-10-CM

## 2023-01-14 DIAGNOSIS — I7122 Aneurysm of the aortic arch, without rupture: Secondary | ICD-10-CM

## 2023-01-14 DIAGNOSIS — F330 Major depressive disorder, recurrent, mild: Secondary | ICD-10-CM

## 2023-01-14 DIAGNOSIS — J9601 Acute respiratory failure with hypoxia: Secondary | ICD-10-CM

## 2023-01-14 DIAGNOSIS — I10 Essential (primary) hypertension: Secondary | ICD-10-CM | POA: Diagnosis not present

## 2023-01-14 DIAGNOSIS — E782 Mixed hyperlipidemia: Secondary | ICD-10-CM

## 2023-01-14 DIAGNOSIS — I700 Atherosclerosis of aorta: Secondary | ICD-10-CM | POA: Diagnosis not present

## 2023-01-14 DIAGNOSIS — J418 Mixed simple and mucopurulent chronic bronchitis: Secondary | ICD-10-CM

## 2023-01-14 MED ORDER — IRBESARTAN 150 MG PO TABS: 150.0000 mg | ORAL_TABLET | Freq: Every day | ORAL | 0 refills | Status: DC

## 2023-01-14 NOTE — Patient Instructions (Addendum)
Recommend isosorbide mononitrate (Imdur) 30 mg half pill daily. Increase irbesartan to 150 mg once daily. Continue pravastatin unless cholesterol increases. Continue fluoxetine 20 mg daily for depression and anxiety.  Speak with Leonette Most' doctor about getting home health care.

## 2023-01-15 LAB — COMPREHENSIVE METABOLIC PANEL
ALT: 8 IU/L (ref 0–32)
AST: 18 IU/L (ref 0–40)
Albumin: 4 g/dL (ref 3.8–4.8)
Alkaline Phosphatase: 70 IU/L (ref 44–121)
BUN/Creatinine Ratio: 19 (ref 12–28)
BUN: 18 mg/dL (ref 8–27)
Bilirubin Total: 1.1 mg/dL (ref 0.0–1.2)
CO2: 28 mmol/L (ref 20–29)
Calcium: 9.5 mg/dL (ref 8.7–10.3)
Chloride: 99 mmol/L (ref 96–106)
Creatinine, Ser: 0.94 mg/dL (ref 0.57–1.00)
Globulin, Total: 2.9 g/dL (ref 1.5–4.5)
Glucose: 67 mg/dL — ABNORMAL LOW (ref 70–99)
Potassium: 4.2 mmol/L (ref 3.5–5.2)
Sodium: 143 mmol/L (ref 134–144)
Total Protein: 6.9 g/dL (ref 6.0–8.5)
eGFR: 64 mL/min/{1.73_m2} (ref >59)

## 2023-01-15 LAB — CBC WITH DIFFERENTIAL/PLATELET
Basophils Absolute: 0.1 10*3/uL (ref 0.0–0.2)
Basos: 1 %
EOS (ABSOLUTE): 0.2 10*3/uL (ref 0.0–0.4)
Eos: 2 %
Hematocrit: 47.5 % — ABNORMAL HIGH (ref 34.0–46.6)
Hemoglobin: 14.9 g/dL (ref 11.1–15.9)
Immature Grans (Abs): 0.1 10*3/uL (ref 0.0–0.1)
Immature Granulocytes: 2 %
Lymphocytes Absolute: 1.2 10*3/uL (ref 0.7–3.1)
Lymphs: 14 %
MCH: 29.6 pg (ref 26.6–33.0)
MCHC: 31.4 g/dL — ABNORMAL LOW (ref 31.5–35.7)
MCV: 94 fL (ref 79–97)
Monocytes Absolute: 0.6 10*3/uL (ref 0.1–0.9)
Monocytes: 7 %
Neutrophils Absolute: 6.5 10*3/uL (ref 1.4–7.0)
Neutrophils: 74 %
Platelets: 284 10*3/uL (ref 150–450)
RBC: 5.04 x10E6/uL (ref 3.77–5.28)
RDW: 13.3 % (ref 11.7–15.4)
WBC: 8.7 10*3/uL (ref 3.4–10.8)

## 2023-01-15 LAB — LIPID PANEL
Chol/HDL Ratio: 2.7 ratio (ref 0.0–4.4)
Cholesterol, Total: 139 mg/dL (ref 100–199)
HDL: 51 mg/dL (ref >39)
LDL Chol Calc (NIH): 73 mg/dL (ref 0–99)
Triglycerides: 79 mg/dL (ref 0–149)
VLDL Cholesterol Cal: 15 mg/dL (ref 5–40)

## 2023-01-18 NOTE — Assessment & Plan Note (Signed)
Encouraged to not smoke.

## 2023-01-18 NOTE — Assessment & Plan Note (Signed)
Continue oxygen 2 L daily.

## 2023-01-18 NOTE — Assessment & Plan Note (Signed)
Follow-up annually with vascular surgeon.  CTA of chest annually. Poor surgical candidate.

## 2023-01-18 NOTE — Assessment & Plan Note (Signed)
Increase pravastatin to 20 mg nightly.

## 2023-01-18 NOTE — Assessment & Plan Note (Signed)
Continue current medications. Patient needs to follow-up with pulmonary

## 2023-01-18 NOTE — Assessment & Plan Note (Signed)
Order DEXA 

## 2023-01-19 ENCOUNTER — Other Ambulatory Visit: Payer: Self-pay

## 2023-01-19 MED ORDER — PRAVASTATIN SODIUM 20 MG PO TABS: 20.0000 mg | ORAL_TABLET | Freq: Every day | ORAL | 1 refills | Status: DC

## 2023-01-20 ENCOUNTER — Other Ambulatory Visit: Payer: Self-pay | Admitting: Family Medicine

## 2023-01-20 DIAGNOSIS — J4489 Other specified chronic obstructive pulmonary disease: Secondary | ICD-10-CM

## 2023-01-20 DIAGNOSIS — J9611 Chronic respiratory failure with hypoxia: Secondary | ICD-10-CM

## 2023-01-22 ENCOUNTER — Other Ambulatory Visit: Payer: Self-pay

## 2023-01-22 DIAGNOSIS — F330 Major depressive disorder, recurrent, mild: Secondary | ICD-10-CM

## 2023-01-22 MED ORDER — FLUOXETINE HCL 20 MG PO CAPS: 20.0000 mg | ORAL_CAPSULE | Freq: Every day | ORAL | 1 refills | Status: DC

## 2023-01-30 ENCOUNTER — Other Ambulatory Visit: Payer: Self-pay | Admitting: Physician Assistant

## 2023-02-12 ENCOUNTER — Encounter: Payer: Self-pay | Admitting: Family Medicine

## 2023-02-12 ENCOUNTER — Ambulatory Visit (INDEPENDENT_AMBULATORY_CARE_PROVIDER_SITE_OTHER): Payer: Medicare Other | Admitting: Family Medicine

## 2023-02-12 VITALS — BP 148/82 | HR 60 | Temp 98.6°F | Resp 18 | Ht <= 58 in | Wt 100.6 lb

## 2023-02-12 DIAGNOSIS — Z0000 Encounter for general adult medical examination without abnormal findings: Secondary | ICD-10-CM

## 2023-02-12 DIAGNOSIS — Z23 Encounter for immunization: Secondary | ICD-10-CM | POA: Diagnosis not present

## 2023-02-12 DIAGNOSIS — N3001 Acute cystitis with hematuria: Secondary | ICD-10-CM

## 2023-02-12 DIAGNOSIS — I10 Essential (primary) hypertension: Secondary | ICD-10-CM

## 2023-02-12 LAB — POCT URINALYSIS DIP (CLINITEK)
Bilirubin, UA: NEGATIVE
Glucose, UA: NEGATIVE mg/dL
Ketones, POC UA: NEGATIVE mg/dL
Nitrite, UA: NEGATIVE
POC PROTEIN,UA: 30 — AB
Spec Grav, UA: 1.025 (ref 1.010–1.025)
Urobilinogen, UA: 0.2 U/dL
pH, UA: 6 (ref 5.0–8.0)

## 2023-02-12 MED ORDER — NITROFURANTOIN MONOHYD MACRO 100 MG PO CAPS
100.0000 mg | ORAL_CAPSULE | Freq: Two times a day (BID) | ORAL | 0 refills | Status: AC
Start: 2023-02-12 — End: 2023-02-17

## 2023-02-12 NOTE — Assessment & Plan Note (Signed)
UA - positive Urine culture sent Increase water, decrease soda Try OTC AZO for symptom management Macrobid 100 mg by mouth TWICE A DAY  FU in 2 weeks, call if symptoms get worse

## 2023-02-12 NOTE — Assessment & Plan Note (Signed)
Flu vaccine given today Declined Hep C vaccine Eye exam to be scheduled  Cardiologist appointment scheduled 02/19/23 BP elevated, FU in 2 weeks

## 2023-02-12 NOTE — Progress Notes (Deleted)
Subjective:  Patient ID: Misty Evans, female    DOB: [birth year]  Age: 72 y.o. MRN: [id number]  No chief complaint on file.   HPI @HPI @ Well Adult Physical: Patient here for a comprehensive physical exam.The patient reports {problems:16946} Do you take any herbs or supplements that were not prescribed by a doctor? {yes/no/not asked:9010} Are you taking calcium supplements? {yes/no:63} Are you taking aspirin daily? {yes/no:63}  Physical ("At Risk" items are starred): Patient's last physical exam was 1 year ago .  Safety: reviewed ; Patient wears a seat belt, has smoke detectors, has carbon monoxide detectors, practices appropriate gun safety, and wears sunscreen with extended sun exposure. Dental Care: biannual cleanings, brushes and flosses daily. Ophthalmology/Optometry: Annual visit.  Hearing loss: none Vision impairments: none     01/14/2023   10:49 AM 10/01/2022   10:37 AM 09/24/2021    8:05 AM 02/07/2021    3:57 PM 09/26/2020    8:54 PM  Fall Risk   Falls in the past year? 0 0 1 0   Number falls in past yr: 0 0 0 0   Injury with Fall? 0 0 0 0   Risk for fall due to : No Fall Risks No Fall Risks  No Fall Risks No Fall Risks  Follow up Falls evaluation completed;Falls prevention discussed Falls evaluation completed  Falls evaluation completed;Education provided Falls evaluation completed        02/12/2023    2:32 PM 01/14/2023   10:49 AM 10/01/2022   11:14 AM 10/01/2022   10:37 AM 04/04/2022   11:31 AM  Depression screen PHQ 2/9  Decreased Interest  1 1 0 0  Down, Depressed, Hopeless  1 1 0 1  PHQ - 2 Score  2 2 0 1  Altered sleeping 1 1 0  1  Tired, decreased energy 1 3 3  1   Change in appetite 1 1 1   0  Feeling bad or failure about yourself  0 0 1  1  Trouble concentrating 0 0 0  0  Moving slowly or fidgety/restless 0 0 0  0  Suicidal thoughts 0 0 0  0  PHQ-9 Score  7 7  4   Difficult doing work/chores   Somewhat difficult  Somewhat difficult       Functional  Status Survey:     . Health Maintenance  Topic Date Due   Hepatitis C Screening  Never done   Zoster (Shingles) Vaccine (1 of 2) Never done   Flu Shot  01/01/2023   Mammogram  11/05/2023   Colon Cancer Screening  01/03/2024   Medicare Annual Wellness Visit  02/12/2024   Pneumonia Vaccine  Completed   DEXA scan (bone density measurement)  Completed   HPV Vaccine  Aged Out   DTaP/Tdap/Td vaccine  Discontinued   COVID-19 Vaccine  Discontinued   Cologuard (Stool DNA test)  Discontinued     Social Hx   Social History   Socioeconomic History   Marital status: Married    Spouse name: Charles   Number of children: 2   Years of education: Not on file   Highest education level: Not on file  Occupational History   Occupation: Retired  Tobacco Use   Smoking status: Former    Current packs/day: 0.00    Average packs/day: 0.3 packs/day for 54.0 years (13.5 ttl pk-yrs)    Types: Cigarettes    Start date: 03/04/1967    Quit date: 03/03/2021    Years since quitting: 1.9  Smokeless tobacco: Never   Tobacco comments:    Has smoked since 72 years old  Vaping Use   Vaping status: Some Days  Substance and Sexual Activity   Alcohol use: Not Currently   Drug use: Never   Sexual activity: Not on file  Other Topics Concern   Not on file  Social History Narrative   Not on file   Social Determinants of Health   Financial Resource Strain: Low Risk  (02/12/2023)   Overall Financial Resource Strain (CARDIA)    Difficulty of Paying Living Expenses: Not hard at all  Food Insecurity: No Food Insecurity (02/12/2023)   Hunger Vital Sign    Worried About Running Out of Food in the Last Year: Never true    Ran Out of Food in the Last Year: Never true  Transportation Needs: No Transportation Needs (10/01/2022)   PRAPARE - Administrator, Civil Service (Medical): No    Lack of Transportation (Non-Medical): No  Physical Activity: Sufficiently Active (02/12/2023)   Exercise Vital Sign     Days of Exercise per Week: 5 days    Minutes of Exercise per Session: 60 min  Stress: Stress Concern Present (02/12/2023)   [employer] of Occupational Health - Occupational Stress Questionnaire    Feeling of Stress : To some extent  Social Connections: Moderately Isolated (02/12/2023)   Social Connection and Isolation Panel [NHANES]    Frequency of Communication with Friends and Family: Twice a week    Frequency of Social Gatherings with Friends and Family: Once a week    Attends Religious Services: Never    Database administrator or Organizations: No    Attends Banker Meetings: Never    Marital Status: Married   Past Medical History:  Diagnosis Date   Abnormal weight loss 01/04/2020   Acute respiratory failure with hypoxia (HCC) 10/05/2022   Allergic rhinitis due to allergen 07/03/2021   Aortic aneurysm of unspecified site, without rupture (HCC) 11/26/2022   Aortic atherosclerosis (HCC) 06/25/2021   COPD (chronic obstructive pulmonary disease) (HCC)    COPD exacerbation (HCC) 11/12/2019   Depression    Depression, major, recurrent, mild (HCC) 11/12/2019   Encounter for screening mammogram for malignant neoplasm of breast 10/05/2022   Essential hypertension 11/02/2019   Ex-smoker 11/26/2022   GERD (gastroesophageal reflux disease)    History of nephrolithiasis 02/04/2019   Insomnia    Mild protein-calorie malnutrition (HCC) 11/12/2019   Mixed dyslipidemia 11/02/2019   Mixed hyperlipidemia 11/02/2019   Need for immunization against influenza 04/06/2022   Nodule of left lung 01/04/2020   Osteoporosis    Renal stones 05/2015   Screening for lung cancer 10/05/2022   Shortness of breath 10/05/2022   Family History  Problem Relation Age of Onset   Hypertension Mother    Cancer Father        esophageal   Hypertension Father    Hypertension Maternal Grandmother    Heart attack Maternal Grandmother    Diabetes Paternal Grandmother    Hypertension  Paternal Grandmother    Colon cancer Neg Hx    Pancreatic cancer Neg Hx    Esophageal cancer Neg Hx    Rectal cancer Neg Hx    Stomach cancer Neg Hx     Review of Systems  Constitutional:  Positive for fatigue. Negative for chills and fever.  HENT:  Negative for congestion, rhinorrhea and sore throat.   Respiratory:  Positive for cough and shortness of breath.  Cardiovascular:  Negative for chest pain.  Gastrointestinal:  Negative for abdominal pain, constipation, diarrhea, nausea and vomiting.  Genitourinary:  Negative for dysuria and urgency.  Musculoskeletal:  Positive for back pain. Negative for myalgias.  Neurological:  Negative for dizziness, weakness, light-headedness and headaches.  Psychiatric/Behavioral:  Positive for dysphoric mood. The patient is not nervous/anxious.      Objective:      02/12/2023    2:20 PM 01/14/2023   10:54 AM 01/14/2023   10:42 AM  Vitals with BMI  Height 4\' 10"   5\' 0"   Weight 100 lbs 10 oz  100 lbs  BMI 21.03  19.53  Systolic 160 160 161  Diastolic 100 96 100  Pulse 60  60    No data found.   Physical Exam  Lab Results  Component Value Date   WBC 8.7 01/14/2023   HGB 14.9 01/14/2023   HCT 47.5 (H) 01/14/2023   PLT 284 01/14/2023   GLUCOSE 67 (L) 01/14/2023   CHOL 139 01/14/2023   TRIG 79 01/14/2023   HDL 51 01/14/2023   LDLCALC 73 01/14/2023   ALT 8 01/14/2023   AST 18 01/14/2023   NA 143 01/14/2023   K 4.2 01/14/2023   CL 99 01/14/2023   CREATININE 0.94 01/14/2023   BUN 18 01/14/2023   CO2 28 01/14/2023   TSH 1.730 10/01/2022      Assessment & Plan:   Problem List Items Addressed This Visit   None Visit Diagnoses     Encounter for Medicare annual wellness exam    -  Primary   Encounter for annual wellness exam in Medicare patient            No orders of the defined types were placed in this encounter.   These are the goals we discussed:  Goals      Pharmacy Care Plan     CARE PLAN ENTRY (see  longitudinal plan of care for additional care plan information)  Current Barriers:  Chronic Disease Management support, education, and care coordination needs related to Hypertension, Hyperlipidemia, and COPD   Hypertension BP Readings from Last 3 Encounters:  02/09/20 104/64  01/04/20 110/68  11/03/19 140/90   Pharmacist Clinical Goal(s): Over the next 90 days, patient will work with PharmD and providers to maintain BP goal <140/90 Current regimen:  Hydralazine 100 mg twice daily with food Irbesartan 300 mg daily Propanolol ER 120 mg daily Interventions: Discussed diet and ways to incorporate nutrients while limited appetite.  Patient self care activities - Over the next 90 days, patient will: Check BP if symptomatic, document, and provide at future appointments Ensure daily salt intake < 2300 mg/day  Hyperlipidemia Lab Results  Component Value Date/Time   LDLCALC 92 02/09/2020 11:30 AM   Pharmacist Clinical Goal(s): Over the next 90 days, patient will work with PharmD and providers to maintain LDL goal < 100 Current regimen:  Pravastatin 10 mg daily Aspirin 81 mg daily Interventions: Patient reports good adherence with medication.  Denies current symptoms.  Patient self care activities - Over the next 90 days, patient will: Continue to take medication as prescribed.   COPD  Pharmacist Clinical Goal(s) Over the next 90 days, patient will work with PharmD and providers to manage COPD.  Current regimen:  Breztril 2 puffs twice daily Albuterol inhaler prn  Interventions: Patient reports improved symptom control with Breztri. Receiving from patient assistance.  Pharmacist will coordinate enrollment for 2022 year.  Patient self care activities - Over the  next 90 days, patient will: Reach out to [employer] for smoking cessation help when ready.  Continue using Breztri 2 puffs twice daily.   Medication management Pharmacist Clinical Goal(s): Over the next 90 days,  patient will work with PharmD and providers to maintain optimal medication adherence Current pharmacy: [facility] Interventions Comprehensive medication review performed. Continue current medication management strategy Patient self care activities - Over the next 90 days, patient will: Focus on medication adherence by continuing to use pill box Take medications as prescribed Report any questions or concerns to PharmD and/or provider(s)  Please see past updates related to this goal by clicking on the "Past Updates" button in the selected goal       Quit Smoking         This is a list of the screening recommended for you and due dates:  Health Maintenance  Topic Date Due   Hepatitis C Screening  Never done   Zoster (Shingles) Vaccine (1 of 2) Never done   Flu Shot  01/01/2023   Mammogram  11/05/2023   Colon Cancer Screening  01/03/2024   Medicare Annual Wellness Visit  02/12/2024   Pneumonia Vaccine  Completed   DEXA scan (bone density measurement)  Completed   HPV Vaccine  Aged Out   DTaP/Tdap/Td vaccine  Discontinued   COVID-19 Vaccine  Discontinued   Cologuard (Stool DNA test)  Discontinued     AN INDIVIDUALIZED CARE PLAN: was established or reinforced today.   SELF MANAGEMENT: The patient and I together assessed ways to personally work towards obtaining the recommended goals  Support needs The patient and/or family needs were assessed and services were offered and not necessary at this time.    Follow-up: Return in 1 year (on 02/12/2024).  Renne Crigler, FNP [facility] [phone]

## 2023-02-12 NOTE — Assessment & Plan Note (Signed)
Blood pressure uncontrolled Increase irbesartan to 300 mg daily as patient is already taking this. She will use what she has. I will also recommend Imdur 30 mg whole does instead of the half dose by mouth daily. Continue propanolol ER 120 mg daily. Gave BP log to keep track of her BP's at home for the next 2 weeks and to take it once in the morning and once in the evening after supper. Bring BP cuff to check it in the office for accuracy.  FU in 2 weeks

## 2023-02-12 NOTE — Patient Instructions (Addendum)
Increase water, decrease soda  Increase irbesartan to 300 mg by mouth once per day Increase Imdur to 30 mg by mouth once per day  FU in 2 weeks

## 2023-02-12 NOTE — Progress Notes (Signed)
Subjective:   Misty Evans is a 72 y.o. female who presents for Medicare Annual (Subsequent) preventive examination.  Visit Complete: In person  Patient Medicare AWV questionnaire was completed by the patient; I have confirmed that all information answered by patient is correct and no changes since this date.  Review of Systems    Review of Systems  Constitutional:  Positive for malaise/fatigue. Negative for fever.  HENT:  Negative for congestion, sinus pain and sore throat.   Respiratory:  Positive for shortness of breath.   Cardiovascular:  Negative for chest pain.  Gastrointestinal:  Negative for nausea and vomiting.  Genitourinary:  Negative for dysuria.  Musculoskeletal:  Positive for back pain.  Neurological:  Negative for headaches.  Psychiatric/Behavioral:  Positive for depression.    Physical Exam Constitutional:      General: She is not in acute distress.    Appearance: Normal appearance. She is normal weight.  HENT:     Head: Normocephalic.     Right Ear: Tympanic membrane normal.     Left Ear: Tympanic membrane normal.     Nose: No congestion.     Mouth/Throat:     Mouth: Mucous membranes are moist.  Eyes:     Extraocular Movements: Extraocular movements intact.     Pupils: Pupils are equal, round, and reactive to light.  Cardiovascular:     Rate and Rhythm: Normal rate and regular rhythm.     Heart sounds: Normal heart sounds.  Pulmonary:     Effort: No respiratory distress.     Breath sounds: Normal breath sounds.  Abdominal:     General: Bowel sounds are normal.     Palpations: Abdomen is soft.  Musculoskeletal:        General: Normal range of motion.     Cervical back: Normal range of motion.  Skin:    General: Skin is warm.  Neurological:     Mental Status: She is alert.     Cranial Nerves: Cranial nerves 2-12 are intact.     Motor: No weakness.     Deep Tendon Reflexes:     Reflex Scores:      Patellar reflexes are 2+ on the right  side and 2+ on the left side. Psychiatric:        Mood and Affect: Mood normal.         Objective:    Today's Vitals   02/12/23 1420 02/12/23 1423 02/12/23 1441  BP: (!) 160/100  (!) 148/82  Pulse: 60    Resp: 18    Temp: 98.6 F (37 C)    Weight: 100 lb 9.6 oz (45.6 kg)    Height: 4\' 10"  (1.473 m)    PainSc:  5     Body mass index is 21.03 kg/m.  Urine dipstick shows positive for leukocytes, red blood cells, protein.   Physical Exam Constitutional:      General: She is not in acute distress.    Appearance: Normal appearance. She is normal weight.  HENT:     Head: Normocephalic.     Right Ear: Tympanic membrane normal.     Left Ear: Tympanic membrane normal.     Nose: No congestion.     Mouth/Throat:     Mouth: Mucous membranes are moist.  Eyes:     Extraocular Movements: Extraocular movements intact.     Pupils: Pupils are equal, round, and reactive to light.  Cardiovascular:     Rate and Rhythm: Normal rate and  regular rhythm.     Heart sounds: Normal heart sounds.  Pulmonary:     Effort: No respiratory distress.     Breath sounds: Normal breath sounds.  Abdominal:     General: Bowel sounds are normal.     Palpations: Abdomen is soft.  Musculoskeletal:        General: Normal range of motion.     Cervical back: Normal range of motion.  Skin:    General: Skin is warm.  Neurological:     Mental Status: She is alert.     Cranial Nerves: Cranial nerves 2-12 are intact.     Motor: No weakness.     Deep Tendon Reflexes:     Reflex Scores:      Patellar reflexes are 2+ on the right side and 2+ on the left side. Psychiatric:        Mood and Affect: Mood normal.      12/01/2022    8:08 AM 02/07/2021    3:57 PM  Advanced Directives  Does Patient Have a Medical Advance Directive? No No  Would patient like information on creating a medical advance directive? No - Patient declined Yes (MAU/Ambulatory/Procedural Areas - Information given)    Current Medications  (verified) Outpatient Encounter Medications as of 02/12/2023  Medication Sig   albuterol (ACCUNEB) 1.25 MG/3ML nebulizer solution Take 1 ampule by nebulization every 6 (six) hours as needed for wheezing.   albuterol (VENTOLIN HFA) 108 (90 Base) MCG/ACT inhaler USE 2 INHALATIONS BY MOUTH EVERY 6 HOURS AS NEEDED FOR WHEEZING  OR SHORTNESS OF BREATH   ALPRAZolam (XANAX) 0.25 MG tablet Take 0.5 tablets (0.125 mg total) by mouth at bedtime as needed for anxiety.   aspirin 81 MG chewable tablet Chew 81 mg by mouth daily.   cyanocobalamin (VITAMIN B12) 1000 MCG tablet Take 1,000 mcg by mouth daily.   FLUoxetine (PROZAC) 20 MG capsule Take 1 capsule (20 mg total) by mouth daily.   Fluticasone-Umeclidin-Vilant (TRELEGY ELLIPTA) 100-62.5-25 MCG/ACT AEPB Inhale 1 puff into the lungs daily.   ibuprofen (ADVIL) 200 MG tablet Take 800 mg by mouth every 8 (eight) hours as needed for moderate pain.   irbesartan (AVAPRO) 150 MG tablet Take 1 tablet (150 mg total) by mouth daily.   isosorbide mononitrate (IMDUR) 30 MG 24 hr tablet Take 0.5 tablets (15 mg total) by mouth daily. (Patient not taking: Reported on 01/14/2023)   nitroGLYCERIN (NITROSTAT) 0.4 MG SL tablet Place 0.4 mg under the tongue every 5 (five) minutes as needed for chest pain.   OXYGEN Inhale 2 L into the lungs as needed (shortness of breath).   pravastatin (PRAVACHOL) 20 MG tablet Take 1 tablet (20 mg total) by mouth daily.   propranolol ER (INDERAL LA) 120 MG 24 hr capsule TAKE 1 CAPSULE BY MOUTH DAILY   No facility-administered encounter medications on file as of 02/12/2023.    Allergies (verified) Penicillin g and Sulfamethoxazole   History: Past Medical History:  Diagnosis Date   Abnormal weight loss 01/04/2020   Acute respiratory failure with hypoxia (HCC) 10/05/2022   Allergic rhinitis due to allergen 07/03/2021   Aortic aneurysm of unspecified site, without rupture (HCC) 11/26/2022   Aortic atherosclerosis (HCC) 06/25/2021   COPD  (chronic obstructive pulmonary disease) (HCC)    COPD exacerbation (HCC) 11/12/2019   Depression    Depression, major, recurrent, mild (HCC) 11/12/2019   Encounter for screening mammogram for malignant neoplasm of breast 10/05/2022   Essential hypertension 11/02/2019   Ex-smoker 11/26/2022  GERD (gastroesophageal reflux disease)    History of nephrolithiasis 02/04/2019   Insomnia    Mild protein-calorie malnutrition (HCC) 11/12/2019   Mixed dyslipidemia 11/02/2019   Mixed hyperlipidemia 11/02/2019   Need for immunization against influenza 04/06/2022   Nodule of left lung 01/04/2020   Osteoporosis    Renal stones 05/2015   Screening for lung cancer 10/05/2022   Shortness of breath 10/05/2022   Past Surgical History:  Procedure Laterality Date   ABDOMINAL HYSTERECTOMY     APPENDECTOMY     BACK SURGERY     x2   CHOLECYSTECTOMY     COLONOSCOPY     LEFT HEART CATH AND CORONARY ANGIOGRAPHY N/A 12/01/2022   Procedure: LEFT HEART CATH AND CORONARY ANGIOGRAPHY;  Surgeon: Yvonne Kendall, MD;  Location: [facility];  Service: Cardiovascular;  Laterality: N/A;   Family History  Problem Relation Age of Onset   Hypertension Mother    Cancer Father        esophageal   Hypertension Father    Hypertension Maternal Grandmother    Heart attack Maternal Grandmother    Diabetes Paternal Grandmother    Hypertension Paternal Grandmother    Colon cancer Neg Hx    Pancreatic cancer Neg Hx    Esophageal cancer Neg Hx    Rectal cancer Neg Hx    Stomach cancer Neg Hx    Social History   Socioeconomic History   Marital status: Married    Spouse name: Charles   Number of children: 2   Years of education: Not on file   Highest education level: Not on file  Occupational History   Occupation: Retired  Tobacco Use   Smoking status: Former    Current packs/day: 0.00    Average packs/day: 0.3 packs/day for 54.0 years (13.5 ttl pk-yrs)    Types: Cigarettes    Start date: 03/04/1967     Quit date: 03/03/2021    Years since quitting: 1.9   Smokeless tobacco: Never   Tobacco comments:    Has smoked since 72 years old  Vaping Use   Vaping status: Some Days  Substance and Sexual Activity   Alcohol use: Not Currently   Drug use: Never   Sexual activity: Not on file  Other Topics Concern   Not on file  Social History Narrative   Not on file   Social Determinants of Health   Financial Resource Strain: Low Risk  (02/12/2023)   Overall Financial Resource Strain (CARDIA)    Difficulty of Paying Living Expenses: Not hard at all  Food Insecurity: No Food Insecurity (02/12/2023)   Hunger Vital Sign    Worried About Running Out of Food in the Last Year: Never true    Ran Out of Food in the Last Year: Never true  Transportation Needs: No Transportation Needs (10/01/2022)   PRAPARE - Administrator, Civil Service (Medical): No    Lack of Transportation (Non-Medical): No  Physical Activity: Sufficiently Active (02/12/2023)   Exercise Vital Sign    Days of Exercise per Week: 5 days    Minutes of Exercise per Session: 60 min  Stress: Stress Concern Present (02/12/2023)   [employer] of Occupational Health - Occupational Stress Questionnaire    Feeling of Stress : To some extent  Social Connections: Moderately Isolated (02/12/2023)   Social Connection and Isolation Panel [NHANES]    Frequency of Communication with Friends and Family: Twice a week    Frequency of Social Gatherings with Friends  and Family: Once a week    Attends Religious Services: Never    Active Member of Clubs or Organizations: No    Attends Engineer, structural: Never    Marital Status: Married    Tobacco Counseling Counseling given: Not Answered Tobacco comments: Has smoked since 72 years old   Clinical Intake:  Pre-visit preparation completed: Yes  Pain : No/denies pain Pain Score: 5  (back pain) Pain Type: Chronic pain Pain Location: Back Pain Descriptors / Indicators:  Aching Pain Onset: More than a month ago Pain Frequency: Constant Pain Relieving Factors: Ibuprofen Effect of Pain on Daily Activities: has to pace herself  Pain Relieving Factors: Ibuprofen  BMI - recorded: 21.03 Nutritional Status: BMI of 19-24  Normal Nutritional Risks: Unintentional weight loss Diabetes: No  How often do you need to have someone help you when you read instructions, pamphlets, or other written materials from your doctor or pharmacy?: 1 - Never What is the last grade level you completed in school?: 12  Interpreter Needed?: No      Activities of Daily Living    10/01/2022   10:38 AM  In your present state of health, do you have any difficulty performing the following activities:  Hearing? 0  Vision? 0  Difficulty concentrating or making decisions? 0  Walking or climbing stairs? 0  Dressing or bathing? 0  Doing errands, shopping? 0    Patient Care Team: Blane Ohara, MD as PCP - General (Family Medicine) Earvin Hansen, [facility] (Inactive) as Pharmacist (Pharmacist) Lynann Bologna, MD as Consulting Physician (Gastroenterology) Tomma Lightning, MD as Consulting Physician (Pulmonary Disease) Revankar, Aundra Dubin, MD as Consulting Physician (Cardiology) Alleen Borne, MD as Consulting Physician (Cardiothoracic Surgery)  Indicate any recent Medical Services you may have received from other than [facility] providers in the past year (date may be approximate).     Assessment:   This is a routine wellness examination for Laury.  Hearing/Vision screen No results found.  Depression Screen    01/14/2023   10:49 AM 10/01/2022   11:14 AM 10/01/2022   10:37 AM 04/04/2022   11:31 AM 09/24/2021    8:06 AM 02/07/2021    3:57 PM 09/24/2020   11:37 AM  PHQ 2/9 Scores  PHQ - 2 Score 2 2 0 1 1 1 1   PHQ- 9 Score 7 7  4 3  9     Fall Risk    01/14/2023   10:49 AM 10/01/2022   10:37 AM 09/24/2021    8:05 AM 02/07/2021    3:57 PM 09/26/2020    8:54 PM  Fall Risk   Falls in the  past year? 0 0 1 0   Number falls in past yr: 0 0 0 0   Injury with Fall? 0 0 0 0   Risk for fall due to : No Fall Risks No Fall Risks  No Fall Risks No Fall Risks  Follow up Falls evaluation completed;Falls prevention discussed Falls evaluation completed  Falls evaluation completed;Education provided Falls evaluation completed    MEDICARE RISK AT HOME:   None  TIMED UP AND GO:  Was the test performed?  Yes  Length of time to ambulate 10 feet: 9 sec Gait steady and fast without use of assistive device    Cognitive Function:        02/07/2021    3:59 PM  6CIT Screen  What Year? 0 points  What month? 0 points  What time? 0 points  Count  back from 20 2 points  Months in reverse 4 points  Repeat phrase 2 points  Total Score 8 points    Immunizations Immunization History  Administered Date(s) Administered   Fluad Quad(high Dose 65+) 02/09/2020, 03/19/2021, 04/04/2022   Influenza-Unspecified 02/07/2016, 02/17/2017, 02/08/2018, 01/28/2019   Pneumococcal Conjugate-13 02/07/2016   Pneumococcal Polysaccharide-23 03/10/2014, 02/09/2020      Screening Tests Health Maintenance  Topic Date Due   Hepatitis C Screening  Never done   Zoster Vaccines- Shingrix (1 of 2) Never done   INFLUENZA VACCINE  01/01/2023   MAMMOGRAM  11/05/2023   Colonoscopy  01/03/2024   Medicare Annual Wellness (AWV)  02/12/2024   Pneumonia Vaccine 86+ Years old  Completed   DEXA SCAN  Completed   HPV VACCINES  Aged Out   DTaP/Tdap/Td  Discontinued   COVID-19 Vaccine  Discontinued   Fecal DNA (Cologuard)  Discontinued    Health Maintenance  Health Maintenance Due  Topic Date Due   Hepatitis C Screening  Never done   Zoster Vaccines- Shingrix (1 of 2) Never done   INFLUENZA VACCINE  01/01/2023    Lung Cancer Screening: (Low Dose CT Chest recommended if Age 37-80 years, 20 pack-year currently smoking OR have quit w/in 15years.) does qualify.  Lung Cancer Screening Referral: patient states that  she recently had one done.   Additional Screening:  Hepatitis C Screening: declined Vision Screening: Recommended annual ophthalmology exams for early detection of glaucoma and other disorders of the eye. Is the patient up to date with their annual eye exam?  No , last eye exam 2 years ago  Dental Screening: Recommended annual dental exams for proper oral hygiene   Community Resource Referral / Chronic Care Management: CRR required this visit?  No   CCM required this visit?  No    Plan:   Acute cystitis with hematuria UA - positive Urine culture sent Increase water, decrease soda Try OTC AZO for symptom management Macrobid 100 mg by mouth TWICE A DAY  FU in 2 weeks, call if symptoms get worse   Encounter for Medicare annual wellness exam Flu vaccine given today Declined Hep C vaccine Eye exam to be scheduled  Cardiologist appointment scheduled 02/19/23 BP elevated, FU in 2 weeks  Essential hypertension Blood pressure uncontrolled Increase irbesartan to 300 mg daily as patient is already taking this. She will use what she has. I will also recommend Imdur 30 mg whole does instead of the half dose by mouth daily. Continue propanolol ER 120 mg daily. Gave BP log to keep track of her BP's at home for the next 2 weeks and to take it once in the morning and once in the evening after supper. Bring BP cuff to check it in the office for accuracy.  FU in 2 weeks   I have personally reviewed and noted the following in the patient's chart:   Medical and social history Use of alcohol, tobacco or illicit drugs  Current medications and supplements including opioid prescriptions. Patient is not currently taking opioid prescriptions. Functional ability and status Nutritional status Physical activity Advanced directives List of other physicians Hospitalizations, surgeries, and ER visits in previous 12 months Vitals Screenings to include cognitive, depression, and falls Referrals and  appointments  In addition, I have reviewed and discussed with patient certain preventive protocols, quality metrics, and best practice recommendations. A written personalized care plan for preventive services as well as general preventive health recommendations were provided to patient.   Lajuana Matte, FNP  [facility] [employer] [phone]

## 2023-02-14 LAB — URINE CULTURE

## 2023-02-18 ENCOUNTER — Telehealth: Payer: Self-pay

## 2023-02-18 ENCOUNTER — Other Ambulatory Visit: Payer: Self-pay

## 2023-02-18 DIAGNOSIS — M810 Age-related osteoporosis without current pathological fracture: Secondary | ICD-10-CM

## 2023-02-18 NOTE — Telephone Encounter (Signed)
Left message for patient with appointment information:  You have been scheduled for a bone density test at [facility] on 03/12/23 checking in at 10:00 am at the outpatient center.    Preparing for your Bone Density Analysis (DEXA)  A bone density analysis is a painless and quick way to measure your bone mass. The test will take 10-15 minutes.  Wear comfortable clothing. You may not be required to undress as long as there are no metal fasteners such as metal zippers, buttons, hooks or snaps on your clothing.  Please do not take any calcium supplements or multivitamins for 24 hours prior to your test.  If you need to change the date or time of this appointment please call the scheduling department at [phone] and choose option 7.

## 2023-02-19 DIAGNOSIS — J449 Chronic obstructive pulmonary disease, unspecified: Secondary | ICD-10-CM | POA: Diagnosis not present

## 2023-02-19 DIAGNOSIS — Z87891 Personal history of nicotine dependence: Secondary | ICD-10-CM | POA: Diagnosis not present

## 2023-02-26 ENCOUNTER — Ambulatory Visit: Payer: Medicare Other

## 2023-03-03 ENCOUNTER — Ambulatory Visit: Payer: Medicare Other

## 2023-03-10 ENCOUNTER — Other Ambulatory Visit: Payer: Self-pay | Admitting: Family Medicine

## 2023-03-10 DIAGNOSIS — I10 Essential (primary) hypertension: Secondary | ICD-10-CM

## 2023-03-19 DIAGNOSIS — R911 Solitary pulmonary nodule: Secondary | ICD-10-CM | POA: Diagnosis not present

## 2023-03-19 DIAGNOSIS — J449 Chronic obstructive pulmonary disease, unspecified: Secondary | ICD-10-CM | POA: Diagnosis not present

## 2023-03-19 DIAGNOSIS — Z87891 Personal history of nicotine dependence: Secondary | ICD-10-CM | POA: Diagnosis not present

## 2023-03-25 ENCOUNTER — Other Ambulatory Visit: Payer: Self-pay | Admitting: Family Medicine

## 2023-04-23 DIAGNOSIS — R911 Solitary pulmonary nodule: Secondary | ICD-10-CM | POA: Diagnosis not present

## 2023-04-23 DIAGNOSIS — Z87891 Personal history of nicotine dependence: Secondary | ICD-10-CM | POA: Diagnosis not present

## 2023-04-23 DIAGNOSIS — J449 Chronic obstructive pulmonary disease, unspecified: Secondary | ICD-10-CM | POA: Diagnosis not present

## 2023-05-05 ENCOUNTER — Other Ambulatory Visit: Payer: Self-pay | Admitting: Family Medicine

## 2023-05-05 DIAGNOSIS — J432 Centrilobular emphysema: Secondary | ICD-10-CM

## 2023-05-06 NOTE — Progress Notes (Unsigned)
Subjective:  Patient ID: Misty Evans, female    DOB: Sep 23, 1950  Age: 72 y.o. MRN: [id number]  Chief Complaint  Patient presents with   Medical Management of Chronic Issues    HPI   Hyperlipidemia/Aortic atherosclerosis:  Patient is currently taking Pravastatin 10 mg take 1 tablet daily.   Hypertension: Patient is currently on irbesartan 75 mg daily, propranolol er 120 mg once daily.  (She has been taking an addition irbesartan 75 mg at bedtime since her bp is running high).  Patient was given isosorbide 30 mg and recommended a half a pill daily.     Depression:  Patient is currently on prozac 20 mg once daily.  Patient was given low-dose Xanax by Dr. Tomie China.  #10 due to her significant anxiety.   COPD:  Patient has dyspnea on exertion. Has been using albuterol HFA 2 puffs prior to activity. Using oxygen @ 2L/min.  Trelegy helped the most. Failed breztri and spiriva.       02/12/2023    2:32 PM 01/14/2023   10:49 AM 10/01/2022   11:14 AM 10/01/2022   10:37 AM 04/04/2022   11:31 AM  Depression screen PHQ 2/9  Decreased Interest  1 1 0 0  Down, Depressed, Hopeless  1 1 0 1  PHQ - 2 Score  2 2 0 1  Altered sleeping 1 1 0  1  Tired, decreased energy 1 3 3  1   Change in appetite 1 1 1   0  Feeling bad or failure about yourself  0 0 1  1  Trouble concentrating 0 0 0  0  Moving slowly or fidgety/restless 0 0 0  0  Suicidal thoughts 0 0 0  0  PHQ-9 Score  7 7  4   Difficult doing work/chores   Somewhat difficult  Somewhat difficult        01/14/2023   10:49 AM  Fall Risk   Falls in the past year? 0  Number falls in past yr: 0  Injury with Fall? 0  Risk for fall due to : No Fall Risks  Follow up Falls evaluation completed;Falls prevention discussed    Patient Care Team: Blane Ohara, MD as PCP - General (Family Medicine) Earvin Hansen, [facility] (Inactive) as Pharmacist (Pharmacist) Lynann Bologna, MD as Consulting Physician (Gastroenterology) Tomma Lightning, MD as  Consulting Physician (Pulmonary Disease) Revankar, Aundra Dubin, MD as Consulting Physician (Cardiology) Alleen Borne, MD as Consulting Physician (Cardiothoracic Surgery)   Review of Systems  Current Outpatient Medications on File Prior to Visit  Medication Sig Dispense Refill   albuterol (ACCUNEB) 1.25 MG/3ML nebulizer solution Take 1 ampule by nebulization every 6 (six) hours as needed for wheezing.     albuterol (VENTOLIN HFA) 108 (90 Base) MCG/ACT inhaler USE 2 INHALATIONS BY MOUTH EVERY 6 HOURS AS NEEDED FOR WHEEZING  OR SHORTNESS OF BREATH 26.8 g 2   ALPRAZolam (XANAX) 0.25 MG tablet Take 0.5 tablets (0.125 mg total) by mouth at bedtime as needed for anxiety. 10 tablet 0   aspirin 81 MG chewable tablet Chew 81 mg by mouth daily.     cyanocobalamin (VITAMIN B12) 1000 MCG tablet Take 1,000 mcg by mouth daily.     FLUoxetine (PROZAC) 20 MG capsule Take 1 capsule (20 mg total) by mouth daily. 90 capsule 1   Fluticasone-Umeclidin-Vilant (TRELEGY ELLIPTA) 100-62.5-25 MCG/ACT AEPB Inhale 1 puff into the lungs daily. (Patient not taking: Reported on 02/12/2023) 3 each 3   ibuprofen (ADVIL) 200 MG  tablet Take 800 mg by mouth every 8 (eight) hours as needed for moderate pain.     irbesartan (AVAPRO) 150 MG tablet TAKE 1 TABLET BY MOUTH DAILY 90 tablet 1   isosorbide mononitrate (IMDUR) 30 MG 24 hr tablet Take 0.5 tablets (15 mg total) by mouth daily. 30 tablet 5   nitroGLYCERIN (NITROSTAT) 0.4 MG SL tablet Place 0.4 mg under the tongue every 5 (five) minutes as needed for chest pain.     OXYGEN Inhale 2 L into the lungs as needed (shortness of breath).     pravastatin (PRAVACHOL) 20 MG tablet TAKE 1 TABLET BY MOUTH DAILY 100 tablet 2   propranolol ER (INDERAL LA) 120 MG 24 hr capsule TAKE 1 CAPSULE BY MOUTH DAILY 100 capsule 2   No current facility-administered medications on file prior to visit.   Past Medical History:  Diagnosis Date   Abnormal weight loss 01/04/2020   Acute respiratory  failure with hypoxia (HCC) 10/05/2022   Allergic rhinitis due to allergen 07/03/2021   Aortic aneurysm of unspecified site, without rupture (HCC) 11/26/2022   Aortic atherosclerosis (HCC) 06/25/2021   COPD (chronic obstructive pulmonary disease) (HCC)    COPD exacerbation (HCC) 11/12/2019   Depression    Depression, major, recurrent, mild (HCC) 11/12/2019   Encounter for screening mammogram for malignant neoplasm of breast 10/05/2022   Essential hypertension 11/02/2019   Ex-smoker 11/26/2022   GERD (gastroesophageal reflux disease)    History of nephrolithiasis 02/04/2019   Insomnia    Mild protein-calorie malnutrition (HCC) 11/12/2019   Mixed dyslipidemia 11/02/2019   Mixed hyperlipidemia 11/02/2019   Need for immunization against influenza 04/06/2022   Nodule of left lung 01/04/2020   Osteoporosis    Renal stones 05/2015   Screening for lung cancer 10/05/2022   Shortness of breath 10/05/2022   Past Surgical History:  Procedure Laterality Date   ABDOMINAL HYSTERECTOMY     APPENDECTOMY     BACK SURGERY     x2   CHOLECYSTECTOMY     COLONOSCOPY     LEFT HEART CATH AND CORONARY ANGIOGRAPHY N/A 12/01/2022   Procedure: LEFT HEART CATH AND CORONARY ANGIOGRAPHY;  Surgeon: Yvonne Kendall, MD;  Location: [facility];  Service: Cardiovascular;  Laterality: N/A;    Family History  Problem Relation Age of Onset   Hypertension Mother    Cancer Father        esophageal   Hypertension Father    Hypertension Maternal Grandmother    Heart attack Maternal Grandmother    Diabetes Paternal Grandmother    Hypertension Paternal Grandmother    Colon cancer Neg Hx    Pancreatic cancer Neg Hx    Esophageal cancer Neg Hx    Rectal cancer Neg Hx    Stomach cancer Neg Hx    Social History   Socioeconomic History   Marital status: Married    Spouse name: Charles   Number of children: 2   Years of education: Not on file   Highest education level: Not on file  Occupational History    Occupation: Retired  Tobacco Use   Smoking status: Former    Current packs/day: 0.00    Average packs/day: 0.3 packs/day for 54.0 years (13.5 ttl pk-yrs)    Types: Cigarettes    Start date: 03/04/1967    Quit date: 03/03/2021    Years since quitting: 2.1   Smokeless tobacco: Never   Tobacco comments:    Has smoked since 72 years old  Vaping Use  Vaping status: Some Days  Substance and Sexual Activity   Alcohol use: Not Currently   Drug use: Never   Sexual activity: Not on file  Other Topics Concern   Not on file  Social History Narrative   Not on file   Social Determinants of Health   Financial Resource Strain: Low Risk  (02/12/2023)   Overall Financial Resource Strain (CARDIA)    Difficulty of Paying Living Expenses: Not hard at all  Food Insecurity: No Food Insecurity (02/12/2023)   Hunger Vital Sign    Worried About Running Out of Food in the Last Year: Never true    Ran Out of Food in the Last Year: Never true  Transportation Needs: No Transportation Needs (10/01/2022)   PRAPARE - Administrator, Civil Service (Medical): No    Lack of Transportation (Non-Medical): No  Physical Activity: Sufficiently Active (02/12/2023)   Exercise Vital Sign    Days of Exercise per Week: 5 days    Minutes of Exercise per Session: 60 min  Stress: Stress Concern Present (02/12/2023)   [employer] of Occupational Health - Occupational Stress Questionnaire    Feeling of Stress : To some extent  Social Connections: Moderately Isolated (02/12/2023)   Social Connection and Isolation Panel [NHANES]    Frequency of Communication with Friends and Family: Twice a week    Frequency of Social Gatherings with Friends and Family: Once a week    Attends Religious Services: Never    Database administrator or Organizations: No    Attends Banker Meetings: Never    Marital Status: Married    Objective:  There were no vitals taken for this visit.     02/12/2023    2:41  PM 02/12/2023    2:20 PM 01/14/2023   10:54 AM  BP/Weight  Systolic BP 148 160 160  Diastolic BP 82 100 96  Wt. (Lbs)  100.6   BMI  21.03 kg/m2     Physical Exam  Diabetic Foot Exam - Simple   No data filed      Lab Results  Component Value Date   WBC 8.7 01/14/2023   HGB 14.9 01/14/2023   HCT 47.5 (H) 01/14/2023   PLT 284 01/14/2023   GLUCOSE 67 (L) 01/14/2023   CHOL 139 01/14/2023   TRIG 79 01/14/2023   HDL 51 01/14/2023   LDLCALC 73 01/14/2023   ALT 8 01/14/2023   AST 18 01/14/2023   NA 143 01/14/2023   K 4.2 01/14/2023   CL 99 01/14/2023   CREATININE 0.94 01/14/2023   BUN 18 01/14/2023   CO2 28 01/14/2023   TSH 1.730 10/01/2022      Assessment & Plan:    Essential hypertension  Gastroesophageal reflux disease without esophagitis  Mixed hyperlipidemia     No orders of the defined types were placed in this encounter.   No orders of the defined types were placed in this encounter.    Follow-up: No follow-ups on file.   I,Taichi Repka I Leal-Borjas,acting as a scribe for Blane Ohara, MD.,have documented all relevant documentation on the behalf of Blane Ohara, MD,as directed by  Blane Ohara, MD while in the presence of Blane Ohara, MD.   An After Visit Summary was printed and given to the patient.  Blane Ohara, MD [facility] [phone]

## 2023-05-07 ENCOUNTER — Encounter: Payer: Medicare Other | Admitting: Family Medicine

## 2023-05-07 DIAGNOSIS — K219 Gastro-esophageal reflux disease without esophagitis: Secondary | ICD-10-CM

## 2023-05-07 DIAGNOSIS — E782 Mixed hyperlipidemia: Secondary | ICD-10-CM

## 2023-05-07 DIAGNOSIS — I10 Essential (primary) hypertension: Secondary | ICD-10-CM

## 2023-05-09 NOTE — Progress Notes (Signed)
No show. Dr. Sedalia Muta

## 2023-05-22 ENCOUNTER — Other Ambulatory Visit: Payer: Self-pay | Admitting: Family Medicine

## 2023-05-22 DIAGNOSIS — I10 Essential (primary) hypertension: Secondary | ICD-10-CM

## 2023-06-18 DIAGNOSIS — Z87891 Personal history of nicotine dependence: Secondary | ICD-10-CM | POA: Diagnosis not present

## 2023-06-18 DIAGNOSIS — R911 Solitary pulmonary nodule: Secondary | ICD-10-CM | POA: Diagnosis not present

## 2023-06-18 DIAGNOSIS — J449 Chronic obstructive pulmonary disease, unspecified: Secondary | ICD-10-CM | POA: Diagnosis not present

## 2023-06-26 ENCOUNTER — Other Ambulatory Visit: Payer: Self-pay | Admitting: Family Medicine

## 2023-06-26 DIAGNOSIS — F330 Major depressive disorder, recurrent, mild: Secondary | ICD-10-CM

## 2023-07-31 ENCOUNTER — Other Ambulatory Visit: Payer: Self-pay | Admitting: Family Medicine

## 2023-07-31 DIAGNOSIS — I10 Essential (primary) hypertension: Secondary | ICD-10-CM

## 2023-08-04 ENCOUNTER — Other Ambulatory Visit: Payer: Self-pay | Admitting: Family Medicine

## 2023-08-04 DIAGNOSIS — F330 Major depressive disorder, recurrent, mild: Secondary | ICD-10-CM

## 2023-08-04 NOTE — Telephone Encounter (Signed)
 Last Fill: 06/26/23- pt would like 30 day script sent to [facility] vs the 90 sent to [facility]  Last OV: 02/12/23 AWV Next OV: 08/06/23  Routing to provider for review/authorization.

## 2023-08-04 NOTE — Telephone Encounter (Signed)
 Copied from CRM [id number]. Topic: Clinical - Medication Refill >> Aug 04, 2023 10:24 AM Shon Hale wrote: Most Recent Primary Care Visit:  Provider: Renne Crigler  Department: [facility]  Visit Type: MEDICARE AWV, SEQUENTIAL  Date: 02/12/2023  Medication: FLUoxetine (PROZAC) 20 MG capsule Requesting 30 day supply to pharmacy below.   Has the patient contacted their pharmacy? Yes  Is this the correct pharmacy for this prescription? Yes This is the patient's preferred pharmacy:  [facility] [address], [place] - [address] [facility] [place] [address] Phone: [phone] Fax: [phone]   Has the prescription been filled recently? No  Is the patient out of the medication? Yes  Has the patient been seen for an appointment in the last year OR does the patient have an upcoming appointment? Yes  Can we respond through MyChart? No  Agent: Please be advised that Rx refills may take up to 3 business days. We ask that you follow-up with your pharmacy.

## 2023-08-05 NOTE — Progress Notes (Unsigned)
 Acute Office Visit  Subjective:    Patient ID: Misty Evans, female    DOB: 04/05/1951, 73 y.o.   MRN: [id number]  Chief Complaint  Patient presents with   Medical Management of Chronic Issues   Discussed the use of AI scribe software for clinical note transcription with the patient, who gave verbal consent to proceed.  History of Present Illness  The patient, with a history of respiratory issues and currently on oxygen therapy, presents with a persistent runny nose and headaches for over a month. The patient believes these symptoms may be sinus-related. Despite trying various over-the-counter allergy medications, the patient reports no relief from the runny nose. The patient also mentions experiencing hot flashes, which she thought were long gone. 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 The patient's husband recently had a severe health scare with double pneumonia and a UTI, and his health has significantly declined since then. The patient is managing her respiratory issues with albuterol via a nebulizer, but reports that she has not been able to afford the inhalers that have previously helped her.        HPI:  Hyperlipidemia/Aortic atherosclerosis:  Patient is currently taking Pravastatin 20 mg take 1 tablet daily.   Hypertension: Patient saw cardiology, Dr. Tomie China.  Left heart cath showed mild to moderate nonobstructive coronary arteries.  Patient does have a thoracic aneurysm.  She saw a vascular surgeon, Dr. Evelene Croon, on July 31.  On CTA.  of the chest on 11/11/2022 showed extensive calcific atherosclerosis of the entire aorta including the ascending, arch, and descending aorta. There was a 1.9 x 1.8 x 1.1 cm focal aneurysm of the inferior aortic arch just beyond the origin of the left subclavian artery. Previous CT scans of the chest dating back to 2021 showed that  this was present but may be slightly larger than when it was first noticed.  Dr. Laneta Simmers recommended follow-up in 1 year with him and repeat CTA scan of her chest.  Patient is a poor surgical candidate due to her severe COPD with hypoxemia.  He is currently recommended good blood pressure control. Patient is currently on irbesartan 75 mg daily, propranolol er 120 mg once daily.  (She has been taking an addition irbesartan 75 mg at bedtime since her bp is running high).  Patient was given isosorbide 30 mg and recommended a half a pill daily.  She has not started this.  I count this is because she had some mild coronary artery disease.   Depression:  Patient is currently on prozac 20 mg once daily.  Patient was given low-dose Xanax by Dr. Tomie China.  #10 due to her significant anxiety.   COPD:  Patient has dyspnea on exertion. Has been using albuterol HFA 2 puffs prior to activity. Using oxygen @ 2L/min.  Trelegy helped the most. Failed breztri and spiriva.  Patient also states she is having some hot flashes and headaches. Past Medical History:  Diagnosis Date   Abnormal weight loss 01/04/2020   Acute respiratory failure with hypoxia (HCC) 10/05/2022   Allergic rhinitis due to allergen 07/03/2021   Aortic aneurysm of unspecified site, without rupture (HCC) 11/26/2022   Aortic atherosclerosis (HCC) 06/25/2021   COPD (chronic obstructive pulmonary disease) (HCC)    COPD exacerbation (HCC) 11/12/2019   Depression    Depression, major, recurrent, mild (HCC) 11/12/2019   Encounter for screening mammogram for malignant neoplasm  of breast 10/05/2022   Essential hypertension 11/02/2019   Ex-smoker 11/26/2022   GERD (gastroesophageal reflux disease)    History of nephrolithiasis 02/04/2019   Insomnia    Mild protein-calorie malnutrition (HCC) 11/12/2019   Mixed dyslipidemia 11/02/2019   Mixed hyperlipidemia 11/02/2019   Need for immunization against influenza 04/06/2022   Nodule of left lung  01/04/2020   Osteoporosis    Renal stones 05/2015   Screening for lung cancer 10/05/2022   Shortness of breath 10/05/2022    Past Surgical History:  Procedure Laterality Date   ABDOMINAL HYSTERECTOMY     APPENDECTOMY     BACK SURGERY     x 2   CHOLECYSTECTOMY     COLONOSCOPY     LEFT HEART CATH AND CORONARY ANGIOGRAPHY N/A 12/01/2022   Procedure: LEFT HEART CATH AND CORONARY ANGIOGRAPHY;  Surgeon: Yvonne Kendall, MD;  Location: [facility];  Service: Cardiovascular;  Laterality: N/A;    Family History  Problem Relation Age of Onset   Hypertension Mother    Cancer Father        esophageal   Hypertension Father    Hypertension Maternal Grandmother    Heart attack Maternal Grandmother    Diabetes Paternal Grandmother    Hypertension Paternal Grandmother    Colon cancer Neg Hx    Pancreatic cancer Neg Hx    Esophageal cancer Neg Hx    Rectal cancer Neg Hx    Stomach cancer Neg Hx     Social History   Socioeconomic History   Marital status: Married    Spouse name: Charles   Number of children: 2   Years of education: Not on file   Highest education level: Not on file  Occupational History   Occupation: Retired  Tobacco Use   Smoking status: Former    Current packs/day: 0.00    Average packs/day: 0.3 packs/day for 54.0 years (13.5 ttl pk-yrs)    Types: Cigarettes    Start date: 03/04/1967    Quit date: 03/03/2021    Years since quitting: 2.4   Smokeless tobacco: Never   Tobacco comments:    Has smoked since 73 years old  Vaping Use   Vaping status: Some Days  Substance and Sexual Activity   Alcohol use: Not Currently   Drug use: Never   Sexual activity: Not on file  Other Topics Concern   Not on file  Social History Narrative   Not on file   Social Drivers of Health   Financial Resource Strain: Low Risk  (02/12/2023)   Overall Financial Resource Strain (CARDIA)    Difficulty of Paying Living Expenses: Not hard at all  Food Insecurity: No Food  Insecurity (08/06/2023)   Hunger Vital Sign    Worried About Running Out of Food in the Last Year: Never true    Ran Out of Food in the Last Year: Never true  Transportation Needs: No Transportation Needs (08/06/2023)   PRAPARE - Administrator, Civil Service (Medical): No    Lack of Transportation (Non-Medical): No  Physical Activity: Sufficiently Active (02/12/2023)   Exercise Vital Sign    Days of Exercise per Week: 5 days    Minutes of Exercise per Session: 60 min  Stress: Stress Concern Present (02/12/2023)   [employer] of Occupational Health - Occupational Stress Questionnaire    Feeling of Stress : To some extent  Social Connections: Moderately Isolated (02/12/2023)   Social Connection and Isolation Panel [NHANES]  Frequency of Communication with Friends and Family: Twice a week    Frequency of Social Gatherings with Friends and Family: Once a week    Attends Religious Services: Never    Database administrator or Organizations: No    Attends Banker Meetings: Never    Marital Status: Married  Catering manager Violence: Not At Risk (08/06/2023)   Humiliation, Afraid, Rape, and Kick questionnaire    Fear of Current or Ex-Partner: No    Emotionally Abused: No    Physically Abused: No    Sexually Abused: No    Outpatient Medications Prior to Visit  Medication Sig Dispense Refill   albuterol (ACCUNEB) 1.25 MG/3ML nebulizer solution Take 1 ampule by nebulization every 6 (six) hours as needed for wheezing.     ALPRAZolam (XANAX) 0.25 MG tablet Take 0.5 tablets (0.125 mg total) by mouth at bedtime as needed for anxiety. 10 tablet 0   aspirin 81 MG chewable tablet Chew 81 mg by mouth daily.     irbesartan (AVAPRO) 150 MG tablet TAKE 1 TABLET BY MOUTH DAILY 100 tablet 2   nitroGLYCERIN (NITROSTAT) 0.4 MG SL tablet Place 0.4 mg under the tongue every 5 (five) minutes as needed for chest pain.     OXYGEN Inhale 2 L into the lungs as needed (shortness of  breath).     pravastatin (PRAVACHOL) 20 MG tablet TAKE 1 TABLET BY MOUTH DAILY 100 tablet 2   propranolol ER (INDERAL LA) 120 MG 24 hr capsule TAKE 1 CAPSULE BY MOUTH DAILY 100 capsule 2   albuterol (VENTOLIN HFA) 108 (90 Base) MCG/ACT inhaler USE 2 INHALATIONS BY MOUTH EVERY 6 HOURS AS NEEDED FOR WHEEZING  OR SHORTNESS OF BREATH 26.8 g 2   cyanocobalamin (VITAMIN B12) 1000 MCG tablet Take 1,000 mcg by mouth daily.     FLUoxetine (PROZAC) 20 MG capsule TAKE 1 CAPSULE BY MOUTH DAILY 90 capsule 3   Fluticasone-Umeclidin-Vilant (TRELEGY ELLIPTA) 100-62.5-25 MCG/ACT AEPB Inhale 1 puff into the lungs daily. (Patient not taking: Reported on 02/12/2023) 3 each 3   ibuprofen (ADVIL) 200 MG tablet Take 800 mg by mouth every 8 (eight) hours as needed for moderate pain.     isosorbide mononitrate (IMDUR) 30 MG 24 hr tablet Take 0.5 tablets (15 mg total) by mouth daily. 30 tablet 5   No facility-administered medications prior to visit.    Allergies  Allergen Reactions   Penicillin G Rash   Sulfamethoxazole Rash    Review of Systems  Constitutional:  Negative for chills, fatigue and fever.       Hot flashes  HENT:  Positive for congestion, rhinorrhea and sinus pressure. Negative for ear pain, postnasal drip, sinus pain and sore throat.   Respiratory:  Positive for shortness of breath. Negative for cough.   Cardiovascular:  Negative for chest pain.  Gastrointestinal:  Negative for abdominal pain, constipation, diarrhea, nausea and vomiting.  Genitourinary:  Negative for dysuria and hematuria.  Musculoskeletal:  Positive for back pain (takes ibuprofen 200 mg four daily or every other day.).  Neurological:  Positive for headaches. Negative for dizziness.       Objective:        08/06/2023   11:03 AM 02/12/2023    2:41 PM 02/12/2023    2:20 PM  Vitals with BMI  Height 4\' 10"   4\' 10"   Weight 100 lbs  100 lbs 10 oz  BMI 20.91  21.03  Systolic 134 148 161  Diastolic 78 82 100  Pulse 62  60     No data found.   Physical Exam Vitals reviewed.  Constitutional:      Comments: Tired, disheveled.   HENT:     Right Ear: Tympanic membrane, ear canal and external ear normal.     Left Ear: Tympanic membrane, ear canal and external ear normal.     Nose: Congestion present.     Comments: Scabs and some blood in left nostril On oxygen 2 L pnc    Mouth/Throat:     Pharynx: Oropharynx is clear.  Neck:     Vascular: No carotid bruit.  Cardiovascular:     Rate and Rhythm: Normal rate and regular rhythm.     Heart sounds: Normal heart sounds. No murmur heard. Pulmonary:     Effort: Pulmonary effort is normal. No respiratory distress.     Breath sounds: Normal breath sounds.  Abdominal:     Palpations: Abdomen is soft.     Tenderness: There is no abdominal tenderness.  Lymphadenopathy:     Cervical: No cervical adenopathy.  Neurological:     Mental Status: She is alert and oriented to person, place, and time.  Psychiatric:        Mood and Affect: Mood normal.        Behavior: Behavior normal.     Health Maintenance Due  Topic Date Due   Hepatitis C Screening  Never done   Zoster Vaccines- Shingrix (1 of 2) Never done    There are no preventive care reminders to display for this patient.   Lab Results  Component Value Date   TSH 1.250 08/06/2023   Lab Results  Component Value Date   WBC 7.4 08/06/2023   HGB 15.5 08/06/2023   HCT 48.8 (H) 08/06/2023   MCV 93 08/06/2023   PLT 208 08/06/2023   Lab Results  Component Value Date   NA 144 08/06/2023   K 3.7 08/06/2023   CO2 28 08/06/2023   GLUCOSE 85 08/06/2023   BUN 20 08/06/2023   CREATININE 1.12 (H) 08/06/2023   BILITOT 1.1 08/06/2023   ALKPHOS 60 08/06/2023   AST 19 08/06/2023   ALT 7 08/06/2023   PROT 7.1 08/06/2023   ALBUMIN 4.2 08/06/2023   CALCIUM 9.7 08/06/2023   EGFR 52 (L) 08/06/2023   Lab Results  Component Value Date   CHOL 151 08/06/2023   Lab Results  Component Value Date   HDL 60  08/06/2023   Lab Results  Component Value Date   LDLCALC 77 08/06/2023   Lab Results  Component Value Date   TRIG 71 08/06/2023   Lab Results  Component Value Date   CHOLHDL 2.5 08/06/2023   No results found for: "HGBA1C"     Assessment & Plan:  Depression, major, recurrent, mild (HCC) Assessment & Plan: Continue fluoxetine 20 mg daily for depression and anxiety. Has some Xanax to take for severe anxiety.   Age-related osteoporosis without current pathological fracture Assessment & Plan: No medicines   Aortic atherosclerosis (HCC) Assessment & Plan: Currently taking pravastatin to 20 mg nightly.  Orders: -     Lipid panel  Chronic obstructive bronchitis (HCC) -     AMB Referral VBCI Care Management  Chronic respiratory failure with hypoxia [facility]) Assessment & Plan: We discussed the importance of better lung function and provided you with samples of the Trelegy inhaler. We also referred you to a pharmacist, nurse, and social worker to help with medication coverage and support resources.  Orders: -     AMB Referral VBCI Care Management  Aneurysm of aortic arch without rupture [facility]) Assessment & Plan: Follow-up annually with vascular surgeon.  CTA of chest annually. Poor surgical candidate.   Ex-smoker Assessment & Plan: Encouraged to not smoke   Mixed hyperlipidemia Assessment & Plan: On pravastatin to 20 mg nightly the patient is tolerating the lower dose and is willing.  Orders: -     Lipid panel -     T4, free -     TSH  Essential hypertension Assessment & Plan: Blood pressure uncontrolled. Management per specialist. Currently on irbesartan 75 mg daily, propranolol er 120 mg once daily.  (She has been taking an addition irbesartan 75 mg at bedtime since her bp is running high).  Patient was given isosorbide 30 mg and recommended a half a pill daily.  She has not started this.   Check labs.   Orders: -     CBC with Differential/Platelet -      Comprehensive metabolic panel  Acute low back pain, unspecified back pain laterality, unspecified whether sciatica present Assessment & Plan: Chronic back pain is being managed with ibuprofen. May take tylenol as per instructions on bottle.     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 We discussed options for personal care aid and palliative care services. You have been referred to social work for caregiver stress counseling and community support resources.        Assessment and Plan       Meds ordered this encounter  Medications   DISCONTD: FLUoxetine (PROZAC) 20 MG capsule    Sig: Take 1 capsule (20 mg total) by mouth daily.    Dispense:  7 capsule    Refill:  0    Patient needs 1 week supply, waiting on mail order rx to be delivered.   DISCONTD: FLUoxetine (PROZAC) 20 MG capsule    Sig: Take 1 capsule (20 mg total) by mouth daily.    Dispense:  90 capsule    Refill:  1    1 week supply sent to her local pharmacy until mail order rx is delivered.    Orders Placed This Encounter  Procedures   CBC with Differential/Platelet   Comprehensive metabolic panel   Lipid panel   T4, free   TSH   AMB Referral VBCI Care Management     Follow-up: No follow-ups on file.  An After Visit Summary was printed and given to the patient.   Clayborn Bigness I Leal-Borjas,acting as a scribe for Blane Ohara, MD.,have documented all relevant documentation on the behalf of Blane Ohara, MD,as directed by  Blane Ohara, MD while in the presence of Blane Ohara, MD.   Blane Ohara, MD [facility] [phone]

## 2023-08-06 ENCOUNTER — Encounter: Payer: Self-pay | Admitting: Family Medicine

## 2023-08-06 ENCOUNTER — Ambulatory Visit (INDEPENDENT_AMBULATORY_CARE_PROVIDER_SITE_OTHER): Admitting: Family Medicine

## 2023-08-06 ENCOUNTER — Other Ambulatory Visit: Payer: Self-pay | Admitting: Family Medicine

## 2023-08-06 VITALS — BP 134/78 | HR 62 | Temp 97.8°F | Ht <= 58 in | Wt 100.0 lb

## 2023-08-06 DIAGNOSIS — E782 Mixed hyperlipidemia: Secondary | ICD-10-CM

## 2023-08-06 DIAGNOSIS — F330 Major depressive disorder, recurrent, mild: Secondary | ICD-10-CM | POA: Diagnosis not present

## 2023-08-06 DIAGNOSIS — M810 Age-related osteoporosis without current pathological fracture: Secondary | ICD-10-CM | POA: Diagnosis not present

## 2023-08-06 DIAGNOSIS — I7122 Aneurysm of the aortic arch, without rupture: Secondary | ICD-10-CM | POA: Diagnosis not present

## 2023-08-06 DIAGNOSIS — J320 Chronic maxillary sinusitis: Secondary | ICD-10-CM | POA: Diagnosis not present

## 2023-08-06 DIAGNOSIS — J4489 Other specified chronic obstructive pulmonary disease: Secondary | ICD-10-CM | POA: Diagnosis not present

## 2023-08-06 DIAGNOSIS — I10 Essential (primary) hypertension: Secondary | ICD-10-CM | POA: Diagnosis not present

## 2023-08-06 DIAGNOSIS — I700 Atherosclerosis of aorta: Secondary | ICD-10-CM

## 2023-08-06 DIAGNOSIS — Z87891 Personal history of nicotine dependence: Secondary | ICD-10-CM | POA: Diagnosis not present

## 2023-08-06 DIAGNOSIS — M545 Low back pain, unspecified: Secondary | ICD-10-CM

## 2023-08-06 DIAGNOSIS — Z636 Dependent relative needing care at home: Secondary | ICD-10-CM | POA: Diagnosis not present

## 2023-08-06 DIAGNOSIS — G8929 Other chronic pain: Secondary | ICD-10-CM

## 2023-08-06 DIAGNOSIS — J9611 Chronic respiratory failure with hypoxia: Secondary | ICD-10-CM

## 2023-08-06 MED ORDER — FLUOXETINE HCL 20 MG PO CAPS: 20.0000 mg | ORAL_CAPSULE | Freq: Every day | ORAL | 1 refills | Status: DC

## 2023-08-06 MED ORDER — FLUOXETINE HCL 20 MG PO CAPS: 20.0000 mg | ORAL_CAPSULE | Freq: Every day | ORAL | 0 refills | Status: DC

## 2023-08-06 MED ORDER — FLUOXETINE HCL 20 MG PO CAPS
20.0000 mg | ORAL_CAPSULE | Freq: Every day | ORAL | 0 refills | Status: DC
Start: 2023-08-06 — End: 2023-08-06

## 2023-08-06 NOTE — Patient Instructions (Addendum)
 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 We reviewed your current treatments and made some adjustments to help manage your symptoms more effectively.  YOUR PLAN:  -CHRONIC OBSTRUCTIVE PULMONARY DISEASE (COPD): COPD is a chronic lung condition that makes it hard to breathe. We discussed the importance of better lung function and provided you with samples of the Trelegy inhaler. We also referred you to a pharmacist, nurse, and social worker to help with medication coverage and support resources.  -BACK PAIN: Your chronic back pain is being managed with ibuprofen. May take tylenol as per instructions on bottle.   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 We discussed options for personal care aid and palliative care services. You have been referred to social work for caregiver stress counseling and community support resources.  -GENERAL HEALTH MAINTENANCE: We discussed the importance of staying up-to-date with vaccinations. You have not received several recommended vaccinations, including RSV, shingles, and tetanus. These can be administered at the pharmacy.  INSTRUCTIONS:  Please schedule a follow-up appointment in three months. Additionally, you will need to have blood work done to update your health status.  For more information, you can read your full clinical note, available in your patient portal.

## 2023-08-07 ENCOUNTER — Telehealth: Payer: Self-pay | Admitting: *Deleted

## 2023-08-07 LAB — CBC WITH DIFFERENTIAL/PLATELET
Basophils Absolute: 0.1 10*3/uL (ref 0.0–0.2)
Basos: 1 %
EOS (ABSOLUTE): 0.2 10*3/uL (ref 0.0–0.4)
Eos: 2 %
Hematocrit: 48.8 % — ABNORMAL HIGH (ref 34.0–46.6)
Hemoglobin: 15.5 g/dL (ref 11.1–15.9)
Immature Grans (Abs): 0.1 10*3/uL (ref 0.0–0.1)
Immature Granulocytes: 1 %
Lymphocytes Absolute: 1.3 10*3/uL (ref 0.7–3.1)
Lymphs: 17 %
MCH: 29.6 pg (ref 26.6–33.0)
MCHC: 31.8 g/dL (ref 31.5–35.7)
MCV: 93 fL (ref 79–97)
Monocytes Absolute: 0.5 10*3/uL (ref 0.1–0.9)
Monocytes: 7 %
Neutrophils Absolute: 5.3 10*3/uL (ref 1.4–7.0)
Neutrophils: 72 %
Platelets: 208 10*3/uL (ref 150–450)
RBC: 5.24 x10E6/uL (ref 3.77–5.28)
RDW: 13 % (ref 11.7–15.4)
WBC: 7.4 10*3/uL (ref 3.4–10.8)

## 2023-08-07 LAB — COMPREHENSIVE METABOLIC PANEL
ALT: 7 IU/L (ref 0–32)
AST: 19 IU/L (ref 0–40)
Albumin: 4.2 g/dL (ref 3.8–4.8)
Alkaline Phosphatase: 60 IU/L (ref 44–121)
BUN/Creatinine Ratio: 18 (ref 12–28)
BUN: 20 mg/dL (ref 8–27)
Bilirubin Total: 1.1 mg/dL (ref 0.0–1.2)
CO2: 28 mmol/L (ref 20–29)
Calcium: 9.7 mg/dL (ref 8.7–10.3)
Chloride: 101 mmol/L (ref 96–106)
Creatinine, Ser: 1.12 mg/dL — ABNORMAL HIGH (ref 0.57–1.00)
Globulin, Total: 2.9 g/dL (ref 1.5–4.5)
Glucose: 85 mg/dL (ref 70–99)
Potassium: 3.7 mmol/L (ref 3.5–5.2)
Sodium: 144 mmol/L (ref 134–144)
Total Protein: 7.1 g/dL (ref 6.0–8.5)
eGFR: 52 mL/min/{1.73_m2} — ABNORMAL LOW (ref >59)

## 2023-08-07 LAB — LIPID PANEL
Chol/HDL Ratio: 2.5 ratio (ref 0.0–4.4)
Cholesterol, Total: 151 mg/dL (ref 100–199)
HDL: 60 mg/dL (ref >39)
LDL Chol Calc (NIH): 77 mg/dL (ref 0–99)
Triglycerides: 71 mg/dL (ref 0–149)
VLDL Cholesterol Cal: 14 mg/dL (ref 5–40)

## 2023-08-07 LAB — TSH: TSH: 1.25 u[IU]/mL (ref 0.450–4.500)

## 2023-08-07 LAB — T4, FREE: Free T4: 1.25 ng/dL (ref 0.82–1.77)

## 2023-08-07 NOTE — Progress Notes (Signed)
 Complex Care Management Note  Care Guide Note 08/07/2023 Name: Gitel Beste MRN: [id number] DOB: [birth year]  Arta Silence Corker is a 73 y.o. year old female who sees Cox, Kirsten, MD for primary care. I reached out to Georgette Shell by phone today to offer complex care management services.  Ms. Shropshire was given information about Complex Care Management services today including:   The Complex Care Management services include support from the care team which includes your Nurse Care Manager, Clinical Social Worker, or Pharmacist.  The Complex Care Management team is here to help remove barriers to the health concerns and goals most important to you. Complex Care Management services are voluntary, and the patient may decline or stop services at any time by request to their care team member.   Complex Care Management Consent Status: Patient agreed to services and verbal consent obtained.   Follow up plan:  Telephone appointment with complex care management team member scheduled for:  08/13/23  Encounter Outcome:  Patient Scheduled  Gwenevere Ghazi  [facility]  [facility], [facility] Guide  Direct Dial: [phone]  Fax [phone]

## 2023-08-08 DIAGNOSIS — J4489 Other specified chronic obstructive pulmonary disease: Secondary | ICD-10-CM | POA: Insufficient documentation

## 2023-08-08 DIAGNOSIS — Z636 Dependent relative needing care at home: Secondary | ICD-10-CM | POA: Insufficient documentation

## 2023-08-08 DIAGNOSIS — J9611 Chronic respiratory failure with hypoxia: Secondary | ICD-10-CM | POA: Insufficient documentation

## 2023-08-08 DIAGNOSIS — M545 Low back pain, unspecified: Secondary | ICD-10-CM | POA: Insufficient documentation

## 2023-08-08 NOTE — Assessment & Plan Note (Signed)
 Blood pressure uncontrolled. Management per specialist. Currently on irbesartan 75 mg daily, propranolol er 120 mg once daily.  (She has been taking an addition irbesartan 75 mg at bedtime since her bp is running high).  Patient was given isosorbide 30 mg and recommended a half a pill daily.  She has not started this.   Check labs.

## 2023-08-08 NOTE — Assessment & Plan Note (Signed)
 Continue fluoxetine 20 mg daily for depression and anxiety. Has some Xanax to take for severe anxiety.

## 2023-08-08 NOTE — Assessment & Plan Note (Signed)
 We discussed the importance of better lung function and provided you with samples of the Trelegy inhaler. We also referred you to a pharmacist, nurse, and social worker to help with medication coverage and support resources.

## 2023-08-08 NOTE — Assessment & Plan Note (Signed)
No medicines

## 2023-08-08 NOTE — Assessment & Plan Note (Signed)
 On pravastatin to 20 mg nightly the patient is tolerating the lower dose and is willing.

## 2023-08-08 NOTE — Assessment & Plan Note (Signed)
 Chronic back pain is being managed with ibuprofen. May take tylenol as per instructions on bottle.

## 2023-08-08 NOTE — Assessment & Plan Note (Signed)
 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 We discussed options for personal care aid and palliative care services. You have been referred to social work for caregiver stress counseling and community support resources.

## 2023-08-08 NOTE — Assessment & Plan Note (Signed)
Encouraged to not smoke.

## 2023-08-08 NOTE — Assessment & Plan Note (Signed)
Follow-up annually with vascular surgeon.  CTA of chest annually. Poor surgical candidate.

## 2023-08-08 NOTE — Assessment & Plan Note (Signed)
 Currently taking pravastatin to 20 mg nightly.

## 2023-08-09 DIAGNOSIS — J320 Chronic maxillary sinusitis: Secondary | ICD-10-CM | POA: Insufficient documentation

## 2023-08-09 NOTE — Assessment & Plan Note (Signed)
 She uses albuterol nebulizer frequently due to insurance issues with inhalers. Discussed risks of steroid inhalers and emphasized need for better lung function. - Provide samples of Trelegy inhaler. - Refer to pharmacist, nurse, and social worker for assistance with medication coverage and support resources.

## 2023-08-09 NOTE — Assessment & Plan Note (Signed)
 Persistent rhinorrhea and headaches likely related to sinus issues. Over-the-counter antihistamines ineffective. Rhinorrhea may be exacerbated by oxygen use. - Recommend nasal saline spray for symptomatic relief of nasal irritation.

## 2023-08-12 ENCOUNTER — Ambulatory Visit: Payer: Self-pay | Admitting: *Deleted

## 2023-08-12 NOTE — Patient Outreach (Signed)
 Care Coordination   08/12/2023 Name: Misty Evans MRN: [id number] DOB: 04/09/1951   Care Coordination Outreach Attempts:  An unsuccessful outreach was attempted for an appointment today.  Follow Up Plan:  Additional outreach attempts will be made to offer the patient complex care management information and services.   Encounter Outcome:  No Answer. Left HIPAA compliant VM.   Care Coordination Interventions:  No, not indicated. Staff message sent to scheduling care guide requesting outreach and rescheduling.    Demetrios Loll, RN, BSN [place]  [facility], [facility] Health RN Care Manager Direct Dial: [phone]

## 2023-08-13 ENCOUNTER — Telehealth: Payer: Self-pay

## 2023-08-13 NOTE — Progress Notes (Signed)
 Care Guide Pharmacy Note  08/13/2023 Name: Misty Evans MRN: [id number] DOB: 1951/03/24  Referred By: Blane Ohara, MD Reason for referral: Care Coordination (Outreach to schedule with Pharm d )   Misty Evans is a 73 y.o. year old female who is a primary care patient of Cox, Kirsten, MD.  Georgette Shell was referred to the pharmacist for assistance related to: COPD  An unsuccessful telephone outreach was attempted today to contact the patient who was referred to the pharmacy team for assistance with medication assistance. Additional attempts will be made to contact the patient.  Penne Lash , RMA     [facility]  [facility], [facility] Guide  Direct Dial: [phone]  Website: [email or web address]

## 2023-08-21 ENCOUNTER — Other Ambulatory Visit: Payer: Self-pay

## 2023-08-21 DIAGNOSIS — F324 Major depressive disorder, single episode, in partial remission: Secondary | ICD-10-CM

## 2023-08-21 NOTE — Patient Outreach (Signed)
 Care Coordination   Initial Visit Note   08/21/2023 Name: Ricketta Colantonio MRN: [id number] DOB: 1950-06-24  Arta Silence Kukla is a 73 y.o. year old female who sees Cox, Kirsten, MD for primary care. I spoke with  Georgette Shell by phone today.  What matters to the patients health and wellness today?  Managing and improving symptoms of COPD, HTN and CHF.  RNCM instructed patient to take BP prior to and one hour after taking her BP meds.  Patient reports she has increased her O2 from 2L to 3L for the last week, due to increased fatigue and shortness of breath.  RNCM education provided on when to contact her pulmonogist.  RNCM instructed patient to keep a log book with daily weight and BP readings, and will discuss at next visit.     Goals Addressed             This Visit's Progress    CCM Expected Outcome:  Monitor, Self-Manage and Reduce Symptoms of Heart Failure       Interventions Today    Flowsheet Row Most Recent Value  Chronic Disease   Chronic disease during today's visit Chronic Obstructive Pulmonary Disease (COPD), Congestive Heart Failure (CHF), Hypertension (HTN)  [discussed with patient  need to monitor blood pressure before and after medication.  discussed the need for daily weights.  discussed the importance of notifying providers of changes in O2 needs,increase in weight of more than 2 lbs daily, or 5 lbs week]  General Interventions   General Interventions Discussed/Reviewed General Interventions Discussed, Doctor Visits  [patient has appointment with Pulm Dr. Derrell Lolling in April, date unknown.  PCP appointment 11/10/23]  Doctor Visits Discussed/Reviewed Doctor Visits Discussed, PCP, Specialist  PCP/Specialist Visits Compliance with follow-up visit  Education Interventions   Education Provided Provided Education  [facility] education provided regarding need for daily BP and weight monitoring.  Patient agreed to keep log and report to [facility] during next scheduled  visit]  Provided Verbal Education On Mental Health/Coping with Illness, Medication, When to see the doctor  [provided patient with contact information for Care Guide to schedule appointment with Pharamcy to discuss financial concerns for Trelegy prescription.]  Mental Health Interventions   Mental Health Discussed/Reviewed Mental Health Discussed, Refer to Social Work for counseling, Refer to Social Work for resources  Refer to Social Work for counseling regarding Other, Depression  Teacher, English as a foreign language burnout]  Refer to Social Work for resources regarding Other  [needs resources for respite care if available]  Pharmacy Interventions   Pharmacy Dicussed/Reviewed Pharmacy Topics Discussed, Medications and their functions, Medication Adherence, Affording Medications  Medication Adherence Unable to refill medication  [advised patient to schedule appointment with Pharmacy to discuss Trelegy costs]           CCM Expected Outcome:  Monitor, Self-Manage, and Reduce Symptoms of Hypertension       Interventions Today    Flowsheet Row Most Recent Value  Chronic Disease   Chronic disease during today's visit Chronic Obstructive Pulmonary Disease (COPD), Congestive Heart Failure (CHF), Hypertension (HTN)  [discussed with patient  need to monitor blood pressure before and after medication.  discussed the need for daily weights.  discussed the importance of notifying providers of changes in O2 needs,increase in weight of more than 2 lbs daily, or 5 lbs week]  General Interventions   General Interventions Discussed/Reviewed General Interventions Discussed, Doctor Visits  [patient has appointment with Pulm Dr. Derrell Lolling in April, date unknown.  PCP appointment 11/10/23]  Doctor Visits  Discussed/Reviewed Doctor Visits Discussed, PCP, Specialist  PCP/Specialist Visits Compliance with follow-up visit  Education Interventions   Education Provided Provided Education  [facility] education provided regarding need for daily BP and  weight monitoring.  Patient agreed to keep log and report to [facility] during next scheduled visit]  Provided Verbal Education On Mental Health/Coping with Illness, Medication, When to see the doctor  [provided patient with contact information for Care Guide to schedule appointment with Pharamcy to discuss financial concerns for Trelegy prescription.]  Mental Health Interventions   Mental Health Discussed/Reviewed Mental Health Discussed, Refer to Social Work for counseling, Refer to Social Work for resources  Refer to Social Work for counseling regarding Other, Depression  Teacher, English as a foreign language burnout]  Refer to Social Work for resources regarding Other  [needs resources for respite care if available]  Pharmacy Interventions   Pharmacy Dicussed/Reviewed Pharmacy Topics Discussed, Medications and their functions, Medication Adherence, Affording Medications  Medication Adherence Unable to refill medication  [advised patient to schedule appointment with Pharmacy to discuss Trelegy costs]           CCM:  Maintain, Monitor and Self-Manage Symptoms of COPD       Interventions Today    Flowsheet Row Most Recent Value  Chronic Disease   Chronic disease during today's visit Chronic Obstructive Pulmonary Disease (COPD), Congestive Heart Failure (CHF), Hypertension (HTN)  [discussed with patient  need to monitor blood pressure before and after medication.  discussed the need for daily weights.  discussed the importance of notifying providers of changes in O2 needs,increase in weight of more than 2 lbs daily, or 5 lbs week]  General Interventions   General Interventions Discussed/Reviewed General Interventions Discussed, Doctor Visits  [patient has appointment with Pulm Dr. Derrell Lolling in April, date unknown.  PCP appointment 11/10/23]  Doctor Visits Discussed/Reviewed Doctor Visits Discussed, PCP, Specialist  PCP/Specialist Visits Compliance with follow-up visit  Education Interventions   Education Provided Provided  Education  [facility] education provided regarding need for daily BP and weight monitoring.  Patient agreed to keep log and report to [facility] during next scheduled visit]  Provided Verbal Education On Mental Health/Coping with Illness, Medication, When to see the doctor  [provided patient with contact information for Care Guide to schedule appointment with Pharamcy to discuss financial concerns for Trelegy prescription.]  Mental Health Interventions   Mental Health Discussed/Reviewed Mental Health Discussed, Refer to Social Work for counseling, Refer to Social Work for resources  Refer to Social Work for counseling regarding Other, Depression  Teacher, English as a foreign language burnout]  Refer to Social Work for resources regarding Other  [needs resources for respite care if available]  Pharmacy Interventions   Pharmacy Dicussed/Reviewed Pharmacy Topics Discussed, Medications and their functions, Medication Adherence, Affording Medications  Medication Adherence Unable to refill medication  [advised patient to schedule appointment with Pharmacy to discuss Trelegy costs]              SDOH assessments and interventions completed:  Yes  SDOH Interventions Today    Flowsheet Row Most Recent Value  SDOH Interventions   Food Insecurity Interventions Intervention Not Indicated  Housing Interventions Intervention Not Indicated  Transportation Interventions Intervention Not Indicated  Utilities Interventions Intervention Not Indicated  Depression Interventions/Treatment  --  [patient declined VBCI LCSW referral]        Care Coordination Interventions:  Yes, provided   Follow up plan: Follow up call scheduled for 08/28/23 at 1:00 pm    Encounter Outcome:  Patient Visit Completed    Ruel Favors BSN RN  CCM [place]  [facility], [facility] Health RN Care Manager Direct Dial: [phone] Fax: [phone]

## 2023-08-21 NOTE — Patient Instructions (Signed)
 Visit Information  Thank you for taking time to visit with me today. Please don't hesitate to contact me if I can be of assistance to you before our next scheduled telephone appointment.  Following are the goals we discussed today:    Goals Addressed                       This Visit's Progress     CCM Expected Outcome:  Monitor, Self-Manage and Reduce Symptoms of Heart Failure            Interventions Today     Flowsheet Row Most Recent Value  Chronic Disease    Chronic disease during today's visit Chronic Obstructive Pulmonary Disease (COPD), Congestive Heart Failure (CHF), Hypertension (HTN)  [discussed with patient  need to monitor blood pressure before and after medication.  discussed the need for daily weights.  discussed the importance of notifying providers of changes in O2 needs,increase in weight of more than 2 lbs daily, or 5 lbs week]  General Interventions    General Interventions Discussed/Reviewed General Interventions Discussed, Doctor Visits  [patient has appointment with Pulm Dr. Derrell Lolling in April, date unknown.  PCP appointment 11/10/23]  Doctor Visits Discussed/Reviewed Doctor Visits Discussed, PCP, Specialist  PCP/Specialist Visits Compliance with follow-up visit  Education Interventions    Education Provided Provided Education  [facility] education provided regarding need for daily BP and weight monitoring.  Patient agreed to keep log and report to [facility] during next scheduled visit]  Provided Verbal Education On Mental Health/Coping with Illness, Medication, When to see the doctor  [provided patient with contact information for Care Guide to schedule appointment with Pharamcy to discuss financial concerns for Trelegy prescription.]  Mental Health Interventions    Mental Health Discussed/Reviewed Mental Health Discussed, Refer to Social Work for counseling, Refer to Social Work for resources  Refer to Social Work for counseling regarding Other, Depression  Teacher, English as a foreign language  burnout]  Refer to Social Work for resources regarding Other  [needs resources for respite care if available]  Pharmacy Interventions    Pharmacy Dicussed/Reviewed Pharmacy Topics Discussed, Medications and their functions, Medication Adherence, Affording Medications  Medication Adherence Unable to refill medication  [advised patient to schedule appointment with Pharmacy to discuss Trelegy costs]               CCM Expected Outcome:  Monitor, Self-Manage, and Reduce Symptoms of Hypertension            Interventions Today     Flowsheet Row Most Recent Value  Chronic Disease    Chronic disease during today's visit Chronic Obstructive Pulmonary Disease (COPD), Congestive Heart Failure (CHF), Hypertension (HTN)  [discussed with patient  need to monitor blood pressure before and after medication.  discussed the need for daily weights.  discussed the importance of notifying providers of changes in O2 needs,increase in weight of more than 2 lbs daily, or 5 lbs week]  General Interventions    General Interventions Discussed/Reviewed General Interventions Discussed, Doctor Visits  [patient has appointment with Pulm Dr. Derrell Lolling in April, date unknown.  PCP appointment 11/10/23]  Doctor Visits Discussed/Reviewed Doctor Visits Discussed, PCP, Specialist  PCP/Specialist Visits Compliance with follow-up visit  Education Interventions    Education Provided Provided Education  [facility] education provided regarding need for daily BP and weight monitoring.  Patient agreed to keep log and report to [facility] during next scheduled visit]  Provided Verbal Education On Mental Health/Coping with Illness, Medication, When to see the doctor  [  provided patient with contact information for Care Guide to schedule appointment with Pharamcy to discuss financial concerns for Trelegy prescription.]  Mental Health Interventions    Mental Health Discussed/Reviewed Mental Health Discussed, Refer to Social Work for counseling, Refer to  Social Work for resources  Refer to Social Work for counseling regarding Other, Depression  Teacher, English as a foreign language burnout]  Refer to Social Work for resources regarding Other  [needs resources for respite care if available]  Pharmacy Interventions    Pharmacy Dicussed/Reviewed Pharmacy Topics Discussed, Medications and their functions, Medication Adherence, Affording Medications  Medication Adherence Unable to refill medication  [advised patient to schedule appointment with Pharmacy to discuss Trelegy costs]               CCM:  Maintain, Monitor and Self-Manage Symptoms of COPD            Interventions Today     Flowsheet Row Most Recent Value  Chronic Disease    Chronic disease during today's visit Chronic Obstructive Pulmonary Disease (COPD), Congestive Heart Failure (CHF), Hypertension (HTN)  [discussed with patient  need to monitor blood pressure before and after medication.  discussed the need for daily weights.  discussed the importance of notifying providers of changes in O2 needs,increase in weight of more than 2 lbs daily, or 5 lbs week]  General Interventions    General Interventions Discussed/Reviewed General Interventions Discussed, Doctor Visits  [patient has appointment with Pulm Dr. Derrell Lolling in April, date unknown.  PCP appointment 11/10/23]  Doctor Visits Discussed/Reviewed Doctor Visits Discussed, PCP, Specialist  PCP/Specialist Visits Compliance with follow-up visit  Education Interventions    Education Provided Provided Education  [facility] education provided regarding need for daily BP and weight monitoring.  Patient agreed to keep log and report to [facility] during next scheduled visit]  Provided Verbal Education On Mental Health/Coping with Illness, Medication, When to see the doctor  [provided patient with contact information for Care Guide to schedule appointment with Pharamcy to discuss financial concerns for Trelegy prescription.]  Mental Health Interventions    Mental Health  Discussed/Reviewed Mental Health Discussed, Refer to Social Work for counseling, Refer to Social Work for resources  Refer to Social Work for counseling regarding Other, Depression  Teacher, English as a foreign language burnout]  Refer to Social Work for resources regarding Other  [needs resources for respite care if available]  Pharmacy Interventions    Pharmacy Dicussed/Reviewed Pharmacy Topics Discussed, Medications and their functions, Medication Adherence, Affording Medications  Medication Adherence Unable to refill medication  [advised patient to schedule appointment with Pharmacy to discuss Trelegy costs]                     SDOH assessments and interventions completed:  Yes   SDOH Interventions Today     Flowsheet Row Most Recent Value  SDOH Interventions    Food Insecurity Interventions Intervention Not Indicated  Housing Interventions Intervention Not Indicated  Transportation Interventions Intervention Not Indicated  Utilities Interventions Intervention Not Indicated  Depression Interventions/Treatment  --  [patient declined VBCI LCSW referral]          Our next appointment is by telephone on 08/28/23 at 1:00pm  Please call the care guide team at [phone] if you need to cancel or reschedule your appointment.   If you are experiencing a Mental Health or Behavioral Health Crisis or need someone to talk to, please call the Suicide and Crisis Lifeline: 988 call the [place] National Suicide Prevention Lifeline: 248 756 7775 or TTY: 623 052 5194 TTY 431-261-2534) to talk to  a trained counselor call 1-800-273-TALK (toll free, 24 hour hotline)      Ruel Favors BSN RN CCM [place]  [facility], [facility] Health RN Care Manager Direct Dial: [phone] Fax: [phone]

## 2023-08-28 ENCOUNTER — Telehealth: Payer: Self-pay

## 2023-08-28 ENCOUNTER — Other Ambulatory Visit: Payer: Self-pay

## 2023-08-28 DIAGNOSIS — I5022 Chronic systolic (congestive) heart failure: Secondary | ICD-10-CM

## 2023-08-28 NOTE — Patient Outreach (Addendum)
  Care Management   Visit Note  08/28/2023 Name: Jemima Petko MRN: [id number] DOB: 1951-02-08  Subjective: Shruthi Northrup is a 73 y.o. year old female who is a primary care patient of Cox, Kirsten, MD. The Care Management team was consulted for assistance.      Engaged with patient spoke with patient by telephone.   Assessment: Patient reports daily BP monitoring, both before and after medications.  Last three readings after medication:  145/105, 150/82, 131/86.  Patient not weighing daily, was encouraged to take weight at the same time as BP and document accordingly.  Patient reports still requiring 3L of O2up from prescribed 2L  Vitals:   08/28/23 1327 08/28/23 1328  BP: (!) 150/82 131/86      Interventions: Evaluation of current treatment plan related to COPD, CHF and HTN and patient's adherence to plan as established by provider. Advised patient, providing education and rationale, to monitor blood pressure daily and record, calling PCP for findings outside established parameters.  Advised patient, providing education and rationale, to weigh daily and record, calling PCP for weight gain of 3lbs overnight or 5 pounds in a week.  Social Work referral for [employer] for caregiving respite resources RNCM sent message to PCP to inform of most recent BP readings.   Ruel Favors BSN RN CCM [place]  [facility], [facility] Health RN Care Manager Direct Dial: [phone] Fax: [phone]

## 2023-08-28 NOTE — Progress Notes (Signed)
   08/28/2023  Patient ID: Misty Evans, female   DOB: 1951-05-09, 73 y.o.   MRN: [id number]  Contacted patient regarding referral for medication access and chronic lung disease from Blane Ohara, MD   Appointment scheduled   Future Appointments  Date Time Provider Department Center  08/28/2023  1:00 PM Ruel Favors, RN [facility] None  09/01/2023  9:00 AM Dahlia Byes, RPH [facility] None  11/10/2023 10:00 AM Cox, Fritzi Mandes, MD [facility] None    Dahlia Byes, PharmD, BCGP Clinical Pharmacist  [phone]

## 2023-08-28 NOTE — Patient Instructions (Signed)
 Visit Information  Thank you for taking time to visit with me today. Please don't hesitate to contact me if I can be of assistance to you before our next scheduled telephone appointment.  Our next appointment is by telephone on 09/09/23 at 2:00pm  Please call the care guide team at [phone] if you need to cancel or reschedule your appointment.   If you are experiencing a Mental Health or Behavioral Health Crisis or need someone to talk to, please call the Suicide and Crisis Lifeline: 988 call the [place] National Suicide Prevention Lifeline: 651-504-0973 or TTY: 236-104-4335 TTY 225 004 6019) to talk to a trained counselor call 1-800-273-TALK (toll free, 24 hour hotline)     Ruel Favors BSN RN CCM [place]  [facility], [facility] Health RN Care Manager Direct Dial: [phone] Fax: [phone]

## 2023-09-01 ENCOUNTER — Other Ambulatory Visit: Payer: Self-pay

## 2023-09-01 ENCOUNTER — Telehealth: Payer: Self-pay

## 2023-09-01 NOTE — Progress Notes (Signed)
   09/01/2023  Patient ID: Misty Evans, female   DOB: [birth year], 73 y.o.   MRN: [id number]  Attempted to contact patient for scheduled appointment for medication management. Left HIPAA compliant message for patient to return my call at their convenience. Should pt call back, I offered 10am appt time. Hoping to help with Trelegy.   Dahlia Byes, PharmD, BCGP Clinical Pharmacist  [phone]

## 2023-09-03 ENCOUNTER — Telehealth: Payer: Self-pay

## 2023-09-09 ENCOUNTER — Other Ambulatory Visit: Payer: Self-pay

## 2023-09-09 NOTE — Patient Instructions (Signed)
 Visit Information  Thank you for taking time to visit with me today. Please don't hesitate to contact me if I can be of assistance to you before our next scheduled telephone appointment.  Our next appointment is by telephone on 09/23/2023 at 2:00  Following is a copy of your care plan:   Goals Addressed             This Visit's Progress    VBCI RN Care Plan       Problems:  Chronic Disease Management support and education needs related to HTN  Goal: Over the next 30 days the Patient will attend all scheduled medical appointments:   as evidenced by chart review and patient report        demonstrate ongoing self health care management ability to take daily blood pressure readings and weight  as evidenced by     not experience hospital admission as evidenced by review of EMR. Hospital Admissions in last 6 months =   take all medications exactly as prescribed and will call provider for medication related questions as evidenced by charit review and patient report, pharmacy referral has been placed     Interventions:   Hypertension Interventions:  (Status:  Goal on track:  NO.) Long Term Goal Last practice recorded BP readings:  BP Readings from Last 3 Encounters:  09/07/23 139/69  08/28/23 131/86  08/06/23 134/78   Most recent eGFR/CrCl:  Lab Results  Component Value Date   EGFR 52 (L) 08/06/2023    No components found for: "CRCL"  Evaluation of current treatment plan related to hypertension self management and patient's adherence to plan as established by provider Provided education to patient re: stroke prevention, s/s of heart attack and stroke Reviewed medications with patient and discussed importance of compliance Discussed plans with patient for ongoing care management follow up and provided patient with direct contact information for care management team Advised patient, providing education and rationale, to monitor blood pressure daily and record, calling PCP for  findings outside established parameters Reviewed scheduled/upcoming provider appointments including:  Advised patient to discuss hearing concerns with provider  Patient Self-Care Activities:  Attend all scheduled provider appointments Call pharmacy for medication refills 3-7 days in advance of running out of medications Call provider office for new concerns or questions  Take medications as prescribed   Work with the pharmacist to address medication management needs and will continue to work with the clinical team to address health care and disease management related needs  Plan:  Next PCP appointment scheduled for: 11/10/23 Next [facility] appointment scheduled for 09/23/23             Patient verbalizes understanding of instructions and care plan provided today and agrees to view in MyChart. Active MyChart status and patient understanding of how to access instructions and care plan via MyChart confirmed with patient.     Telephone follow up appointment with care management team member scheduled for:  Please call the care guide team at [phone] if you need to cancel or reschedule your appointment.   Please call the Suicide and Crisis Lifeline: 988 call the [place] National Suicide Prevention Lifeline: 203-835-4079 or TTY: 7177252689 TTY (908) 528-0831) to talk to a trained counselor call 1-800-273-TALK (toll free, 24 hour hotline) if you are experiencing a Mental Health or Behavioral Health Crisis or need someone to talk to4   Ruel Favors BSN RN CCM [place]  [facility], [facility] Health RN Care Manager Direct Dial: [phone] Fax: [phone]

## 2023-09-09 NOTE — Progress Notes (Signed)
 Complex Care Management Note  Care Guide Note 09/09/2023 Name: Misty Evans MRN: [id number] DOB: November 01, 1950  Misty Evans is a 73 y.o. year old female who sees Cox, Kirsten, MD for primary care. I reached out to Georgette Shell by phone today to offer complex care management services.  [name] was given information about Complex Care Management services today including:   The Complex Care Management services include support from the care team which includes your Nurse Care Manager, Clinical Social Worker, or Pharmacist.  The Complex Care Management team is here to help remove barriers to the health concerns and goals most important to you. Complex Care Management services are voluntary, and the patient may decline or stop services at any time by request to their care team member.   Complex Care Management Consent Status: Patient did not agree to participate in complex care management services at this time.  Follow up plan:  Patient would like to follow up with RN case manager.   Encounter Outcome:  Patient Refused  [facility], [facility] Health Care Management Assistant Direct Dial: [phone]  Fax: [phone]

## 2023-09-09 NOTE — Patient Outreach (Signed)
 Complex Care Management   Visit Note  09/09/2023  Name:  Misty Evans MRN: [id number] DOB: [birth year]  Situation: Referral received for Complex Care Management related to  Hypertension  I obtained verbal consent from patient.  Visit completed with patient  on the phone  Background:   Past Medical History:  Diagnosis Date   Abnormal weight loss 01/04/2020   Acute respiratory failure with hypoxia (HCC) 10/05/2022   Allergic rhinitis due to allergen 07/03/2021   Aortic aneurysm of unspecified site, without rupture (HCC) 11/26/2022   Aortic atherosclerosis (HCC) 06/25/2021   COPD (chronic obstructive pulmonary disease) (HCC)    COPD exacerbation (HCC) 11/12/2019   Depression    Depression, major, recurrent, mild (HCC) 11/12/2019   Encounter for screening mammogram for malignant neoplasm of breast 10/05/2022   Essential hypertension 11/02/2019   Ex-smoker 11/26/2022   GERD (gastroesophageal reflux disease)    History of nephrolithiasis 02/04/2019   Insomnia    Mild protein-calorie malnutrition (HCC) 11/12/2019   Mixed dyslipidemia 11/02/2019   Mixed hyperlipidemia 11/02/2019   Need for immunization against influenza 04/06/2022   Nodule of left lung 01/04/2020   Osteoporosis    Renal stones 05/2015   Screening for lung cancer 10/05/2022   Shortness of breath 10/05/2022    Assessment: Patient Reported Symptoms:  Cognitive Alert and oriented to person, place, and time, Normal speech and language skills  Neurological No symptoms reported    HEENT No symptoms reported    Cardiovascular No symptoms reported    Respiratory Shortness of breath    Endocrine No symptoms reported    Gastrointestinal No symptoms reported, Change in appetite    Genitourinary No symptoms reported    Integumentary No symptoms reported    Musculoskeletal No symptoms reported    Psychosocial No symptoms reported     There were no vitals filed for this visit.  Medications Reviewed  Today     Reviewed by Ruel Favors, RN (Registered Nurse) on 09/09/23 at 1434  Med List Status: <None>   Medication Order Taking? Sig Documenting Provider Last Dose Status Informant  albuterol (ACCUNEB) 1.25 MG/3ML nebulizer solution [id number] Yes Take 1 ampule by nebulization every 6 (six) hours as needed for wheezing. [provider] Taking Active Self  aspirin 81 MG chewable tablet [id number] Yes Chew 81 mg by mouth daily. [provider] Taking Active Self  FLUoxetine (PROZAC) 20 MG capsule [id number] Yes Take 1 capsule (20 mg total) by mouth daily. Cox, Kirsten, MD Taking Active   irbesartan (AVAPRO) 150 MG tablet [id number] Yes TAKE 1 TABLET BY MOUTH DAILY Cox, Kirsten, MD Taking Active   nitroGLYCERIN (NITROSTAT) 0.4 MG SL tablet [id number]  Place 0.4 mg under the tongue every 5 (five) minutes as needed for chest pain. [provider]  Active            Med Note Julian Reil, Natausha Jungwirth   Fri Aug 21, 2023  1:27 PM) Patient states has on hand, has not taken it.  Denies CP, unless having COPD exacerbation  OXYGEN [id number] Yes Inhale 2 L into the lungs as needed (shortness of breath). [provider] Taking Active Self           Med Note Julian Reil, Milliani Herrada   Wed Sep 09, 2023  2:34 PM) Patient reports decreased O2 back to 2L, no symptoms reported.  pravastatin (PRAVACHOL) 20 MG tablet [id number] Yes TAKE 1 TABLET BY MOUTH DAILY Cox, Kirsten, MD Taking Active   propranolol ER (INDERAL LA) 120 MG 24  hr capsule [id number] Yes TAKE 1 CAPSULE BY MOUTH DAILY Cox, Kirsten, MD Taking Active             Recommendation:   PCP Follow-up  Follow Up Plan:   Face to Face appointment date/time: With Dr. Sedalia Muta on 06/10/25at 10:00  Telephone appointment with RNCM on 09/23/23 at 2:00  Ruel Favors BSN RN CCM [place]  [facility], [facility] RN Care Manager Direct Dial: [phone] Fax: [phone]

## 2023-09-17 DIAGNOSIS — Z87891 Personal history of nicotine dependence: Secondary | ICD-10-CM | POA: Diagnosis not present

## 2023-09-17 DIAGNOSIS — J449 Chronic obstructive pulmonary disease, unspecified: Secondary | ICD-10-CM | POA: Diagnosis not present

## 2023-09-17 DIAGNOSIS — R911 Solitary pulmonary nodule: Secondary | ICD-10-CM | POA: Diagnosis not present

## 2023-09-23 ENCOUNTER — Telehealth: Payer: Self-pay

## 2023-09-29 ENCOUNTER — Telehealth: Payer: Self-pay

## 2023-09-29 NOTE — Progress Notes (Signed)
 Complex Care Management Note Care Guide Note  10/01/2023 Name: [name] MRN: [id number] DOB: 1950-10-17   Complex Care Management Outreach Attempts: An unsuccessful outreach was attempted for an appointment today.  Follow Up Plan:  No further outreach attempts will be made at this time. We have been unable to contact the patient to offer or enroll patient in complex care management services.  Encounter Outcome:  No Answer  [facility]  [facility], [facility] Care Management Assistant Direct Dial: [phone]  Fax: [phone]

## 2023-10-10 ENCOUNTER — Other Ambulatory Visit: Payer: Self-pay | Admitting: Family Medicine

## 2023-11-09 ENCOUNTER — Other Ambulatory Visit: Payer: Self-pay | Admitting: Surgery

## 2023-11-09 DIAGNOSIS — I7122 Aneurysm of the aortic arch, without rupture: Secondary | ICD-10-CM

## 2023-11-09 NOTE — Progress Notes (Signed)
 Subjective:  Patient ID: Misty Evans, female    DOB: 22-May-1951  Age: 73 y.o. MRN: [id number]  Chief Complaint  Patient presents with   Medical Management of Chronic Issues    HPI: Hyperlipidemia/Aortic atherosclerosis:  Patient is currently taking Pravastatin  20 mg take 1 tablet daily.   Hypertension: Poorly controlled.Check bp daily. It is 180-190s/80-101. Patient has significant anxiety as she is having to take care for her disabled husband.  Patient saw cardiology, Dr. Lafayette Pierre.  Left heart cath showed mild to moderate nonobstructive coronary arteries. Patient is currently on irbesartan  150 mg daily, propranolol  er 120 mg once daily.    Depression/Anxiety:  Patient is currently on prozac  20 mg once daily.  Patient was given low-dose Xanax  by Dr. Lafayette Pierre.    COPD:  Patient has dyspnea on exertion. Using oxygen  @ 2L/min.  Trelegy helped the most. Failed breztri  and spiriva . Uses albuterol  nearly 4 times per day. Patient sees Dr. Corita Diego.       11/10/2023    9:55 AM 08/21/2023    1:20 PM 02/12/2023    2:32 PM 01/14/2023   10:49 AM 10/01/2022   11:14 AM  Depression screen PHQ 2/9  Decreased Interest 1 2  1 1   Down, Depressed, Hopeless 1 1  1 1   PHQ - 2 Score 2 3  2 2   Altered sleeping 1 0 1 1 0  Tired, decreased energy 1 1 1 3 3   Change in appetite 1 1 1 1 1   Feeling bad or failure about yourself  0 1 0 0 1  Trouble concentrating 0 1 0 0 0  Moving slowly or fidgety/restless 0 0 0 0 0  Suicidal thoughts  0 0 0 0  PHQ-9 Score 5 7  7 7   Difficult doing work/chores Not difficult at all Somewhat difficult   Somewhat difficult        09/09/2023    2:26 PM  Fall Risk   Falls in the past year? 0    Patient Care Team: Mercy Stall, MD as PCP - General (Family Medicine) Steven Elam, [facility] (Inactive) as Pharmacist (Pharmacist) Lajuan Pila, MD as Consulting Physician (Gastroenterology) Margaretann Sharper, MD as Consulting Physician (Pulmonary Disease) Revankar, Micael Adas, MD as Consulting Physician (Cardiology) Bartley Lightning, MD as Consulting Physician (Cardiothoracic Surgery) Clarnce Crow, RN as [facility] Care Management (General Practice) Clarnce Crow, RN   Review of Systems  Constitutional:  Positive for diaphoresis (night sweats). Negative for chills, fatigue and fever.  HENT:  Positive for rhinorrhea. Negative for congestion, ear pain and sore throat.   Respiratory:  Positive for shortness of breath. Negative for cough.   Cardiovascular:  Negative for chest pain.  Gastrointestinal:  Negative for abdominal pain, constipation, diarrhea, nausea and vomiting.       Soft stools after eating.   Genitourinary:  Negative for dysuria and urgency.  Musculoskeletal:  Positive for back pain. Negative for myalgias.  Neurological:  Negative for dizziness, weakness, light-headedness and headaches.  Psychiatric/Behavioral:  Negative for dysphoric mood. The patient is not nervous/anxious.     Current Outpatient Medications on File Prior to Visit  Medication Sig Dispense Refill   albuterol  (ACCUNEB ) 1.25 MG/3ML nebulizer solution Take 1 ampule by nebulization every 6 (six) hours as needed for wheezing.     aspirin  81 MG chewable tablet Chew 81 mg by mouth daily.     FLUoxetine  (PROZAC ) 20 MG capsule Take 1 capsule (20 mg total) by mouth daily. 30 capsule  0   irbesartan  (AVAPRO ) 150 MG tablet TAKE 1 TABLET BY MOUTH DAILY 100 tablet 2   nitroGLYCERIN  (NITROSTAT ) 0.4 MG SL tablet Place 0.4 mg under the tongue every 5 (five) minutes as needed for chest pain.     OXYGEN  Inhale 2 L into the lungs as needed (shortness of breath).     pravastatin  (PRAVACHOL ) 20 MG tablet TAKE 1 TABLET BY MOUTH DAILY 100 tablet 2   propranolol  ER (INDERAL  LA) 120 MG 24 hr capsule TAKE 1 CAPSULE BY MOUTH DAILY 100 capsule 0   No current facility-administered medications on file prior to visit.   Past Medical History:  Diagnosis Date   Abnormal weight loss 01/04/2020   Acute respiratory  failure with hypoxia (HCC) 10/05/2022   Allergic rhinitis due to allergen 07/03/2021   Aortic aneurysm of unspecified site, without rupture (HCC) 11/26/2022   Aortic atherosclerosis (HCC) 06/25/2021   COPD (chronic obstructive pulmonary disease) (HCC)    COPD exacerbation (HCC) 11/12/2019   Depression    Depression, major, recurrent, mild (HCC) 11/12/2019   Encounter for screening mammogram for malignant neoplasm of breast 10/05/2022   Essential hypertension 11/02/2019   Ex-smoker 11/26/2022   GERD (gastroesophageal reflux disease)    History of nephrolithiasis 02/04/2019   Insomnia    Mild protein-calorie malnutrition (HCC) 11/12/2019   Mixed dyslipidemia 11/02/2019   Mixed hyperlipidemia 11/02/2019   Need for immunization against influenza 04/06/2022   Nodule of left lung 01/04/2020   Osteoporosis    Renal stones 05/2015   Screening for lung cancer 10/05/2022   Shortness of breath 10/05/2022   Past Surgical History:  Procedure Laterality Date   ABDOMINAL HYSTERECTOMY     APPENDECTOMY     BACK SURGERY     x 2   CHOLECYSTECTOMY     COLONOSCOPY     LEFT HEART CATH AND CORONARY ANGIOGRAPHY N/A 12/01/2022   Procedure: LEFT HEART CATH AND CORONARY ANGIOGRAPHY;  Surgeon: Sammy Crisp, MD;  Location: [facility];  Service: Cardiovascular;  Laterality: N/A;    Family History  Problem Relation Age of Onset   Hypertension Mother    Cancer Father        esophageal   Hypertension Father    Hypertension Maternal Grandmother    Heart attack Maternal Grandmother    Diabetes Paternal Grandmother    Hypertension Paternal Grandmother    Colon cancer Neg Hx    Pancreatic cancer Neg Hx    Esophageal cancer Neg Hx    Rectal cancer Neg Hx    Stomach cancer Neg Hx    Social History   Socioeconomic History   Marital status: Married    Spouse name: Charles   Number of children: 2   Years of education: Not on file   Highest education level: Not on file  Occupational  History   Occupation: Retired  Tobacco Use   Smoking status: Former    Current packs/day: 0.00    Average packs/day: 0.3 packs/day for 54.0 years (13.5 ttl pk-yrs)    Types: Cigarettes    Start date: 03/04/1967    Quit date: 03/03/2021    Years since quitting: 2.7   Smokeless tobacco: Never   Tobacco comments:    Has smoked since 73 years old  Vaping Use   Vaping status: Some Days  Substance and Sexual Activity   Alcohol use: Not Currently   Drug use: Never   Sexual activity: Not on file  Other Topics Concern   Not on  file  Social History Narrative   Not on file   Social Drivers of Health   Financial Resource Strain: Low Risk  (02/12/2023)   Overall Financial Resource Strain (CARDIA)    Difficulty of Paying Living Expenses: Not hard at all  Food Insecurity: No Food Insecurity (08/21/2023)   Hunger Vital Sign    Worried About Running Out of Food in the Last Year: Never true    Ran Out of Food in the Last Year: Never true  Transportation Needs: No Transportation Needs (08/21/2023)   PRAPARE - Administrator, Civil Service (Medical): No    Lack of Transportation (Non-Medical): No  Physical Activity: Sufficiently Active (02/12/2023)   Exercise Vital Sign    Days of Exercise per Week: 5 days    Minutes of Exercise per Session: 60 min  Stress: Stress Concern Present (02/12/2023)   [employer] of Occupational Health - Occupational Stress Questionnaire    Feeling of Stress : To some extent  Social Connections: Moderately Isolated (02/12/2023)   Social Connection and Isolation Panel    Frequency of Communication with Friends and Family: Twice a week    Frequency of Social Gatherings with Friends and Family: Once a week    Attends Religious Services: Never    Diplomatic Services operational officer: No    Attends Engineer, structural: Never    Marital Status: Married    Objective:  BP (!) 180/100   Pulse 84   Temp 98.1 F (36.7 C)   Ht 4' 10 (1.473  m)   Wt 99 lb 6.4 oz (45.1 kg)   SpO2 94% Comment: 2L O2  BMI 20.77 kg/m      11/10/2023   10:02 AM 11/10/2023    9:52 AM 09/07/2023    2:46 PM  BP/Weight  Systolic BP 180 192 139  Diastolic BP 100 110 69  Wt. (Lbs)  99.4   BMI  20.77 kg/m2     Physical Exam Vitals reviewed.  Neck:     Vascular: No carotid bruit.   Cardiovascular:     Rate and Rhythm: Normal rate and regular rhythm.     Heart sounds: Normal heart sounds.  Pulmonary:     Effort: Pulmonary effort is normal. No respiratory distress.     Breath sounds: Wheezing present. No rhonchi.     Comments: Wearing oxygen  Abdominal:     General: Abdomen is flat. Bowel sounds are normal.     Palpations: Abdomen is soft.     Tenderness: There is no abdominal tenderness.   Neurological:     Mental Status: She is alert and oriented to person, place, and time.   Psychiatric:        Mood and Affect: Mood normal.        Behavior: Behavior normal.     Diabetic Foot Exam - Simple   No data filed      Lab Results  Component Value Date   WBC 6.8 11/10/2023   HGB 15.3 11/10/2023   HCT 48.5 (H) 11/10/2023   PLT 211 11/10/2023   GLUCOSE 78 11/10/2023   CHOL 158 11/10/2023   TRIG 70 11/10/2023   HDL 64 11/10/2023   LDLCALC 80 11/10/2023   ALT 8 11/10/2023   AST 23 11/10/2023   NA 142 11/10/2023   K 4.1 11/10/2023   CL 103 11/10/2023   CREATININE 1.06 (H) 11/10/2023   BUN 19 11/10/2023   CO2 24 11/10/2023   TSH 1.250  08/06/2023      Assessment & Plan:  Essential hypertension Assessment & Plan: Poorly controlled. INCREASE IRBESARTAN  150 MG TWO DAILY.  CHECK BP DAILY AND LOG.  FOLLOW UP IN 1-2 WEEKS.   Orders: -     CBC with Differential/Platelet -     Comprehensive metabolic panel with GFR  Mixed simple and mucopurulent chronic bronchitis (HCC) Assessment & Plan: Continue current medications. ON trelegy and albuterol .  Patient would benefit from change from albuterol  to airsupra.  Recommended  patient discuss with Dr. Corita Diego.    Gastroesophageal reflux disease without esophagitis  Mixed hyperlipidemia Assessment & Plan: At goal. Continue pravastatin  to 20 mg nightly Recommend continue to work on eating healthy diet.  Orders: -     Lipid panel  Encounter for screening mammogram for malignant neoplasm of breast Assessment & Plan: Order mammogram   Orders: -     3D Screening Mammogram, Left and Right; Future  Diaphoresis Assessment & Plan: Check 5HIAA.  Orders: -     5-HIAA Qn,Random Urine  Depression, major, recurrent, mild (HCC) Assessment & Plan: At goal.  Continue fluoxetine  20 mg daily for depression and anxiety. Has some Xanax  to take for severe anxiety.   Chronic respiratory failure with hypoxia (HCC) Assessment & Plan: Continue oxygen  at 2-3 L daily.       No orders of the defined types were placed in this encounter.   Orders Placed This Encounter  Procedures   MM 3D SCREENING MAMMOGRAM BILATERAL BREAST   CBC with Differential/Platelet   Comprehensive metabolic panel with GFR   Lipid panel   5-HIAA Qn,Random Urine     Follow-up: Return in about 8 days (around 11/18/2023) for Dina for htn. Gladys Lamp I Leal-Borjas,acting as a scribe for Mercy Stall, MD.,have documented all relevant documentation on the behalf of Mercy Stall, MD,as directed by  Mercy Stall, MD while in the presence of Mercy Stall, MD.   An After Visit Summary was printed and given to the patient.  I attest that I have reviewed this visit and agree with the plan scribed by my staff.   Mercy Stall, MD [facility] [phone]

## 2023-11-10 ENCOUNTER — Ambulatory Visit (INDEPENDENT_AMBULATORY_CARE_PROVIDER_SITE_OTHER): Admitting: Family Medicine

## 2023-11-10 ENCOUNTER — Encounter: Payer: Self-pay | Admitting: Family Medicine

## 2023-11-10 VITALS — BP 180/100 | HR 84 | Temp 98.1°F | Ht <= 58 in | Wt 99.4 lb

## 2023-11-10 DIAGNOSIS — E782 Mixed hyperlipidemia: Secondary | ICD-10-CM

## 2023-11-10 DIAGNOSIS — K219 Gastro-esophageal reflux disease without esophagitis: Secondary | ICD-10-CM

## 2023-11-10 DIAGNOSIS — Z1231 Encounter for screening mammogram for malignant neoplasm of breast: Secondary | ICD-10-CM | POA: Diagnosis not present

## 2023-11-10 DIAGNOSIS — R61 Generalized hyperhidrosis: Secondary | ICD-10-CM

## 2023-11-10 DIAGNOSIS — J4489 Other specified chronic obstructive pulmonary disease: Secondary | ICD-10-CM

## 2023-11-10 DIAGNOSIS — I10 Essential (primary) hypertension: Secondary | ICD-10-CM | POA: Diagnosis not present

## 2023-11-10 DIAGNOSIS — J418 Mixed simple and mucopurulent chronic bronchitis: Secondary | ICD-10-CM | POA: Diagnosis not present

## 2023-11-10 DIAGNOSIS — J9611 Chronic respiratory failure with hypoxia: Secondary | ICD-10-CM

## 2023-11-10 DIAGNOSIS — F330 Major depressive disorder, recurrent, mild: Secondary | ICD-10-CM

## 2023-11-10 NOTE — Patient Instructions (Addendum)
 INCREASE IRBESARTAN  150 MG TWO DAILY.   CHECK BP DAILY AND LOG.   FOLLOW UP IN 1-2 WEEKS.

## 2023-11-12 LAB — CBC WITH DIFFERENTIAL/PLATELET
Basophils Absolute: 0.1 10*3/uL (ref 0.0–0.2)
Basos: 1 %
EOS (ABSOLUTE): 0.2 10*3/uL (ref 0.0–0.4)
Eos: 3 %
Hematocrit: 48.5 % — ABNORMAL HIGH (ref 34.0–46.6)
Hemoglobin: 15.3 g/dL (ref 11.1–15.9)
Immature Grans (Abs): 0.1 10*3/uL (ref 0.0–0.1)
Immature Granulocytes: 1 %
Lymphocytes Absolute: 1.4 10*3/uL (ref 0.7–3.1)
Lymphs: 21 %
MCH: 28.9 pg (ref 26.6–33.0)
MCHC: 31.5 g/dL (ref 31.5–35.7)
MCV: 92 fL (ref 79–97)
Monocytes Absolute: 0.5 10*3/uL (ref 0.1–0.9)
Monocytes: 7 %
Neutrophils Absolute: 4.6 10*3/uL (ref 1.4–7.0)
Neutrophils: 67 %
Platelets: 211 10*3/uL (ref 150–450)
RBC: 5.29 x10E6/uL — ABNORMAL HIGH (ref 3.77–5.28)
RDW: 13.4 % (ref 11.7–15.4)
WBC: 6.8 10*3/uL (ref 3.4–10.8)

## 2023-11-12 LAB — COMPREHENSIVE METABOLIC PANEL WITH GFR
ALT: 8 IU/L (ref 0–32)
AST: 23 IU/L (ref 0–40)
Albumin: 4.2 g/dL (ref 3.8–4.8)
Alkaline Phosphatase: 59 IU/L (ref 44–121)
BUN/Creatinine Ratio: 18 (ref 12–28)
BUN: 19 mg/dL (ref 8–27)
Bilirubin Total: 1.1 mg/dL (ref 0.0–1.2)
CO2: 24 mmol/L (ref 20–29)
Calcium: 9.5 mg/dL (ref 8.7–10.3)
Chloride: 103 mmol/L (ref 96–106)
Creatinine, Ser: 1.06 mg/dL — ABNORMAL HIGH (ref 0.57–1.00)
Globulin, Total: 2.7 g/dL (ref 1.5–4.5)
Glucose: 78 mg/dL (ref 70–99)
Potassium: 4.1 mmol/L (ref 3.5–5.2)
Sodium: 142 mmol/L (ref 134–144)
Total Protein: 6.9 g/dL (ref 6.0–8.5)
eGFR: 55 mL/min/{1.73_m2} — ABNORMAL LOW (ref >59)

## 2023-11-12 LAB — LIPID PANEL
Chol/HDL Ratio: 2.5 ratio (ref 0.0–4.4)
Cholesterol, Total: 158 mg/dL (ref 100–199)
HDL: 64 mg/dL (ref >39)
LDL Chol Calc (NIH): 80 mg/dL (ref 0–99)
Triglycerides: 70 mg/dL (ref 0–149)
VLDL Cholesterol Cal: 14 mg/dL (ref 5–40)

## 2023-11-12 LAB — 5-HIAA QN,RANDOM URINE
5-HIAA, Urine: 8.8 mg/L
5-HIAA,Qn,Random,Ur: 5.4 mg/g{creat} (ref 0.0–6.9)
Creatinine, Random U: 162.1 mg/dL

## 2023-11-13 ENCOUNTER — Ambulatory Visit: Payer: Self-pay | Admitting: Family Medicine

## 2023-11-14 DIAGNOSIS — Z1231 Encounter for screening mammogram for malignant neoplasm of breast: Secondary | ICD-10-CM | POA: Insufficient documentation

## 2023-11-14 DIAGNOSIS — R61 Generalized hyperhidrosis: Secondary | ICD-10-CM | POA: Insufficient documentation

## 2023-11-14 NOTE — Assessment & Plan Note (Signed)
 Continue oxygen  at 2-3 L daily.

## 2023-11-14 NOTE — Assessment & Plan Note (Signed)
 Poorly controlled. INCREASE IRBESARTAN  150 MG TWO DAILY.  CHECK BP DAILY AND LOG.  FOLLOW UP IN 1-2 WEEKS.

## 2023-11-14 NOTE — Assessment & Plan Note (Signed)
Check 5-HIAA

## 2023-11-14 NOTE — Assessment & Plan Note (Signed)
 Order mammogram

## 2023-11-14 NOTE — Assessment & Plan Note (Addendum)
 At goal. Continue pravastatin  to 20 mg nightly Recommend continue to work on eating healthy diet.

## 2023-11-14 NOTE — Assessment & Plan Note (Signed)
 Continue current medications. ON trelegy and albuterol .  Patient would benefit from change from albuterol  to airsupra.  Recommended patient discuss with Dr. Corita Diego.

## 2023-11-14 NOTE — Assessment & Plan Note (Signed)
 At goal.  Continue fluoxetine  20 mg daily for depression and anxiety. Has some Xanax  to take for severe anxiety.

## 2023-11-18 ENCOUNTER — Encounter: Payer: Self-pay | Admitting: Family Medicine

## 2023-11-18 ENCOUNTER — Other Ambulatory Visit: Payer: Self-pay | Admitting: Surgery

## 2023-11-18 ENCOUNTER — Ambulatory Visit
Admission: RE | Admit: 2023-11-18 | Discharge: 2023-11-18 | Disposition: A | Discharge status: Home / Self Care | Source: Ambulatory Visit | Attending: Family Medicine | Admitting: Family Medicine

## 2023-11-18 ENCOUNTER — Ambulatory Visit (INDEPENDENT_AMBULATORY_CARE_PROVIDER_SITE_OTHER): Admitting: Family Medicine

## 2023-11-18 VITALS — BP 120/82 | HR 61 | Temp 97.8°F | Resp 16 | Ht <= 58 in | Wt 96.2 lb

## 2023-11-18 DIAGNOSIS — Z1231 Encounter for screening mammogram for malignant neoplasm of breast: Secondary | ICD-10-CM | POA: Diagnosis not present

## 2023-11-18 DIAGNOSIS — W19XXXA Unspecified fall, initial encounter: Secondary | ICD-10-CM

## 2023-11-18 DIAGNOSIS — E782 Mixed hyperlipidemia: Secondary | ICD-10-CM | POA: Diagnosis not present

## 2023-11-18 DIAGNOSIS — J418 Mixed simple and mucopurulent chronic bronchitis: Secondary | ICD-10-CM | POA: Diagnosis not present

## 2023-11-18 DIAGNOSIS — I10 Essential (primary) hypertension: Secondary | ICD-10-CM | POA: Diagnosis not present

## 2023-11-18 DIAGNOSIS — I7122 Aneurysm of the aortic arch, without rupture: Secondary | ICD-10-CM

## 2023-11-18 MED ORDER — IRBESARTAN 150 MG PO TABS
300.0000 mg | ORAL_TABLET | Freq: Every day | ORAL | 2 refills | Status: DC
Start: 2023-11-18 — End: 2023-11-27

## 2023-11-18 NOTE — Progress Notes (Signed)
 Subjective:  Patient ID: Misty Evans, female    DOB: 08-Sep-1950  Age: 73 y.o. MRN: [id number]  Chief Complaint  Patient presents with   Follow-up   Hypertension    Discussed the use of AI scribe software for clinical note transcription with the patient, who gave verbal consent to proceed.  History of Present Illness   Misty Evans is a 73 year old female with diabetes, hypertension, and hyperlipidemia who presents for follow-up of blood pressure management and recent fall.  She experienced shortness of breath and chest tightness on Friday and Saturday, describing it as a squeezing sensation in her chest. She also had headaches, which she attributes to allergies. No dizziness or lightheadedness. She is currently taking irbesartan  300 mg daily, increased from 150 mg daily.  Her recent blood pressure readings have been 120/82 mmHg, with previous readings as high as 180/100 mmHg. She notes that lower readings often occur when she is sitting and watching TV, not engaging in physical activity.  She experienced a fall on Friday night in her kitchen, tripping over an object not usually in that place. She fell on her side, bruising her arm and torso, but did not hit her head. She reports soreness over the weekend but no broken bones. This is her fourth fall in the past three years, and she tries to be cautious to avoid further incidents.  Her recent lab work showed stable results, with an improved GFR from 52 to 55, normal urine results, and good cholesterol levels, including an LDL of 80, total cholesterol of 158, and HDL of 64.          11/10/2023    9:55 AM 08/21/2023    1:20 PM 02/12/2023    2:32 PM 01/14/2023   10:49 AM 10/01/2022   11:14 AM  Depression screen PHQ 2/9  Decreased Interest 1 2  1 1   Down, Depressed, Hopeless 1 1  1 1   PHQ - 2 Score 2 3  2 2   Altered sleeping 1 0 1 1 0  Tired, decreased energy 1 1 1 3 3   Change in appetite 1 1 1 1 1   Feeling bad or  failure about yourself  0 1 0 0 1  Trouble concentrating 0 1 0 0 0  Moving slowly or fidgety/restless 0 0 0 0 0  Suicidal thoughts  0 0 0 0  PHQ-9 Score 5 7  7 7   Difficult doing work/chores Not difficult at all Somewhat difficult   Somewhat difficult        11/18/2023    9:37 AM  Fall Risk   Falls in the past year? 1  Number falls in past yr: 0  Injury with Fall? 1  Risk for fall due to : History of fall(s)    Patient Care Team: Mercy Stall, MD as PCP - General (Family Medicine) Steven Elam, [facility] (Inactive) as Pharmacist (Pharmacist) Lajuan Pila, MD as Consulting Physician (Gastroenterology) Margaretann Sharper, MD as Consulting Physician (Pulmonary Disease) Revankar, Micael Adas, MD as Consulting Physician (Cardiology) Bartley Lightning, MD as Consulting Physician (Cardiothoracic Surgery) Clarnce Crow, RN as [facility] Care Management (General Practice) Clarnce Crow, RN   Review of Systems  Constitutional:  Negative for chills, diaphoresis, fatigue and fever.  HENT:  Negative for congestion, ear pain and sinus pain.   Eyes: Negative.   Respiratory:  Positive for chest tightness and shortness of breath. Negative for cough.   Cardiovascular:  Positive for chest pain.  Gastrointestinal:  Negative for abdominal pain, constipation, nausea and vomiting.  Endocrine: Negative.   Genitourinary:  Negative for dysuria.  Musculoskeletal:  Negative for arthralgias.  Skin: Negative.   Allergic/Immunologic: Negative.   Neurological:  Positive for headaches. Negative for dizziness, weakness and light-headedness.  Hematological: Negative.   Psychiatric/Behavioral:  Negative for dysphoric mood. The patient is not nervous/anxious.     Current Outpatient Medications on File Prior to Visit  Medication Sig Dispense Refill   albuterol  (ACCUNEB ) 1.25 MG/3ML nebulizer solution Take 1 ampule by nebulization every 6 (six) hours as needed for wheezing.     aspirin  81 MG chewable tablet Chew 81 mg by  mouth daily.     FLUoxetine  (PROZAC ) 20 MG capsule Take 1 capsule (20 mg total) by mouth daily. 30 capsule 0   isosorbide  mononitrate (IMDUR ) 30 MG 24 hr tablet Take 15 mg by mouth daily.     nitroGLYCERIN  (NITROSTAT ) 0.4 MG SL tablet Place 0.4 mg under the tongue every 5 (five) minutes as needed for chest pain.     OXYGEN  Inhale 2 L into the lungs as needed (shortness of breath).     pravastatin  (PRAVACHOL ) 20 MG tablet TAKE 1 TABLET BY MOUTH DAILY 100 tablet 2   propranolol  ER (INDERAL  LA) 120 MG 24 hr capsule TAKE 1 CAPSULE BY MOUTH DAILY 100 capsule 0   No current facility-administered medications on file prior to visit.   Past Medical History:  Diagnosis Date   Abnormal weight loss 01/04/2020   Acute respiratory failure with hypoxia (HCC) 10/05/2022   Allergic rhinitis due to allergen 07/03/2021   Aortic aneurysm of unspecified site, without rupture (HCC) 11/26/2022   Aortic atherosclerosis (HCC) 06/25/2021   COPD (chronic obstructive pulmonary disease) (HCC)    COPD exacerbation (HCC) 11/12/2019   Depression    Depression, major, recurrent, mild (HCC) 11/12/2019   Encounter for screening mammogram for malignant neoplasm of breast 10/05/2022   Essential hypertension 11/02/2019   Ex-smoker 11/26/2022   GERD (gastroesophageal reflux disease)    History of nephrolithiasis 02/04/2019   Insomnia    Mild protein-calorie malnutrition (HCC) 11/12/2019   Mixed dyslipidemia 11/02/2019   Mixed hyperlipidemia 11/02/2019   Need for immunization against influenza 04/06/2022   Nodule of left lung 01/04/2020   Osteoporosis    Renal stones 05/2015   Screening for lung cancer 10/05/2022   Shortness of breath 10/05/2022   Past Surgical History:  Procedure Laterality Date   ABDOMINAL HYSTERECTOMY     APPENDECTOMY     BACK SURGERY     x 2   CHOLECYSTECTOMY     COLONOSCOPY     LEFT HEART CATH AND CORONARY ANGIOGRAPHY N/A 12/01/2022   Procedure: LEFT HEART CATH AND CORONARY ANGIOGRAPHY;   Surgeon: Sammy Crisp, MD;  Location: [facility];  Service: Cardiovascular;  Laterality: N/A;    Family History  Problem Relation Age of Onset   Hypertension Mother    Cancer Father        esophageal   Hypertension Father    Hypertension Maternal Grandmother    Heart attack Maternal Grandmother    Diabetes Paternal Grandmother    Hypertension Paternal Grandmother    Colon cancer Neg Hx    Pancreatic cancer Neg Hx    Esophageal cancer Neg Hx    Rectal cancer Neg Hx    Stomach cancer Neg Hx    Social History   Socioeconomic History   Marital status: Married    Spouse name: Ethelle Herb  Number of children: 2   Years of education: Not on file   Highest education level: Not on file  Occupational History   Occupation: Retired  Tobacco Use   Smoking status: Former    Current packs/day: 0.00    Average packs/day: 0.3 packs/day for 54.0 years (13.5 ttl pk-yrs)    Types: Cigarettes    Start date: 03/04/1967    Quit date: 03/03/2021    Years since quitting: 2.7   Smokeless tobacco: Never   Tobacco comments:    Has smoked since 73 years old  Vaping Use   Vaping status: Some Days  Substance and Sexual Activity   Alcohol use: Not Currently   Drug use: Never   Sexual activity: Not on file  Other Topics Concern   Not on file  Social History Narrative   Not on file   Social Drivers of Health   Financial Resource Strain: Low Risk  (02/12/2023)   Overall Financial Resource Strain (CARDIA)    Difficulty of Paying Living Expenses: Not hard at all  Food Insecurity: No Food Insecurity (08/21/2023)   Hunger Vital Sign    Worried About Running Out of Food in the Last Year: Never true    Ran Out of Food in the Last Year: Never true  Transportation Needs: No Transportation Needs (08/21/2023)   PRAPARE - Administrator, Civil Service (Medical): No    Lack of Transportation (Non-Medical): No  Physical Activity: Sufficiently Active (02/12/2023)   Exercise Vital Sign     Days of Exercise per Week: 5 days    Minutes of Exercise per Session: 60 min  Stress: Stress Concern Present (02/12/2023)   [employer] of Occupational Health - Occupational Stress Questionnaire    Feeling of Stress : To some extent  Social Connections: Moderately Isolated (02/12/2023)   Social Connection and Isolation Panel    Frequency of Communication with Friends and Family: Twice a week    Frequency of Social Gatherings with Friends and Family: Once a week    Attends Religious Services: Never    Diplomatic Services operational officer: No    Attends Engineer, structural: Never    Marital Status: Married    Objective:  BP 120/82   Pulse 61   Temp 97.8 F (36.6 C) (Temporal)   Resp 16   Ht 4' 10 (1.473 m)   Wt 96 lb 3.2 oz (43.6 kg)   SpO2 95%   BMI 20.11 kg/m      11/18/2023    9:09 AM 11/10/2023   10:02 AM 11/10/2023    9:52 AM  BP/Weight  Systolic BP 120 180 192  Diastolic BP 82 100 110  Wt. (Lbs) 96.2  99.4  BMI 20.11 kg/m2  20.77 kg/m2    Physical Exam Vitals reviewed.  Constitutional:      General: She is not in acute distress.    Appearance: Normal appearance. She is not ill-appearing.   Eyes:     Conjunctiva/sclera: Conjunctivae normal.    Cardiovascular:     Rate and Rhythm: Normal rate and regular rhythm.     Heart sounds: Normal heart sounds. No murmur heard. Pulmonary:     Effort: Pulmonary effort is normal.     Breath sounds: Normal breath sounds. No wheezing.  Abdominal:     General: Bowel sounds are normal.   Musculoskeletal:        General: Swelling present.     Right knee: Swelling present. Tenderness present.  Left knee: Swelling present. Tenderness present.   Skin:    Findings: Bruising present.   Neurological:     Mental Status: She is alert. Mental status is at baseline.   Psychiatric:        Mood and Affect: Mood normal.        Behavior: Behavior normal.       Lab Results  Component Value Date   WBC  6.8 11/10/2023   HGB 15.3 11/10/2023   HCT 48.5 (H) 11/10/2023   PLT 211 11/10/2023   GLUCOSE 78 11/10/2023   CHOL 158 11/10/2023   TRIG 70 11/10/2023   HDL 64 11/10/2023   LDLCALC 80 11/10/2023   ALT 8 11/10/2023   AST 23 11/10/2023   NA 142 11/10/2023   K 4.1 11/10/2023   CL 103 11/10/2023   CREATININE 1.06 (H) 11/10/2023   BUN 19 11/10/2023   CO2 24 11/10/2023   TSH 1.250 08/06/2023      Assessment & Plan:  Mixed hyperlipidemia Assessment & Plan: At goal. Lab Results  Component Value Date   LDLCALC 80 11/10/2023   Continue pravastatin  to 20 mg nightly Recommend continue to work on eating healthy diet.   Essential hypertension Assessment & Plan: Well controlled Blood pressure controlled with irbesartan  300 mg daily. No dizziness or lightheadedness. Headaches possibly related to hypertension or allergies. BP Readings from Last 3 Encounters:  11/18/23 120/82  11/10/23 (!) 180/100  09/07/23 139/69   - Continue irbesartan  300 mg daily. - Monitor blood pressure regularly. - If systolic SBP <100 or dizziness occurs, reduce irbesartan  to 150 mg and contact clinic. - Refill irbesartan  prescription.  Orders: -     Irbesartan ; Take 2 tablets (300 mg total) by mouth daily.  Dispense: 100 tablet; Refill: 2  Mixed simple and mucopurulent chronic bronchitis (HCC) Assessment & Plan: Continue current medications. Taking trelegy and albuterol  and on oxygen  Intermittent chest pain with shortness of breath, no recent episodes. - Takes nitroglycerin  tablet for angina Managed by specialist, Dr. Corita Diego.    Fall, initial encounter Assessment & Plan: Fall on Friday in the kitchen - Patient is sore and did not hit her head, bruising noted on the knees with some swelling.  - Will continue to monitor closely      Meds ordered this encounter  Medications   irbesartan  (AVAPRO ) 150 MG tablet    Sig: Take 2 tablets (300 mg total) by mouth daily.    Dispense:  100 tablet     Refill:  2    Please send a replace/new response with 100-Day Supply if appropriate to maximize member benefit. Requesting 1 year supply.       Follow-up: Return in about 3 months (around 02/18/2024) for chronic.   I,Angela Taylor,acting as a Neurosurgeon for Janece Means, FNP.,have documented all relevant documentation on the behalf of Janece Means, FNP,as directed by  Janece Means, FNP while in the presence of Janece Means, FNP.   An After Visit Summary was printed and given to the patient.  I attest that I have reviewed this visit and agree with the plan scribed by my staff.   Janece Means, FNP [facility] [phone]

## 2023-11-18 NOTE — Assessment & Plan Note (Signed)
 Fall on Friday in the kitchen - Patient is sore and did not hit her head, bruising noted on the knees with some swelling.  - Will continue to monitor closely

## 2023-11-18 NOTE — Assessment & Plan Note (Signed)
 Well controlled Blood pressure controlled with irbesartan  300 mg daily. No dizziness or lightheadedness. Headaches possibly related to hypertension or allergies. BP Readings from Last 3 Encounters:  11/18/23 120/82  11/10/23 (!) 180/100  09/07/23 139/69   - Continue irbesartan  300 mg daily. - Monitor blood pressure regularly. - If systolic SBP <100 or dizziness occurs, reduce irbesartan  to 150 mg and contact clinic. - Refill irbesartan  prescription.

## 2023-11-18 NOTE — Assessment & Plan Note (Addendum)
 Continue current medications. Taking trelegy and albuterol  and on oxygen  Intermittent chest pain with shortness of breath, no recent episodes. - Takes nitroglycerin  tablet for angina Managed by specialist, Dr. Corita Diego.

## 2023-11-18 NOTE — Assessment & Plan Note (Signed)
 At goal. Lab Results  Component Value Date   LDLCALC 80 11/10/2023   Continue pravastatin  to 20 mg nightly Recommend continue to work on eating healthy diet.

## 2023-11-21 DIAGNOSIS — J440 Chronic obstructive pulmonary disease with acute lower respiratory infection: Secondary | ICD-10-CM | POA: Diagnosis not present

## 2023-11-21 DIAGNOSIS — Z4682 Encounter for fitting and adjustment of non-vascular catheter: Secondary | ICD-10-CM | POA: Diagnosis not present

## 2023-11-21 DIAGNOSIS — Z91199 Patient's noncompliance with other medical treatment and regimen due to unspecified reason: Secondary | ICD-10-CM | POA: Diagnosis not present

## 2023-11-21 DIAGNOSIS — I10 Essential (primary) hypertension: Secondary | ICD-10-CM | POA: Diagnosis not present

## 2023-11-21 DIAGNOSIS — Z7982 Long term (current) use of aspirin: Secondary | ICD-10-CM | POA: Diagnosis not present

## 2023-11-21 DIAGNOSIS — R918 Other nonspecific abnormal finding of lung field: Secondary | ICD-10-CM | POA: Diagnosis not present

## 2023-11-21 DIAGNOSIS — N170 Acute kidney failure with tubular necrosis: Secondary | ICD-10-CM | POA: Diagnosis not present

## 2023-11-21 DIAGNOSIS — R0902 Hypoxemia: Secondary | ICD-10-CM | POA: Diagnosis not present

## 2023-11-21 DIAGNOSIS — R6521 Severe sepsis with septic shock: Secondary | ICD-10-CM | POA: Diagnosis not present

## 2023-11-21 DIAGNOSIS — E785 Hyperlipidemia, unspecified: Secondary | ICD-10-CM | POA: Diagnosis not present

## 2023-11-21 DIAGNOSIS — I517 Cardiomegaly: Secondary | ICD-10-CM | POA: Diagnosis not present

## 2023-11-21 DIAGNOSIS — Z79899 Other long term (current) drug therapy: Secondary | ICD-10-CM | POA: Diagnosis not present

## 2023-11-21 DIAGNOSIS — I491 Atrial premature depolarization: Secondary | ICD-10-CM | POA: Diagnosis not present

## 2023-11-21 DIAGNOSIS — J9690 Respiratory failure, unspecified, unspecified whether with hypoxia or hypercapnia: Secondary | ICD-10-CM | POA: Diagnosis not present

## 2023-11-21 DIAGNOSIS — R051 Acute cough: Secondary | ICD-10-CM | POA: Diagnosis not present

## 2023-11-21 DIAGNOSIS — R0602 Shortness of breath: Secondary | ICD-10-CM | POA: Diagnosis not present

## 2023-11-21 DIAGNOSIS — J4489 Other specified chronic obstructive pulmonary disease: Secondary | ICD-10-CM | POA: Diagnosis not present

## 2023-11-21 DIAGNOSIS — Z452 Encounter for adjustment and management of vascular access device: Secondary | ICD-10-CM | POA: Diagnosis not present

## 2023-11-21 DIAGNOSIS — E871 Hypo-osmolality and hyponatremia: Secondary | ICD-10-CM | POA: Diagnosis not present

## 2023-11-21 DIAGNOSIS — J9622 Acute and chronic respiratory failure with hypercapnia: Secondary | ICD-10-CM | POA: Diagnosis not present

## 2023-11-21 DIAGNOSIS — Z880 Allergy status to penicillin: Secondary | ICD-10-CM | POA: Diagnosis not present

## 2023-11-21 DIAGNOSIS — I361 Nonrheumatic tricuspid (valve) insufficiency: Secondary | ICD-10-CM | POA: Diagnosis not present

## 2023-11-21 DIAGNOSIS — Z0389 Encounter for observation for other suspected diseases and conditions ruled out: Secondary | ICD-10-CM | POA: Diagnosis not present

## 2023-11-21 DIAGNOSIS — Z681 Body mass index (BMI) 19 or less, adult: Secondary | ICD-10-CM | POA: Diagnosis not present

## 2023-11-21 DIAGNOSIS — R6520 Severe sepsis without septic shock: Secondary | ICD-10-CM | POA: Diagnosis not present

## 2023-11-21 DIAGNOSIS — Z7951 Long term (current) use of inhaled steroids: Secondary | ICD-10-CM | POA: Diagnosis not present

## 2023-11-21 DIAGNOSIS — R9431 Abnormal electrocardiogram [ECG] [EKG]: Secondary | ICD-10-CM | POA: Diagnosis not present

## 2023-11-21 DIAGNOSIS — R5381 Other malaise: Secondary | ICD-10-CM | POA: Diagnosis not present

## 2023-11-21 DIAGNOSIS — R0981 Nasal congestion: Secondary | ICD-10-CM | POA: Diagnosis not present

## 2023-11-21 DIAGNOSIS — J690 Pneumonitis due to inhalation of food and vomit: Secondary | ICD-10-CM | POA: Diagnosis not present

## 2023-11-21 DIAGNOSIS — R34 Anuria and oliguria: Secondary | ICD-10-CM | POA: Diagnosis not present

## 2023-11-21 DIAGNOSIS — R64 Cachexia: Secondary | ICD-10-CM | POA: Diagnosis not present

## 2023-11-21 DIAGNOSIS — N179 Acute kidney failure, unspecified: Secondary | ICD-10-CM | POA: Diagnosis not present

## 2023-11-21 DIAGNOSIS — Z515 Encounter for palliative care: Secondary | ICD-10-CM | POA: Diagnosis not present

## 2023-11-21 DIAGNOSIS — N3001 Acute cystitis with hematuria: Secondary | ICD-10-CM | POA: Diagnosis not present

## 2023-11-21 DIAGNOSIS — A419 Sepsis, unspecified organism: Secondary | ICD-10-CM | POA: Diagnosis not present

## 2023-11-21 DIAGNOSIS — Z9981 Dependence on supplemental oxygen: Secondary | ICD-10-CM | POA: Diagnosis not present

## 2023-11-21 DIAGNOSIS — K921 Melena: Secondary | ICD-10-CM | POA: Diagnosis not present

## 2023-11-21 DIAGNOSIS — J441 Chronic obstructive pulmonary disease with (acute) exacerbation: Secondary | ICD-10-CM | POA: Diagnosis not present

## 2023-11-21 DIAGNOSIS — I110 Hypertensive heart disease with heart failure: Secondary | ICD-10-CM | POA: Diagnosis not present

## 2023-11-21 DIAGNOSIS — R112 Nausea with vomiting, unspecified: Secondary | ICD-10-CM | POA: Diagnosis not present

## 2023-11-21 DIAGNOSIS — J9620 Acute and chronic respiratory failure, unspecified whether with hypoxia or hypercapnia: Secondary | ICD-10-CM | POA: Diagnosis not present

## 2023-11-21 DIAGNOSIS — E8721 Acute metabolic acidosis: Secondary | ICD-10-CM | POA: Diagnosis not present

## 2023-11-21 DIAGNOSIS — I272 Pulmonary hypertension, unspecified: Secondary | ICD-10-CM | POA: Diagnosis not present

## 2023-11-21 DIAGNOSIS — D751 Secondary polycythemia: Secondary | ICD-10-CM | POA: Diagnosis not present

## 2023-11-21 DIAGNOSIS — I5032 Chronic diastolic (congestive) heart failure: Secondary | ICD-10-CM | POA: Diagnosis not present

## 2023-11-21 DIAGNOSIS — J9621 Acute and chronic respiratory failure with hypoxia: Secondary | ICD-10-CM | POA: Diagnosis not present

## 2023-11-21 DIAGNOSIS — R1110 Vomiting, unspecified: Secondary | ICD-10-CM | POA: Diagnosis not present

## 2023-11-21 DIAGNOSIS — Z9911 Dependence on respirator [ventilator] status: Secondary | ICD-10-CM | POA: Diagnosis not present

## 2023-11-22 DIAGNOSIS — I361 Nonrheumatic tricuspid (valve) insufficiency: Secondary | ICD-10-CM | POA: Diagnosis not present

## 2023-12-01 DEATH — deceased

## 2023-12-07 ENCOUNTER — Encounter: Payer: Self-pay | Admitting: Internal Medicine

## 2023-12-16 ENCOUNTER — Other Ambulatory Visit (HOSPITAL_BASED_OUTPATIENT_CLINIC_OR_DEPARTMENT_OTHER): Admitting: Radiology

## 2023-12-23 ENCOUNTER — Other Ambulatory Visit (HOSPITAL_COMMUNITY)

## 2023-12-30 ENCOUNTER — Ambulatory Visit: Admitting: Surgery

## 2024-02-29 ENCOUNTER — Ambulatory Visit: Admitting: Family Medicine

## 2024-03-03 IMAGING — MG MM DIGITAL SCREENING BILAT W/ TOMO AND CAD
8 series · 9 of 24 positions shown · non-contrast
Comparison: Previous exam(s).

CLINICAL DATA: Screening.

EXAM:
DIGITAL SCREENING BILATERAL MAMMOGRAM WITH TOMOSYNTHESIS AND CAD
TECHNIQUE: Bilateral screening digital craniocaudal and mediolateral oblique
mammograms were obtained. Bilateral screening digital breast
tomosynthesis was performed. The images were evaluated with
computer-aided detection.

[L MLO synth-2D]
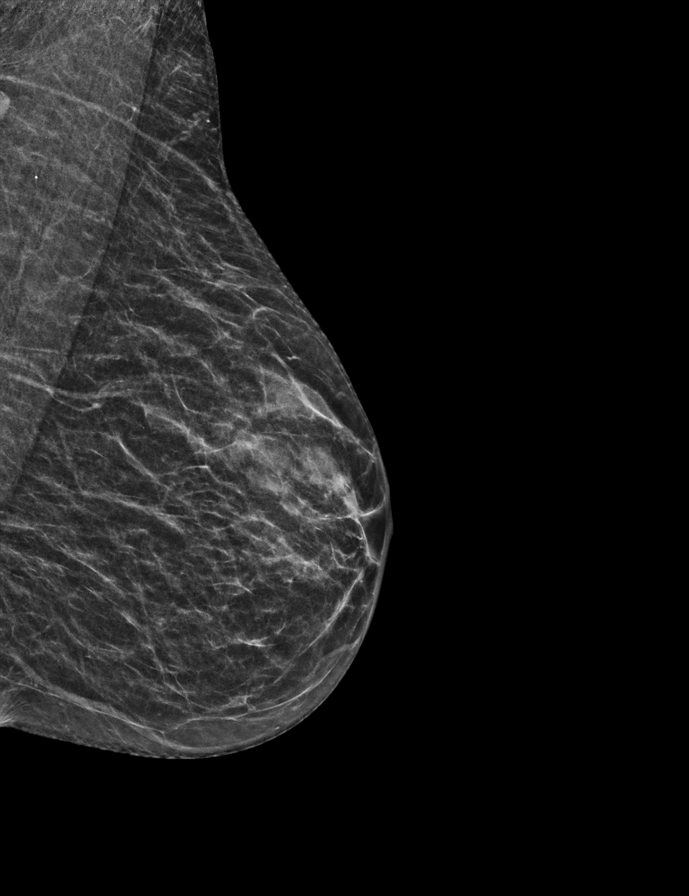

[L CC synth-2D]
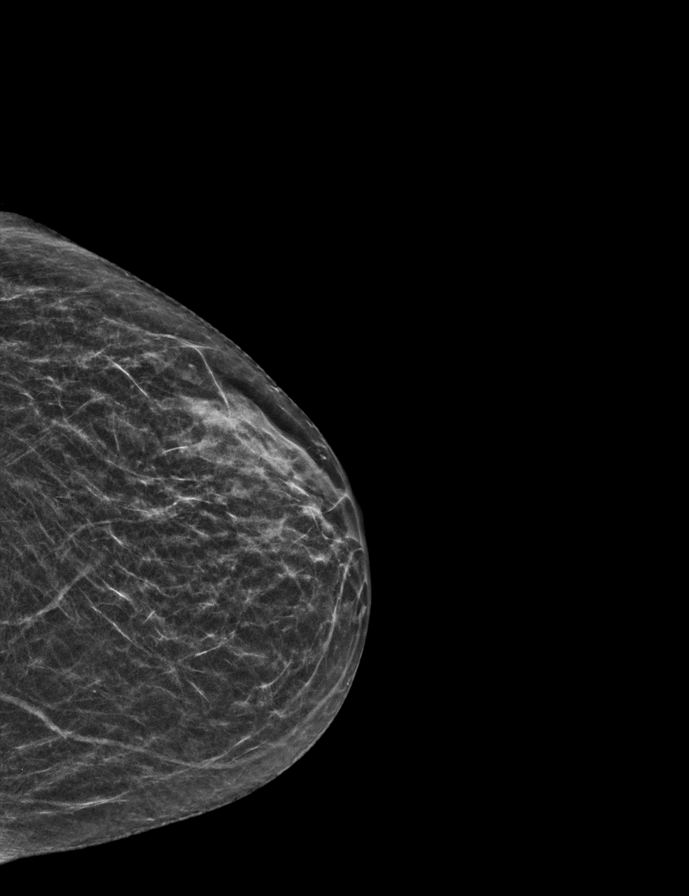

[R CC synth-2D]
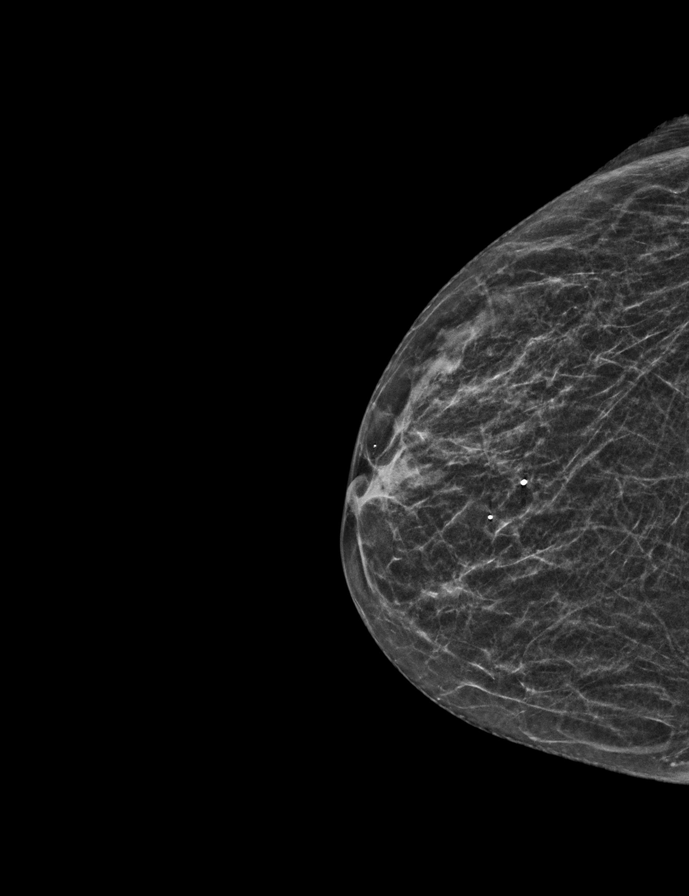

[R MLO synth-2D]
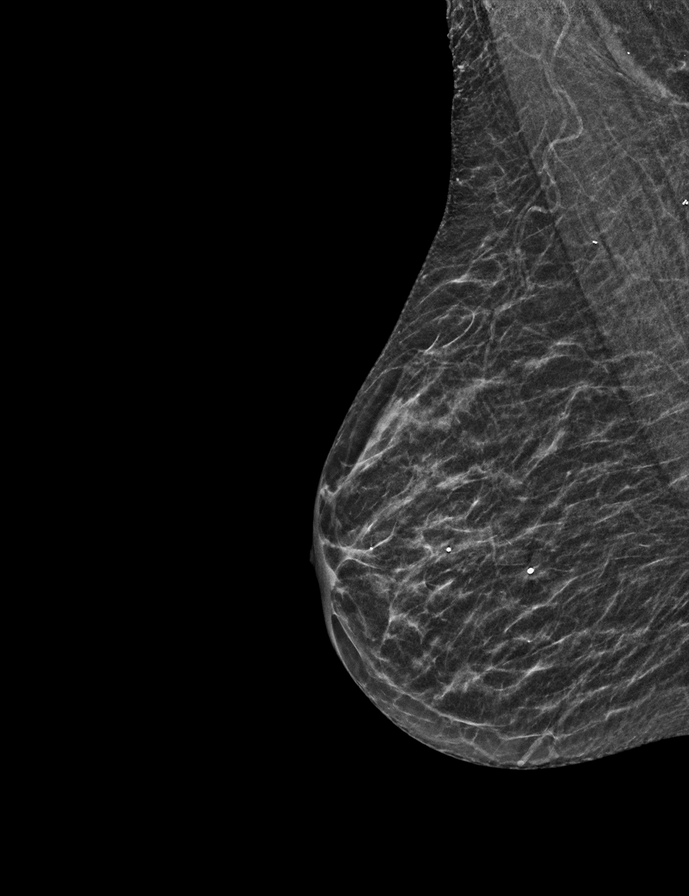

[L CC tomo · 2 of 40 frames shown]
[frame 13/40]
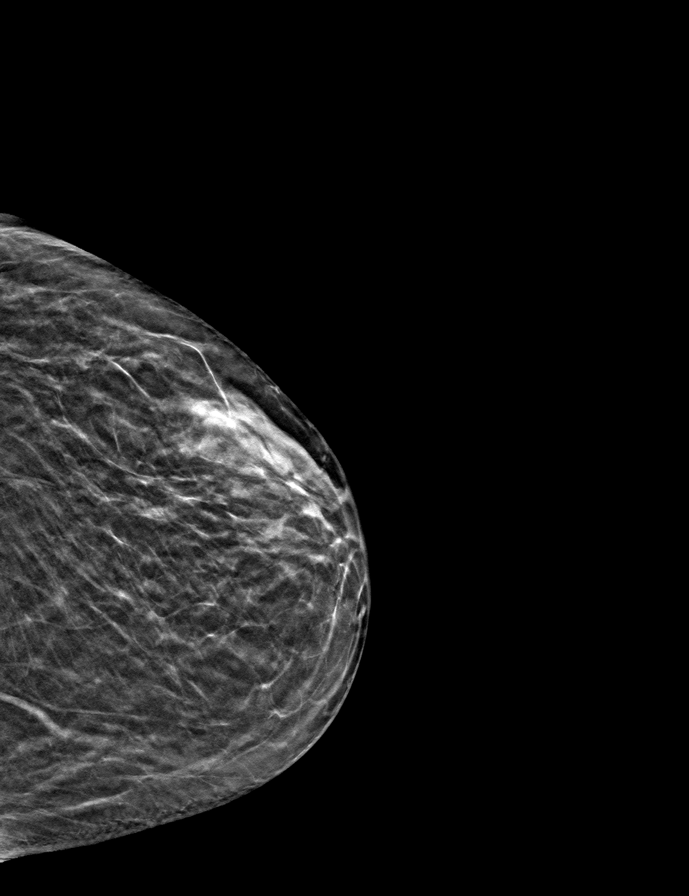
[frame 21/40]
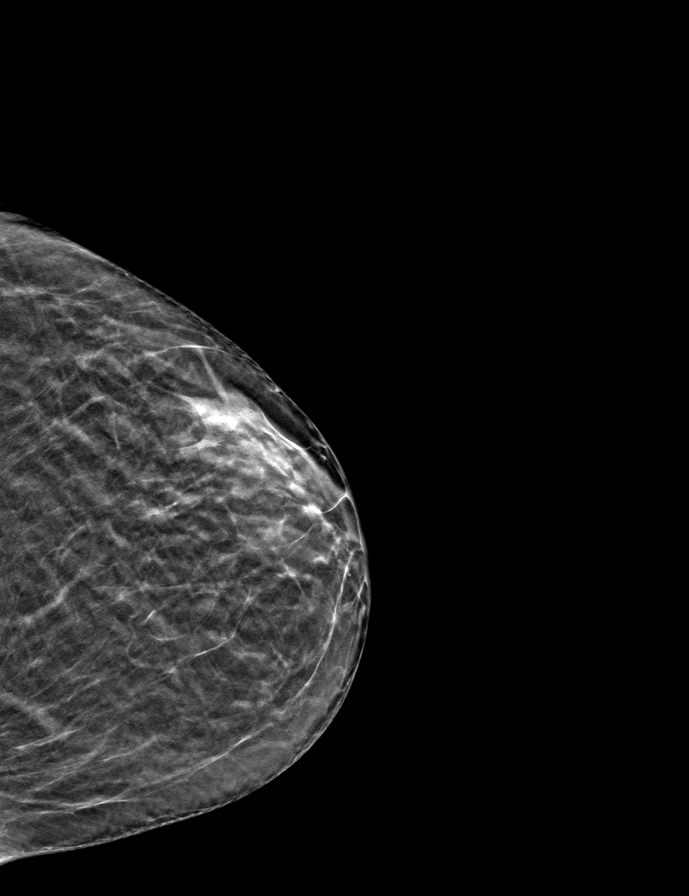

[R MLO tomo · tomo slice 18/35.0]
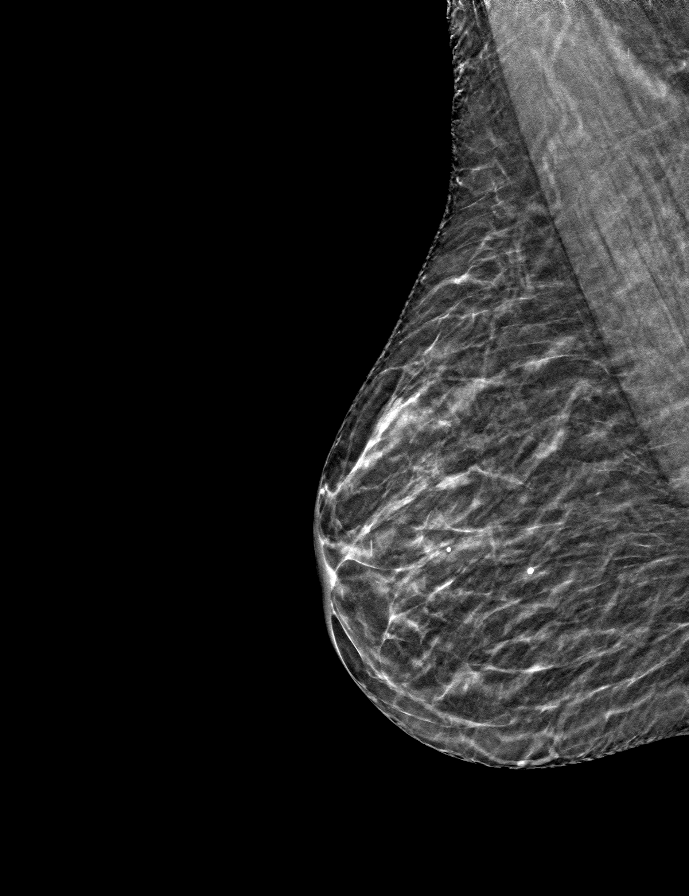

[L MLO tomo · tomo slice 21/40.0]
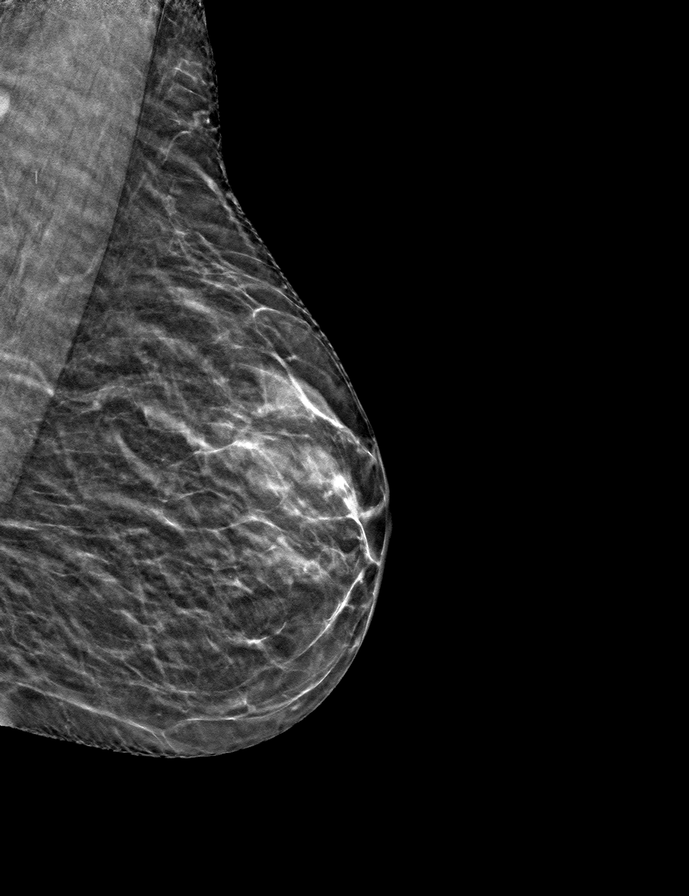

[R CC tomo · tomo slice 18/35.0]
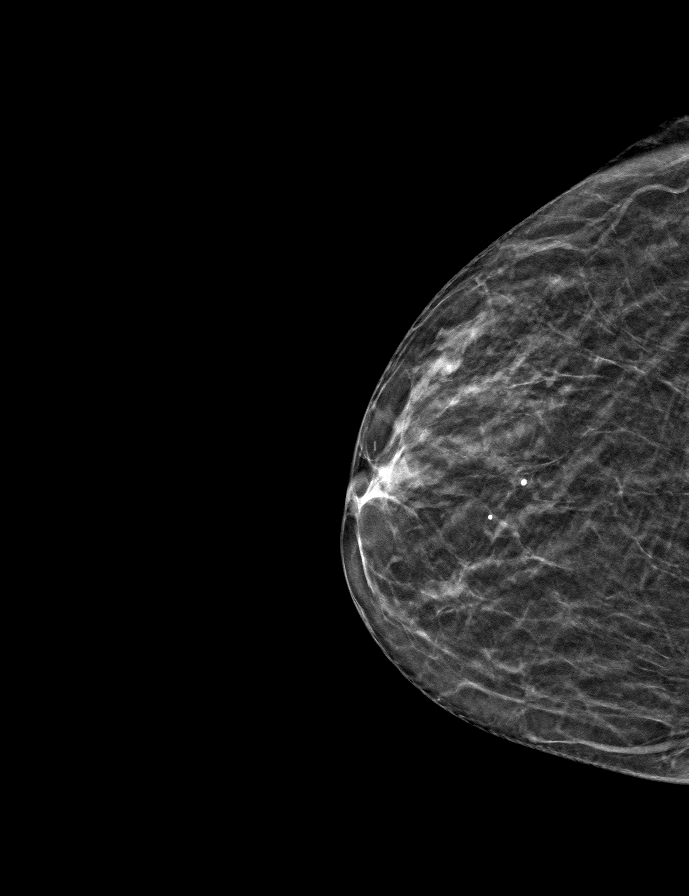

[9 of 24 positions shown; findings below may reference images not displayed]

ACR Breast Density Category b: There are scattered areas of
fibroglandular density.
FINDINGS: There are no findings suspicious for malignancy.
IMPRESSION: No mammographic evidence of malignancy. A result letter of this
screening mammogram will be mailed directly to the patient.

RECOMMENDATION:
Screening mammogram in one year. (Code:51-O-LD2)

BI-RADS CATEGORY  1: Negative.
